# Patient Record
Sex: Female | Born: 1940
Health system: Southern US, Community
[De-identification: ages and names within clinical notes are randomized; demographics above are authoritative.]

## PROBLEM LIST (undated history)

## (undated) DIAGNOSIS — E039 Hypothyroidism, unspecified: Secondary | ICD-10-CM

## (undated) DIAGNOSIS — M1990 Unspecified osteoarthritis, unspecified site: Secondary | ICD-10-CM

## (undated) DIAGNOSIS — Z8711 Personal history of peptic ulcer disease: Secondary | ICD-10-CM

## (undated) DIAGNOSIS — I10 Essential (primary) hypertension: Secondary | ICD-10-CM

## (undated) DIAGNOSIS — E119 Type 2 diabetes mellitus without complications: Secondary | ICD-10-CM

## (undated) DIAGNOSIS — Z8719 Personal history of other diseases of the digestive system: Secondary | ICD-10-CM

## (undated) DIAGNOSIS — E785 Hyperlipidemia, unspecified: Secondary | ICD-10-CM

## (undated) DIAGNOSIS — C189 Malignant neoplasm of colon, unspecified: Secondary | ICD-10-CM

## (undated) DIAGNOSIS — H269 Unspecified cataract: Secondary | ICD-10-CM

## (undated) HISTORY — DX: Hyperlipidemia, unspecified: E78.5

## (undated) HISTORY — PX: REPAIR OF PERFORATED ULCER: SHX6065

## (undated) HISTORY — PX: COLONOSCOPY: SHX174

## (undated) HISTORY — PX: COLON SURGERY: SHX602

## (undated) HISTORY — DX: Type 2 diabetes mellitus without complications: E11.9

## (undated) HISTORY — PX: TOTAL THYROIDECTOMY: SHX2547

## (undated) HISTORY — DX: Unspecified cataract: H26.9

---

## 2000-07-03 ENCOUNTER — Encounter: Payer: Self-pay | Admitting: Family Medicine

## 2000-07-03 ENCOUNTER — Ambulatory Visit (HOSPITAL_COMMUNITY): Admission: RE | Admit: 2000-07-03 | Discharge: 2000-07-03 | Payer: Self-pay | Admitting: Family Medicine

## 2004-01-13 ENCOUNTER — Ambulatory Visit: Payer: Self-pay | Admitting: *Deleted

## 2004-04-16 ENCOUNTER — Ambulatory Visit: Payer: Self-pay | Admitting: Family Medicine

## 2004-04-25 ENCOUNTER — Ambulatory Visit: Payer: Self-pay | Admitting: Family Medicine

## 2004-04-26 ENCOUNTER — Ambulatory Visit (HOSPITAL_COMMUNITY): Admission: RE | Admit: 2004-04-26 | Discharge: 2004-04-26 | Payer: Self-pay | Admitting: Internal Medicine

## 2004-04-27 ENCOUNTER — Ambulatory Visit: Payer: Self-pay | Admitting: Internal Medicine

## 2004-04-29 HISTORY — PX: CRANIOTOMY FOR TUMOR: SUR345

## 2004-05-03 ENCOUNTER — Ambulatory Visit: Payer: Self-pay | Admitting: Family Medicine

## 2004-05-18 ENCOUNTER — Encounter (INDEPENDENT_AMBULATORY_CARE_PROVIDER_SITE_OTHER): Payer: Self-pay | Admitting: Specialist

## 2004-05-18 ENCOUNTER — Inpatient Hospital Stay (HOSPITAL_COMMUNITY): Admission: RE | Admit: 2004-05-18 | Discharge: 2004-05-23 | Payer: Self-pay | Admitting: Neurosurgery

## 2004-06-25 ENCOUNTER — Ambulatory Visit: Payer: Self-pay | Admitting: Family Medicine

## 2004-07-12 ENCOUNTER — Ambulatory Visit: Payer: Self-pay | Admitting: Internal Medicine

## 2004-07-27 ENCOUNTER — Ambulatory Visit (HOSPITAL_COMMUNITY): Admission: RE | Admit: 2004-07-27 | Discharge: 2004-07-27 | Payer: Self-pay | Admitting: Neurosurgery

## 2004-10-08 ENCOUNTER — Ambulatory Visit: Payer: Self-pay | Admitting: Family Medicine

## 2004-10-09 ENCOUNTER — Ambulatory Visit (HOSPITAL_COMMUNITY): Admission: RE | Admit: 2004-10-09 | Discharge: 2004-10-09 | Payer: Self-pay

## 2005-03-04 ENCOUNTER — Ambulatory Visit: Payer: Self-pay | Admitting: Family Medicine

## 2005-05-28 ENCOUNTER — Ambulatory Visit: Payer: Self-pay | Admitting: Family Medicine

## 2005-07-03 ENCOUNTER — Ambulatory Visit: Payer: Self-pay | Admitting: Family Medicine

## 2005-07-29 ENCOUNTER — Ambulatory Visit: Payer: Self-pay | Admitting: Family Medicine

## 2005-08-26 ENCOUNTER — Ambulatory Visit: Payer: Self-pay | Admitting: Internal Medicine

## 2005-10-22 ENCOUNTER — Ambulatory Visit: Payer: Self-pay | Admitting: Family Medicine

## 2005-10-24 ENCOUNTER — Ambulatory Visit (HOSPITAL_COMMUNITY): Admission: RE | Admit: 2005-10-24 | Discharge: 2005-10-24 | Payer: Self-pay | Admitting: Family Medicine

## 2005-11-05 ENCOUNTER — Ambulatory Visit: Payer: Self-pay | Admitting: Family Medicine

## 2005-11-06 ENCOUNTER — Ambulatory Visit: Payer: Self-pay | Admitting: Family Medicine

## 2005-11-21 ENCOUNTER — Ambulatory Visit: Payer: Self-pay | Admitting: Family Medicine

## 2005-11-27 ENCOUNTER — Encounter (INDEPENDENT_AMBULATORY_CARE_PROVIDER_SITE_OTHER): Payer: Self-pay | Admitting: Family Medicine

## 2005-11-27 LAB — CONVERTED CEMR LAB

## 2006-01-02 ENCOUNTER — Ambulatory Visit: Payer: Self-pay | Admitting: Family Medicine

## 2006-01-09 ENCOUNTER — Ambulatory Visit (HOSPITAL_COMMUNITY): Admission: RE | Admit: 2006-01-09 | Discharge: 2006-01-09 | Payer: Self-pay | Admitting: Internal Medicine

## 2006-03-24 ENCOUNTER — Ambulatory Visit: Payer: Self-pay | Admitting: Family Medicine

## 2006-06-26 ENCOUNTER — Ambulatory Visit: Payer: Self-pay | Admitting: Family Medicine

## 2006-09-04 ENCOUNTER — Ambulatory Visit: Payer: Self-pay | Admitting: Family Medicine

## 2006-10-06 ENCOUNTER — Ambulatory Visit: Payer: Self-pay | Admitting: Family Medicine

## 2006-10-06 DIAGNOSIS — D320 Benign neoplasm of cerebral meninges: Secondary | ICD-10-CM | POA: Insufficient documentation

## 2006-10-06 DIAGNOSIS — H401190 Primary open-angle glaucoma, unspecified eye, stage unspecified: Secondary | ICD-10-CM | POA: Insufficient documentation

## 2006-10-06 DIAGNOSIS — E039 Hypothyroidism, unspecified: Secondary | ICD-10-CM | POA: Insufficient documentation

## 2006-10-06 DIAGNOSIS — I10 Essential (primary) hypertension: Secondary | ICD-10-CM | POA: Insufficient documentation

## 2006-10-06 DIAGNOSIS — E119 Type 2 diabetes mellitus without complications: Secondary | ICD-10-CM

## 2006-10-06 DIAGNOSIS — Z9089 Acquired absence of other organs: Secondary | ICD-10-CM

## 2006-10-06 DIAGNOSIS — H4011X0 Primary open-angle glaucoma, stage unspecified: Secondary | ICD-10-CM

## 2006-10-06 DIAGNOSIS — M1710 Unilateral primary osteoarthritis, unspecified knee: Secondary | ICD-10-CM

## 2007-01-14 ENCOUNTER — Encounter (INDEPENDENT_AMBULATORY_CARE_PROVIDER_SITE_OTHER): Payer: Self-pay | Admitting: *Deleted

## 2007-03-09 ENCOUNTER — Ambulatory Visit: Payer: Self-pay | Admitting: Internal Medicine

## 2007-03-09 LAB — CONVERTED CEMR LAB
ALT: 15 units/L (ref 0–35)
BUN: 14 mg/dL (ref 6–23)
Basophils Absolute: 0 10*3/uL (ref 0.0–0.1)
CO2: 25 meq/L (ref 19–32)
Cholesterol: 165 mg/dL (ref 0–200)
Creatinine, Ser: 0.74 mg/dL (ref 0.40–1.20)
Eosinophils Relative: 4 % (ref 0–5)
Glucose, Bld: 92 mg/dL (ref 70–99)
HCT: 37.5 % (ref 36.0–46.0)
HDL: 41 mg/dL (ref >39)
Hemoglobin: 12 g/dL (ref 12.0–15.0)
Lymphocytes Relative: 48 % — ABNORMAL HIGH (ref 12–46)
Monocytes Absolute: 0.4 10*3/uL (ref 0.1–1.0)
Monocytes Relative: 10 % (ref 3–12)
RBC: 4.2 M/uL (ref 3.87–5.11)
RDW: 13.4 % (ref 11.5–15.5)
Total Bilirubin: 0.4 mg/dL (ref 0.3–1.2)
Total CHOL/HDL Ratio: 4
Triglycerides: 306 mg/dL — ABNORMAL HIGH (ref <150)
VLDL: 61 mg/dL — ABNORMAL HIGH (ref 0–40)

## 2007-08-04 ENCOUNTER — Ambulatory Visit: Payer: Self-pay | Admitting: Internal Medicine

## 2007-08-04 ENCOUNTER — Encounter (INDEPENDENT_AMBULATORY_CARE_PROVIDER_SITE_OTHER): Payer: Self-pay | Admitting: Family Medicine

## 2007-10-13 ENCOUNTER — Ambulatory Visit: Payer: Self-pay | Admitting: Family Medicine

## 2007-11-23 ENCOUNTER — Ambulatory Visit: Payer: Self-pay | Admitting: Family Medicine

## 2007-11-23 ENCOUNTER — Encounter (INDEPENDENT_AMBULATORY_CARE_PROVIDER_SITE_OTHER): Payer: Self-pay | Admitting: Family Medicine

## 2007-11-23 LAB — CONVERTED CEMR LAB
BUN: 16 mg/dL (ref 6–23)
CO2: 25 meq/L (ref 19–32)
Calcium: 9.1 mg/dL (ref 8.4–10.5)
Cholesterol: 154 mg/dL (ref 0–200)
Creatinine, Ser: 0.83 mg/dL (ref 0.40–1.20)
Glucose, Bld: 107 mg/dL — ABNORMAL HIGH (ref 70–99)

## 2007-11-30 ENCOUNTER — Ambulatory Visit (HOSPITAL_COMMUNITY): Admission: RE | Admit: 2007-11-30 | Discharge: 2007-11-30 | Payer: Self-pay | Admitting: Family Medicine

## 2008-02-10 ENCOUNTER — Ambulatory Visit: Payer: Self-pay | Admitting: Internal Medicine

## 2008-02-10 ENCOUNTER — Encounter: Payer: Self-pay | Admitting: Family Medicine

## 2008-02-10 LAB — CONVERTED CEMR LAB
BUN: 11 mg/dL (ref 6–23)
Chloride: 100 meq/L (ref 96–112)
Glucose, Bld: 98 mg/dL (ref 70–99)
Potassium: 3.6 meq/L (ref 3.5–5.3)
Sodium: 140 meq/L (ref 135–145)
TSH: 4.657 microintl units/mL — ABNORMAL HIGH (ref 0.350–4.50)

## 2008-03-07 ENCOUNTER — Ambulatory Visit: Payer: Self-pay | Admitting: Family Medicine

## 2008-04-19 ENCOUNTER — Ambulatory Visit: Payer: Self-pay | Admitting: Family Medicine

## 2008-04-19 LAB — CONVERTED CEMR LAB
AST: 15 units/L (ref 0–37)
Albumin: 4 g/dL (ref 3.5–5.2)
BUN: 19 mg/dL (ref 6–23)
Basophils Absolute: 0 10*3/uL (ref 0.0–0.1)
Calcium: 8.8 mg/dL (ref 8.4–10.5)
Chloride: 102 meq/L (ref 96–112)
Creatinine, Ser: 0.79 mg/dL (ref 0.40–1.20)
Eosinophils Relative: 3 % (ref 0–5)
Glucose, Bld: 188 mg/dL — ABNORMAL HIGH (ref 70–99)
HCT: 38.1 % (ref 36.0–46.0)
Hemoglobin: 11.8 g/dL — ABNORMAL LOW (ref 12.0–15.0)
Lymphocytes Relative: 28 % (ref 12–46)
MCHC: 31 g/dL (ref 30.0–36.0)
Monocytes Absolute: 0.4 10*3/uL (ref 0.1–1.0)
Monocytes Relative: 8 % (ref 3–12)
Neutro Abs: 2.8 10*3/uL (ref 1.7–7.7)
Potassium: 3.8 meq/L (ref 3.5–5.3)
RBC: 4.15 M/uL (ref 3.87–5.11)
RDW: 13.1 % (ref 11.5–15.5)
TSH: 5.506 microintl units/mL — ABNORMAL HIGH (ref 0.350–4.50)

## 2008-05-04 ENCOUNTER — Ambulatory Visit: Payer: Self-pay | Admitting: Internal Medicine

## 2008-06-13 ENCOUNTER — Encounter: Payer: Self-pay | Admitting: Family Medicine

## 2008-06-13 ENCOUNTER — Ambulatory Visit: Payer: Self-pay | Admitting: Internal Medicine

## 2008-06-13 LAB — CONVERTED CEMR LAB
TSH: 1.12 microintl units/mL (ref 0.350–4.50)
Vit D, 25-Hydroxy: 15 ng/mL — ABNORMAL LOW (ref 30–89)

## 2008-07-25 ENCOUNTER — Ambulatory Visit: Payer: Self-pay | Admitting: Internal Medicine

## 2008-08-22 ENCOUNTER — Ambulatory Visit: Payer: Self-pay | Admitting: Internal Medicine

## 2008-10-03 ENCOUNTER — Encounter: Payer: Self-pay | Admitting: Family Medicine

## 2008-10-03 ENCOUNTER — Ambulatory Visit: Payer: Self-pay | Admitting: Internal Medicine

## 2008-10-03 LAB — CONVERTED CEMR LAB: Microalb, Ur: 1.03 mg/dL (ref 0.00–1.89)

## 2008-12-21 ENCOUNTER — Ambulatory Visit (HOSPITAL_COMMUNITY): Admission: RE | Admit: 2008-12-21 | Discharge: 2008-12-21 | Payer: Self-pay | Admitting: Internal Medicine

## 2009-01-16 ENCOUNTER — Encounter: Payer: Self-pay | Admitting: Family Medicine

## 2009-01-16 ENCOUNTER — Ambulatory Visit: Payer: Self-pay | Admitting: Internal Medicine

## 2009-01-16 LAB — CONVERTED CEMR LAB
AST: 16 units/L (ref 0–37)
BUN: 14 mg/dL (ref 6–23)
Basophils Absolute: 0 10*3/uL (ref 0.0–0.1)
Basophils Relative: 0 % (ref 0–1)
Calcium: 9 mg/dL (ref 8.4–10.5)
Chloride: 103 meq/L (ref 96–112)
Creatinine, Ser: 0.76 mg/dL (ref 0.40–1.20)
Eosinophils Absolute: 0.1 10*3/uL (ref 0.0–0.7)
MCHC: 32.1 g/dL (ref 30.0–36.0)
MCV: 89.5 fL (ref 78.0–100.0)
Monocytes Relative: 13 % — ABNORMAL HIGH (ref 3–12)
Neutro Abs: 1.7 10*3/uL (ref 1.7–7.7)
Neutrophils Relative %: 41 % — ABNORMAL LOW (ref 43–77)
Platelets: 192 10*3/uL (ref 150–400)
RDW: 12.6 % (ref 11.5–15.5)
Sed Rate: 14 mm/hr (ref 0–22)
Total Bilirubin: 0.4 mg/dL (ref 0.3–1.2)

## 2009-01-23 ENCOUNTER — Ambulatory Visit: Payer: Self-pay | Admitting: Internal Medicine

## 2009-02-02 ENCOUNTER — Ambulatory Visit: Payer: Self-pay | Admitting: Family Medicine

## 2009-05-03 ENCOUNTER — Ambulatory Visit: Payer: Self-pay | Admitting: Internal Medicine

## 2009-07-20 ENCOUNTER — Ambulatory Visit: Payer: Self-pay | Admitting: Internal Medicine

## 2009-10-02 ENCOUNTER — Ambulatory Visit: Payer: Self-pay | Admitting: Internal Medicine

## 2009-10-02 LAB — CONVERTED CEMR LAB
ALT: 15 units/L (ref 0–35)
CO2: 25 meq/L (ref 19–32)
Calcium: 8.8 mg/dL (ref 8.4–10.5)
Chloride: 105 meq/L (ref 96–112)
Cholesterol: 153 mg/dL (ref 0–200)
Creatinine, Ser: 0.67 mg/dL (ref 0.40–1.20)
Glucose, Bld: 135 mg/dL — ABNORMAL HIGH (ref 70–99)
Total Protein: 8.2 g/dL (ref 6.0–8.3)
Triglycerides: 107 mg/dL (ref <150)

## 2009-10-20 ENCOUNTER — Ambulatory Visit: Payer: Self-pay | Admitting: Internal Medicine

## 2009-10-31 ENCOUNTER — Ambulatory Visit: Payer: Self-pay | Admitting: Internal Medicine

## 2009-12-25 ENCOUNTER — Ambulatory Visit (HOSPITAL_COMMUNITY): Admission: RE | Admit: 2009-12-25 | Discharge: 2009-12-25 | Payer: Self-pay | Admitting: Family Medicine

## 2010-09-14 NOTE — Op Note (Signed)
Heather [name]:  [id number]   MEDICAL RECORD NO.:  [id number]          PATIENT TYPE:  INP   LOCATION:  2899                         FACILITY:  [facility]   PHYSICIAN:  Danae Orleans. Venetia Maxon, M.D.  DATE OF BIRTH:  07-May-1940   DATE OF PROCEDURE:  05/18/2004  DATE OF DISCHARGE:                                 OPERATIVE REPORT   PREOPERATIVE DIAGNOSIS:  Left sphenoid wing meningioma.   POSTOPERATIVE DIAGNOSIS:  Left sphenoid wing meningioma.   PROCEDURE:  Left temporal craniotomy for meningioma with microdissection.   SURGEON:  Danae Orleans. Venetia Maxon, M.D.   ASSISTANT:  Hewitt Shorts, M.D.   ANESTHESIA:  General endotracheal anesthesia.   ESTIMATED BLOOD LOSS:  600 mL.   COMPLICATIONS:  None.   DISPOSITION:  To recovery.   INDICATIONS:  Heather Wilcox is a 71 year old woman with hypertension with  left facial droop and the beginning of ptosis, who had an MRI of her brain,  which demonstrated a large sphenoid wing meningioma on the left.  In  addition, she has high blood pressure and no other significant medical  problems.   It was elected to take Heather Wilcox to surgery for removal of the left  sphenoid wing meningioma.   PROCEDURE:  Heather Wilcox was brought to the operating room.  Following the  satisfactory and uncomplicated induction of general endotracheal anesthesia  and placement of intravenous lines and arterial line, the patient was placed  in three-pin head fixation with the left pterion uppermost and with a  blanket roll under her left shoulder.  Her left frontotemporal region was  then prepped and draped in the usual sterile fashion.  The area of planned  incision was infiltrated with 0.25% Marcaine and 0.5% lidocaine and  1:200,000 epinephrine.  Incision was made from just in front of the ear to  overlying the frontal scalp, carried through the temporalis fascia, and the  temporalis muscle was also incised and moved and dissected forward en  bloc  with the scalp flap.  Towel clips were placed to facilitate exposure.  Three  bur holes were placed, one over the temporal squama, one at the keyhole, and  one over the superior temporal line posteriorly, and a bone flap was turned.  The bone was quite vascular, and bleeding was controlled with bone wax.  A  very hypertrophied middle meningeal artery was cauterized with bipolar  electrocautery.  Subsequently, the temporal tip dura was exposed with  craniectomy to the floor of the middle fossa.  There were some pneumatized  air cells over the inferior edge of bone resection along the anterior aspect  near the middle fossa, and these were extensively waxed.  Using a high-speed  drill and a Architectural technologist, the hyperostotic sphenoid  wing was drilled down.  Bone wax was used to facilitate hemostasis.  Eventually the sphenoid wing was taken down satisfactorily extradurally, and  subsequently the dura was opened in a curvilinear fashion and reflected  forward.  The Budde halo was assembled and a temporal and frontal retractor  blade were used to facilitate exposure.  There was a filmy arachnoid and  multiple small vascular connections to the tumor.  These were taken down  with bipolar electrocautery and microscissors.  The tumor capsule was  identified.  It was quite soft, and multiple biopsies were taken.  Using  suction cautery technique, the tumor capsule was mobilized and the tumor was  debulked internally.  There was a middle cerebral artery, which was adherent  to the capsule and appeared to be __________, and this was very carefully  dissected free and preserved.  There were several branches directly into the  tumor, which were cauterized with bipolar electrocautery and taken.  The  tumor was then removed completely.  The dural attachment was then cauterized  with bipolar electrocautery.  The hemostasis was assured, and the cavity of  the tumor resection was lined  with Surgicel.  The dura was then closed with  4-0 Nurolon stitches.  Dural tack-up stitches were placed.  A piece of  Gelfoam was placed over the dura.  The bone flap was then repositioned with  Osteomed plates and screws.  The temporalis fascia was reapproximated with 2-  0 Vicryl sutures.  The galea was reapproximated with 2-0 Vicryl interrupted,  inverted sutures.  The skin edges were reapproximated with staples.  A  sterile occlusive dressing was placed along with head wrap.  The patient was  extubated in the operating room and taken to the recovery room in stable and  satisfactory condition, having tolerated her operation well.  Counts correct  at the end of the case.      JDS/MEDQ  D:  05/18/2004  T:  05/18/2004  Job:  [id number]

## 2010-09-14 NOTE — Procedures (Signed)
CONCLUSION:  Normal EEG in the waking and drowsy states without seizure  activity or focal abnormality during the course of today's recording; EKG  revealed a mild bradycardia in the low 60s. Clinical correlation is  recommended.       ZOX:WRUE  D:  10/09/2004 12:30:39  T:  10/09/2004 13:34:47  Job #:  [id number]   cc:   Tresa Endo L. Philipp Deputy, M.D.  [phone] [address] [place]  [place] [address]  Fax: [phone]

## 2010-09-14 NOTE — Discharge Summary (Signed)
[name], [name]:  [id number]   MEDICAL RECORD NO.:  [id number]          PATIENT TYPE:  INP   LOCATION:  3031                         FACILITY:  [facility]   PHYSICIAN:  Danae Orleans. Venetia Maxon, M.D.  DATE OF BIRTH:  [birth year]   DATE OF ADMISSION:  05/18/2004  DATE OF DISCHARGE:  05/23/2004                                 DISCHARGE SUMMARY   REASON FOR ADMISSION:  Benign cerebral neoplasm of the meninges  (meningioma).   ADDITIONAL DIAGNOSES:  1.  Tobacco use disorder.  2.  Hypertension.  3.  History of penicillin allergy.  4.  Facial weakness.  5.  Esophageal reflux.  6.  Ptosis of the eyelid.   FINAL DIAGNOSES:  1.  Benign cerebral neoplasm of the meninges (meningioma).  2.  Tobacco use disorder.  3.  Hypertension.  4.  History of penicillin allergy.  5.  Facial weakness.  6.  Esophageal reflux.  7.  Ptosis of the eyelid.   HISTORY OF PRESENT ILLNESS:  Heather Wilcox is a 70 year old woman who was  noted to have a left sphenoid wing meningioma.  She has a two-year history  of left-sided facial twitching and sagging.  She says that her eye jumps.  She had an MRI of her brain, which showed that she had a left sphenoid wing  meningioma measuring approximately 3 cm in diameter with a fair amount of  peritumoral edema.   ALLERGIES:  She has a history of PENICILLIN allergy.   MEDICATIONS:  She takes Prinizide, Surveyor, mining and metoprolol.  She takes  reserpine for elevated blood pressure.  She was started on a dexamethasone  taper when the tumor was identified and was also started on Dilantin for  prophylaxis against potential seizure.   HOSPITAL COURSE:  The patient was admitted to the hospital and taken to  surgery on a same day as procedure basis for craniotomy for left sphenoid  wing meningioma.  She tolerated the surgical procedure well, was gradually  mobilized and had intact vision.  No cranial nerve deficits postoperatively.  She was gradually mobilized  and was doing well.   DISPOSITION:  She was then discharged from the 3000 inpatient floor unit on  May 23, 2004.   DISCHARGE MEDICATIONS:  Dilantin, Percocet and Decadron taper.   FOLLOWUP:  Instructions to follow up with Dr. Venetia Maxon in the office in one  week for suture removal.      JDS/MEDQ  D:  07/19/2004  T:  07/19/2004  Job:  [id number]

## 2010-12-24 ENCOUNTER — Other Ambulatory Visit (HOSPITAL_COMMUNITY): Payer: Self-pay | Admitting: Family Medicine

## 2010-12-24 DIAGNOSIS — Z1231 Encounter for screening mammogram for malignant neoplasm of breast: Secondary | ICD-10-CM

## 2010-12-28 ENCOUNTER — Ambulatory Visit (HOSPITAL_COMMUNITY)
Admission: RE | Admit: 2010-12-28 | Discharge: 2010-12-28 | Disposition: A | Discharge status: Home / Self Care | Payer: PRIVATE HEALTH INSURANCE | Source: Ambulatory Visit | Attending: Family Medicine | Admitting: Family Medicine

## 2010-12-28 DIAGNOSIS — Z1231 Encounter for screening mammogram for malignant neoplasm of breast: Secondary | ICD-10-CM | POA: Insufficient documentation

## 2011-12-12 ENCOUNTER — Other Ambulatory Visit (HOSPITAL_COMMUNITY): Payer: Self-pay | Admitting: Family Medicine

## 2011-12-12 DIAGNOSIS — Z1231 Encounter for screening mammogram for malignant neoplasm of breast: Secondary | ICD-10-CM

## 2012-01-03 ENCOUNTER — Ambulatory Visit (HOSPITAL_COMMUNITY)
Admission: RE | Admit: 2012-01-03 | Discharge: 2012-01-03 | Disposition: A | Discharge status: Home / Self Care | Payer: PRIVATE HEALTH INSURANCE | Source: Ambulatory Visit | Attending: Family Medicine | Admitting: Family Medicine

## 2012-01-03 DIAGNOSIS — Z1231 Encounter for screening mammogram for malignant neoplasm of breast: Secondary | ICD-10-CM | POA: Insufficient documentation

## 2012-12-10 ENCOUNTER — Other Ambulatory Visit (HOSPITAL_COMMUNITY): Payer: Self-pay | Admitting: Internal Medicine

## 2012-12-10 DIAGNOSIS — Z1231 Encounter for screening mammogram for malignant neoplasm of breast: Secondary | ICD-10-CM

## 2013-01-04 ENCOUNTER — Ambulatory Visit (HOSPITAL_COMMUNITY)
Admission: RE | Admit: 2013-01-04 | Discharge: 2013-01-04 | Disposition: A | Discharge status: Home / Self Care | Payer: PRIVATE HEALTH INSURANCE | Source: Ambulatory Visit | Attending: Internal Medicine | Admitting: Internal Medicine

## 2013-01-04 ENCOUNTER — Other Ambulatory Visit (HOSPITAL_COMMUNITY): Payer: Self-pay | Admitting: Internal Medicine

## 2013-01-04 DIAGNOSIS — Z1231 Encounter for screening mammogram for malignant neoplasm of breast: Secondary | ICD-10-CM | POA: Insufficient documentation

## 2013-12-06 ENCOUNTER — Other Ambulatory Visit (HOSPITAL_COMMUNITY): Payer: Self-pay | Admitting: Internal Medicine

## 2013-12-06 DIAGNOSIS — Z1231 Encounter for screening mammogram for malignant neoplasm of breast: Secondary | ICD-10-CM

## 2014-01-05 ENCOUNTER — Ambulatory Visit (HOSPITAL_COMMUNITY)
Admission: RE | Admit: 2014-01-05 | Discharge: 2014-01-05 | Disposition: A | Discharge status: Home / Self Care | Payer: PRIVATE HEALTH INSURANCE | Source: Ambulatory Visit | Attending: Internal Medicine | Admitting: Internal Medicine

## 2014-01-05 DIAGNOSIS — Z1231 Encounter for screening mammogram for malignant neoplasm of breast: Secondary | ICD-10-CM | POA: Diagnosis present

## 2014-05-27 DIAGNOSIS — H35363 Drusen (degenerative) of macula, bilateral: Secondary | ICD-10-CM | POA: Diagnosis not present

## 2014-06-07 DIAGNOSIS — E7211 Homocystinuria: Secondary | ICD-10-CM | POA: Diagnosis not present

## 2014-06-07 DIAGNOSIS — I119 Hypertensive heart disease without heart failure: Secondary | ICD-10-CM | POA: Diagnosis not present

## 2014-06-07 DIAGNOSIS — I10 Essential (primary) hypertension: Secondary | ICD-10-CM | POA: Diagnosis not present

## 2014-06-07 DIAGNOSIS — E039 Hypothyroidism, unspecified: Secondary | ICD-10-CM | POA: Diagnosis not present

## 2014-06-07 DIAGNOSIS — R7309 Other abnormal glucose: Secondary | ICD-10-CM | POA: Diagnosis not present

## 2014-06-07 DIAGNOSIS — Z0000 Encounter for general adult medical examination without abnormal findings: Secondary | ICD-10-CM | POA: Diagnosis not present

## 2014-07-20 DIAGNOSIS — N760 Acute vaginitis: Secondary | ICD-10-CM | POA: Diagnosis not present

## 2014-07-20 DIAGNOSIS — I10 Essential (primary) hypertension: Secondary | ICD-10-CM | POA: Diagnosis not present

## 2014-07-20 DIAGNOSIS — Z124 Encounter for screening for malignant neoplasm of cervix: Secondary | ICD-10-CM | POA: Diagnosis not present

## 2014-07-20 DIAGNOSIS — R7309 Other abnormal glucose: Secondary | ICD-10-CM | POA: Diagnosis not present

## 2014-07-20 DIAGNOSIS — I119 Hypertensive heart disease without heart failure: Secondary | ICD-10-CM | POA: Diagnosis not present

## 2014-07-20 DIAGNOSIS — L0390 Cellulitis, unspecified: Secondary | ICD-10-CM | POA: Diagnosis not present

## 2014-10-27 DIAGNOSIS — E039 Hypothyroidism, unspecified: Secondary | ICD-10-CM | POA: Diagnosis not present

## 2014-10-27 DIAGNOSIS — I10 Essential (primary) hypertension: Secondary | ICD-10-CM | POA: Diagnosis not present

## 2014-10-27 DIAGNOSIS — E7211 Homocystinuria: Secondary | ICD-10-CM | POA: Diagnosis not present

## 2014-10-27 DIAGNOSIS — R7309 Other abnormal glucose: Secondary | ICD-10-CM | POA: Diagnosis not present

## 2014-10-27 DIAGNOSIS — I119 Hypertensive heart disease without heart failure: Secondary | ICD-10-CM | POA: Diagnosis not present

## 2014-12-20 ENCOUNTER — Other Ambulatory Visit (HOSPITAL_COMMUNITY): Payer: Self-pay | Admitting: Internal Medicine

## 2014-12-20 DIAGNOSIS — Z1231 Encounter for screening mammogram for malignant neoplasm of breast: Secondary | ICD-10-CM

## 2015-01-09 ENCOUNTER — Ambulatory Visit (HOSPITAL_COMMUNITY): Payer: Medicare Other

## 2015-01-16 ENCOUNTER — Ambulatory Visit (HOSPITAL_COMMUNITY)
Admission: RE | Admit: 2015-01-16 | Discharge: 2015-01-16 | Disposition: A | Discharge status: Home / Self Care | Payer: Medicaid Other | Source: Ambulatory Visit | Attending: Internal Medicine | Admitting: Internal Medicine

## 2015-01-16 DIAGNOSIS — Z1231 Encounter for screening mammogram for malignant neoplasm of breast: Secondary | ICD-10-CM | POA: Diagnosis not present

## 2015-01-18 DIAGNOSIS — E039 Hypothyroidism, unspecified: Secondary | ICD-10-CM | POA: Diagnosis not present

## 2015-01-18 DIAGNOSIS — I10 Essential (primary) hypertension: Secondary | ICD-10-CM | POA: Diagnosis not present

## 2015-01-18 DIAGNOSIS — I119 Hypertensive heart disease without heart failure: Secondary | ICD-10-CM | POA: Diagnosis not present

## 2015-01-18 DIAGNOSIS — R7309 Other abnormal glucose: Secondary | ICD-10-CM | POA: Diagnosis not present

## 2015-01-18 DIAGNOSIS — E7211 Homocystinuria: Secondary | ICD-10-CM | POA: Diagnosis not present

## 2015-01-18 DIAGNOSIS — Z720 Tobacco use: Secondary | ICD-10-CM | POA: Diagnosis not present

## 2015-01-18 DIAGNOSIS — Z7689 Persons encountering health services in other specified circumstances: Secondary | ICD-10-CM | POA: Diagnosis not present

## 2015-04-19 DIAGNOSIS — H1089 Other conjunctivitis: Secondary | ICD-10-CM | POA: Diagnosis not present

## 2015-04-19 DIAGNOSIS — E7211 Homocystinuria: Secondary | ICD-10-CM | POA: Diagnosis not present

## 2015-04-19 DIAGNOSIS — J309 Allergic rhinitis, unspecified: Secondary | ICD-10-CM | POA: Diagnosis not present

## 2015-04-19 DIAGNOSIS — I10 Essential (primary) hypertension: Secondary | ICD-10-CM | POA: Diagnosis not present

## 2015-04-19 DIAGNOSIS — E119 Type 2 diabetes mellitus without complications: Secondary | ICD-10-CM | POA: Diagnosis not present

## 2015-04-19 DIAGNOSIS — E559 Vitamin D deficiency, unspecified: Secondary | ICD-10-CM | POA: Diagnosis not present

## 2015-05-10 DIAGNOSIS — Z1389 Encounter for screening for other disorder: Secondary | ICD-10-CM | POA: Diagnosis not present

## 2015-05-10 DIAGNOSIS — E7211 Homocystinuria: Secondary | ICD-10-CM | POA: Diagnosis not present

## 2015-05-10 DIAGNOSIS — E119 Type 2 diabetes mellitus without complications: Secondary | ICD-10-CM | POA: Diagnosis not present

## 2015-05-10 DIAGNOSIS — Z0000 Encounter for general adult medical examination without abnormal findings: Secondary | ICD-10-CM | POA: Diagnosis not present

## 2015-05-10 DIAGNOSIS — Z131 Encounter for screening for diabetes mellitus: Secondary | ICD-10-CM | POA: Diagnosis not present

## 2015-05-10 DIAGNOSIS — E559 Vitamin D deficiency, unspecified: Secondary | ICD-10-CM | POA: Diagnosis not present

## 2015-05-10 DIAGNOSIS — Z01118 Encounter for examination of ears and hearing with other abnormal findings: Secondary | ICD-10-CM | POA: Diagnosis not present

## 2015-05-26 DIAGNOSIS — H35363 Drusen (degenerative) of macula, bilateral: Secondary | ICD-10-CM | POA: Diagnosis not present

## 2015-06-23 DIAGNOSIS — I10 Essential (primary) hypertension: Secondary | ICD-10-CM | POA: Diagnosis not present

## 2015-06-23 DIAGNOSIS — E7211 Homocystinuria: Secondary | ICD-10-CM | POA: Diagnosis not present

## 2015-07-27 DIAGNOSIS — I10 Essential (primary) hypertension: Secondary | ICD-10-CM | POA: Diagnosis not present

## 2015-07-27 DIAGNOSIS — E7211 Homocystinuria: Secondary | ICD-10-CM | POA: Diagnosis not present

## 2015-08-03 DIAGNOSIS — I10 Essential (primary) hypertension: Secondary | ICD-10-CM | POA: Diagnosis not present

## 2015-08-03 DIAGNOSIS — J309 Allergic rhinitis, unspecified: Secondary | ICD-10-CM | POA: Diagnosis not present

## 2015-08-03 DIAGNOSIS — E7211 Homocystinuria: Secondary | ICD-10-CM | POA: Diagnosis not present

## 2015-08-03 DIAGNOSIS — L729 Follicular cyst of the skin and subcutaneous tissue, unspecified: Secondary | ICD-10-CM | POA: Diagnosis not present

## 2015-08-03 DIAGNOSIS — E119 Type 2 diabetes mellitus without complications: Secondary | ICD-10-CM | POA: Diagnosis not present

## 2015-08-10 DIAGNOSIS — I10 Essential (primary) hypertension: Secondary | ICD-10-CM | POA: Diagnosis not present

## 2015-08-10 DIAGNOSIS — E7211 Homocystinuria: Secondary | ICD-10-CM | POA: Diagnosis not present

## 2015-08-31 DIAGNOSIS — I10 Essential (primary) hypertension: Secondary | ICD-10-CM | POA: Diagnosis not present

## 2015-08-31 DIAGNOSIS — F439 Reaction to severe stress, unspecified: Secondary | ICD-10-CM | POA: Diagnosis not present

## 2015-08-31 DIAGNOSIS — E119 Type 2 diabetes mellitus without complications: Secondary | ICD-10-CM | POA: Diagnosis not present

## 2015-08-31 DIAGNOSIS — L089 Local infection of the skin and subcutaneous tissue, unspecified: Secondary | ICD-10-CM | POA: Diagnosis not present

## 2015-08-31 DIAGNOSIS — E7211 Homocystinuria: Secondary | ICD-10-CM | POA: Diagnosis not present

## 2015-09-07 DIAGNOSIS — E7211 Homocystinuria: Secondary | ICD-10-CM | POA: Diagnosis not present

## 2015-09-07 DIAGNOSIS — I10 Essential (primary) hypertension: Secondary | ICD-10-CM | POA: Diagnosis not present

## 2015-10-03 DIAGNOSIS — E7211 Homocystinuria: Secondary | ICD-10-CM | POA: Diagnosis not present

## 2015-10-03 DIAGNOSIS — I10 Essential (primary) hypertension: Secondary | ICD-10-CM | POA: Diagnosis not present

## 2015-11-06 DIAGNOSIS — E7211 Homocystinuria: Secondary | ICD-10-CM | POA: Diagnosis not present

## 2015-11-06 DIAGNOSIS — I10 Essential (primary) hypertension: Secondary | ICD-10-CM | POA: Diagnosis not present

## 2015-12-06 DIAGNOSIS — I10 Essential (primary) hypertension: Secondary | ICD-10-CM | POA: Diagnosis not present

## 2015-12-06 DIAGNOSIS — E7211 Homocystinuria: Secondary | ICD-10-CM | POA: Diagnosis not present

## 2015-12-08 DIAGNOSIS — G4709 Other insomnia: Secondary | ICD-10-CM | POA: Diagnosis not present

## 2015-12-08 DIAGNOSIS — M1712 Unilateral primary osteoarthritis, left knee: Secondary | ICD-10-CM | POA: Diagnosis not present

## 2015-12-08 DIAGNOSIS — I10 Essential (primary) hypertension: Secondary | ICD-10-CM | POA: Diagnosis not present

## 2015-12-08 DIAGNOSIS — E039 Hypothyroidism, unspecified: Secondary | ICD-10-CM | POA: Diagnosis not present

## 2015-12-08 DIAGNOSIS — Z0000 Encounter for general adult medical examination without abnormal findings: Secondary | ICD-10-CM | POA: Diagnosis not present

## 2015-12-18 DIAGNOSIS — I10 Essential (primary) hypertension: Secondary | ICD-10-CM | POA: Diagnosis not present

## 2015-12-18 DIAGNOSIS — M1712 Unilateral primary osteoarthritis, left knee: Secondary | ICD-10-CM | POA: Diagnosis not present

## 2015-12-18 DIAGNOSIS — G4709 Other insomnia: Secondary | ICD-10-CM | POA: Diagnosis not present

## 2015-12-18 DIAGNOSIS — E119 Type 2 diabetes mellitus without complications: Secondary | ICD-10-CM | POA: Diagnosis not present

## 2015-12-18 DIAGNOSIS — E039 Hypothyroidism, unspecified: Secondary | ICD-10-CM | POA: Diagnosis not present

## 2015-12-26 ENCOUNTER — Other Ambulatory Visit: Payer: Self-pay | Admitting: Internal Medicine

## 2015-12-26 DIAGNOSIS — Z1231 Encounter for screening mammogram for malignant neoplasm of breast: Secondary | ICD-10-CM

## 2016-01-09 DIAGNOSIS — E119 Type 2 diabetes mellitus without complications: Secondary | ICD-10-CM | POA: Diagnosis not present

## 2016-01-09 DIAGNOSIS — E039 Hypothyroidism, unspecified: Secondary | ICD-10-CM | POA: Diagnosis not present

## 2016-01-09 DIAGNOSIS — G4709 Other insomnia: Secondary | ICD-10-CM | POA: Diagnosis not present

## 2016-01-09 DIAGNOSIS — M1712 Unilateral primary osteoarthritis, left knee: Secondary | ICD-10-CM | POA: Diagnosis not present

## 2016-01-09 DIAGNOSIS — E876 Hypokalemia: Secondary | ICD-10-CM | POA: Diagnosis not present

## 2016-01-09 DIAGNOSIS — I10 Essential (primary) hypertension: Secondary | ICD-10-CM | POA: Diagnosis not present

## 2016-01-19 ENCOUNTER — Ambulatory Visit: Payer: Medicare Other

## 2016-01-26 DIAGNOSIS — E7211 Homocystinuria: Secondary | ICD-10-CM | POA: Diagnosis not present

## 2016-01-26 DIAGNOSIS — I10 Essential (primary) hypertension: Secondary | ICD-10-CM | POA: Diagnosis not present

## 2016-01-29 DIAGNOSIS — I10 Essential (primary) hypertension: Secondary | ICD-10-CM | POA: Diagnosis not present

## 2016-01-29 DIAGNOSIS — Q829 Congenital malformation of skin, unspecified: Secondary | ICD-10-CM | POA: Diagnosis not present

## 2016-01-29 DIAGNOSIS — G4709 Other insomnia: Secondary | ICD-10-CM | POA: Diagnosis not present

## 2016-01-29 DIAGNOSIS — E876 Hypokalemia: Secondary | ICD-10-CM | POA: Diagnosis not present

## 2016-01-29 DIAGNOSIS — E039 Hypothyroidism, unspecified: Secondary | ICD-10-CM | POA: Diagnosis not present

## 2016-01-29 DIAGNOSIS — E119 Type 2 diabetes mellitus without complications: Secondary | ICD-10-CM | POA: Diagnosis not present

## 2016-01-30 ENCOUNTER — Ambulatory Visit
Admission: RE | Admit: 2016-01-30 | Discharge: 2016-01-30 | Disposition: A | Discharge status: Home / Self Care | Payer: Medicare Other | Source: Ambulatory Visit | Attending: Internal Medicine | Admitting: Internal Medicine

## 2016-01-30 DIAGNOSIS — Z1231 Encounter for screening mammogram for malignant neoplasm of breast: Secondary | ICD-10-CM | POA: Diagnosis not present

## 2016-02-14 DIAGNOSIS — E876 Hypokalemia: Secondary | ICD-10-CM | POA: Diagnosis not present

## 2016-02-14 DIAGNOSIS — E119 Type 2 diabetes mellitus without complications: Secondary | ICD-10-CM | POA: Diagnosis not present

## 2016-02-14 DIAGNOSIS — G4709 Other insomnia: Secondary | ICD-10-CM | POA: Diagnosis not present

## 2016-02-14 DIAGNOSIS — E039 Hypothyroidism, unspecified: Secondary | ICD-10-CM | POA: Diagnosis not present

## 2016-02-14 DIAGNOSIS — I10 Essential (primary) hypertension: Secondary | ICD-10-CM | POA: Diagnosis not present

## 2016-02-14 DIAGNOSIS — Q829 Congenital malformation of skin, unspecified: Secondary | ICD-10-CM | POA: Diagnosis not present

## 2016-02-23 DIAGNOSIS — I10 Essential (primary) hypertension: Secondary | ICD-10-CM | POA: Diagnosis not present

## 2016-02-23 DIAGNOSIS — E7211 Homocystinuria: Secondary | ICD-10-CM | POA: Diagnosis not present

## 2016-03-12 DIAGNOSIS — K625 Hemorrhage of anus and rectum: Secondary | ICD-10-CM | POA: Diagnosis not present

## 2016-03-22 DIAGNOSIS — E7211 Homocystinuria: Secondary | ICD-10-CM | POA: Diagnosis not present

## 2016-03-22 DIAGNOSIS — I10 Essential (primary) hypertension: Secondary | ICD-10-CM | POA: Diagnosis not present

## 2016-04-25 DIAGNOSIS — E7211 Homocystinuria: Secondary | ICD-10-CM | POA: Diagnosis not present

## 2016-04-25 DIAGNOSIS — I10 Essential (primary) hypertension: Secondary | ICD-10-CM | POA: Diagnosis not present

## 2016-05-10 DIAGNOSIS — Z0000 Encounter for general adult medical examination without abnormal findings: Secondary | ICD-10-CM | POA: Diagnosis not present

## 2016-05-10 DIAGNOSIS — E119 Type 2 diabetes mellitus without complications: Secondary | ICD-10-CM | POA: Diagnosis not present

## 2016-05-10 DIAGNOSIS — I10 Essential (primary) hypertension: Secondary | ICD-10-CM | POA: Diagnosis not present

## 2016-05-10 DIAGNOSIS — M1712 Unilateral primary osteoarthritis, left knee: Secondary | ICD-10-CM | POA: Diagnosis not present

## 2016-05-10 DIAGNOSIS — G4709 Other insomnia: Secondary | ICD-10-CM | POA: Diagnosis not present

## 2016-05-10 DIAGNOSIS — E039 Hypothyroidism, unspecified: Secondary | ICD-10-CM | POA: Diagnosis not present

## 2016-05-30 ENCOUNTER — Inpatient Hospital Stay (HOSPITAL_COMMUNITY)
Admission: EM | Admit: 2016-05-30 | Discharge: 2016-06-02 | DRG: 194 | Disposition: A | Discharge status: Home Health | Payer: Medicare Other | Attending: Internal Medicine | Admitting: Internal Medicine

## 2016-05-30 ENCOUNTER — Encounter (HOSPITAL_COMMUNITY): Payer: Self-pay | Admitting: Family Medicine

## 2016-05-30 ENCOUNTER — Emergency Department (HOSPITAL_COMMUNITY): Payer: Medicare Other

## 2016-05-30 DIAGNOSIS — D72819 Decreased white blood cell count, unspecified: Secondary | ICD-10-CM | POA: Diagnosis present

## 2016-05-30 DIAGNOSIS — I160 Hypertensive urgency: Secondary | ICD-10-CM | POA: Diagnosis present

## 2016-05-30 DIAGNOSIS — J101 Influenza due to other identified influenza virus with other respiratory manifestations: Secondary | ICD-10-CM | POA: Diagnosis present

## 2016-05-30 DIAGNOSIS — F17210 Nicotine dependence, cigarettes, uncomplicated: Secondary | ICD-10-CM | POA: Diagnosis present

## 2016-05-30 DIAGNOSIS — R7401 Elevation of levels of liver transaminase levels: Secondary | ICD-10-CM | POA: Diagnosis present

## 2016-05-30 DIAGNOSIS — Z8711 Personal history of peptic ulcer disease: Secondary | ICD-10-CM | POA: Diagnosis not present

## 2016-05-30 DIAGNOSIS — I6789 Other cerebrovascular disease: Secondary | ICD-10-CM | POA: Diagnosis not present

## 2016-05-30 DIAGNOSIS — R4781 Slurred speech: Secondary | ICD-10-CM | POA: Diagnosis not present

## 2016-05-30 DIAGNOSIS — E872 Acidosis: Secondary | ICD-10-CM | POA: Diagnosis not present

## 2016-05-30 DIAGNOSIS — R197 Diarrhea, unspecified: Secondary | ICD-10-CM | POA: Diagnosis present

## 2016-05-30 DIAGNOSIS — E039 Hypothyroidism, unspecified: Secondary | ICD-10-CM | POA: Diagnosis not present

## 2016-05-30 DIAGNOSIS — D696 Thrombocytopenia, unspecified: Secondary | ICD-10-CM | POA: Diagnosis present

## 2016-05-30 DIAGNOSIS — E876 Hypokalemia: Secondary | ICD-10-CM | POA: Diagnosis not present

## 2016-05-30 DIAGNOSIS — Z79899 Other long term (current) drug therapy: Secondary | ICD-10-CM | POA: Diagnosis not present

## 2016-05-30 DIAGNOSIS — R0902 Hypoxemia: Secondary | ICD-10-CM | POA: Diagnosis not present

## 2016-05-30 DIAGNOSIS — R531 Weakness: Secondary | ICD-10-CM | POA: Diagnosis not present

## 2016-05-30 DIAGNOSIS — Z86011 Personal history of benign neoplasm of the brain: Secondary | ICD-10-CM | POA: Diagnosis not present

## 2016-05-30 DIAGNOSIS — R740 Nonspecific elevation of levels of transaminase and lactic acid dehydrogenase [LDH]: Secondary | ICD-10-CM | POA: Diagnosis not present

## 2016-05-30 DIAGNOSIS — I10 Essential (primary) hypertension: Secondary | ICD-10-CM | POA: Diagnosis present

## 2016-05-30 DIAGNOSIS — J111 Influenza due to unidentified influenza virus with other respiratory manifestations: Secondary | ICD-10-CM | POA: Diagnosis present

## 2016-05-30 DIAGNOSIS — D72810 Lymphocytopenia: Secondary | ICD-10-CM | POA: Diagnosis not present

## 2016-05-30 DIAGNOSIS — E860 Dehydration: Secondary | ICD-10-CM | POA: Diagnosis not present

## 2016-05-30 DIAGNOSIS — R69 Illness, unspecified: Secondary | ICD-10-CM

## 2016-05-30 DIAGNOSIS — R05 Cough: Secondary | ICD-10-CM | POA: Diagnosis not present

## 2016-05-30 HISTORY — DX: Hypothyroidism, unspecified: E03.9

## 2016-05-30 HISTORY — DX: Personal history of peptic ulcer disease: Z87.11

## 2016-05-30 HISTORY — DX: Personal history of other diseases of the digestive system: Z87.19

## 2016-05-30 HISTORY — DX: Essential (primary) hypertension: I10

## 2016-05-30 LAB — COMPREHENSIVE METABOLIC PANEL
ALBUMIN: 3.8 g/dL (ref 3.5–5.0)
ALT: 52 U/L (ref 14–54)
ANION GAP: 11 (ref 5–15)
AST: 127 U/L — ABNORMAL HIGH (ref 15–41)
Alkaline Phosphatase: 48 U/L (ref 38–126)
BUN: 20 mg/dL (ref 6–20)
CO2: 26 mmol/L (ref 22–32)
Calcium: 9 mg/dL (ref 8.9–10.3)
Chloride: 102 mmol/L (ref 101–111)
Creatinine, Ser: 1.02 mg/dL — ABNORMAL HIGH (ref 0.44–1.00)
GFR calc Af Amer: >60 mL/min (ref >60)
GFR calc non Af Amer: 53 mL/min — ABNORMAL LOW (ref >60)
GLUCOSE: 130 mg/dL — AB (ref 65–99)
POTASSIUM: 2.6 mmol/L — AB (ref 3.5–5.1)
SODIUM: 139 mmol/L (ref 135–145)
Total Bilirubin: 0.9 mg/dL (ref 0.3–1.2)
Total Protein: 8.6 g/dL — ABNORMAL HIGH (ref 6.5–8.1)

## 2016-05-30 LAB — CBC WITH DIFFERENTIAL/PLATELET
BASOS ABS: 0 10*3/uL (ref 0.0–0.1)
Basophils Relative: 0 %
Eosinophils Absolute: 0 10*3/uL (ref 0.0–0.7)
Eosinophils Relative: 0 %
HCT: 42 % (ref 36.0–46.0)
Hemoglobin: 13.7 g/dL (ref 12.0–15.0)
LYMPHS PCT: 9 %
Lymphs Abs: 0.3 10*3/uL — ABNORMAL LOW (ref 0.7–4.0)
MCH: 30.2 pg (ref 26.0–34.0)
MCHC: 32.6 g/dL (ref 30.0–36.0)
MCV: 92.5 fL (ref 78.0–100.0)
MONO ABS: 0.1 10*3/uL (ref 0.1–1.0)
MONOS PCT: 2 %
NEUTROS ABS: 3.1 10*3/uL (ref 1.7–7.7)
NEUTROS PCT: 89 %
Platelets: 105 10*3/uL — ABNORMAL LOW (ref 150–400)
RBC: 4.54 MIL/uL (ref 3.87–5.11)
RDW: 12.6 % (ref 11.5–15.5)
WBC: 3.5 10*3/uL — ABNORMAL LOW (ref 4.0–10.5)

## 2016-05-30 LAB — INFLUENZA PANEL BY PCR (TYPE A & B)
INFLBPCR: NEGATIVE
Influenza A By PCR: POSITIVE — AB

## 2016-05-30 LAB — ETHANOL: Alcohol, Ethyl (B): <5 mg/dL (ref <5)

## 2016-05-30 LAB — MAGNESIUM: Magnesium: 2.1 mg/dL (ref 1.7–2.4)

## 2016-05-30 LAB — I-STAT CG4 LACTIC ACID, ED
LACTIC ACID, VENOUS: 1.71 mmol/L (ref 0.5–1.9)
LACTIC ACID, VENOUS: 1.95 mmol/L — AB (ref 0.5–1.9)

## 2016-05-30 LAB — APTT: aPTT: 32 seconds (ref 24–36)

## 2016-05-30 LAB — PROTIME-INR
INR: 1.14
Prothrombin Time: 14.6 seconds (ref 11.4–15.2)

## 2016-05-30 LAB — LACTIC ACID, PLASMA: Lactic Acid, Venous: 2.3 mmol/L (ref 0.5–1.9)

## 2016-05-30 LAB — PROCALCITONIN: Procalcitonin: 0.15 ng/mL

## 2016-05-30 MED ORDER — THIAMINE HCL 100 MG/ML IJ SOLN
100.0000 mg | Freq: Every day | INTRAMUSCULAR | Status: DC
  Filled 2016-05-30: qty 2

## 2016-05-30 MED ORDER — GUAIFENESIN 100 MG/5ML PO SYRP
200.0000 mg | ORAL_SOLUTION | Freq: Three times a day (TID) | ORAL | Status: DC | PRN
  Filled 2016-05-30: qty 10

## 2016-05-30 MED ORDER — ACETAMINOPHEN 325 MG PO TABS
650.0000 mg | ORAL_TABLET | Freq: Once | ORAL | Status: AC
  Administered 2016-05-30: 650 mg via ORAL
  Filled 2016-05-30: qty 2

## 2016-05-30 MED ORDER — LORAZEPAM 2 MG/ML IJ SOLN: 1.0000 mg | Freq: Four times a day (QID) | INTRAMUSCULAR | Status: DC | PRN

## 2016-05-30 MED ORDER — BISACODYL 5 MG PO TBEC: 5.0000 mg | DELAYED_RELEASE_TABLET | Freq: Every day | ORAL | Status: DC | PRN

## 2016-05-30 MED ORDER — ONDANSETRON HCL 4 MG/2ML IJ SOLN: 4.0000 mg | Freq: Four times a day (QID) | INTRAMUSCULAR | Status: DC | PRN

## 2016-05-30 MED ORDER — LACTATED RINGERS IV SOLN
INTRAVENOUS | Status: DC
Start: 2016-05-30 — End: 2016-05-30
  Administered 2016-05-30 (×2): via INTRAVENOUS

## 2016-05-30 MED ORDER — POLYETHYLENE GLYCOL 3350 17 G PO PACK: 17.0000 g | PACK | Freq: Every day | ORAL | Status: DC | PRN

## 2016-05-30 MED ORDER — ENOXAPARIN SODIUM 40 MG/0.4ML ~~LOC~~ SOLN
40.0000 mg | SUBCUTANEOUS | Status: DC
  Administered 2016-05-30 – 2016-06-02 (×3): 40 mg via SUBCUTANEOUS
  Filled 2016-05-30 (×4): qty 0.4

## 2016-05-30 MED ORDER — SODIUM CHLORIDE 0.9 % IV BOLUS (SEPSIS)
500.0000 mL | Freq: Once | INTRAVENOUS | Status: AC
  Administered 2016-05-30: 500 mL via INTRAVENOUS

## 2016-05-30 MED ORDER — HYDROCODONE-ACETAMINOPHEN 5-325 MG PO TABS
1.0000 | ORAL_TABLET | ORAL | Status: DC | PRN
Start: 2016-05-30 — End: 2016-06-02
  Administered 2016-06-02: 2 via ORAL
  Filled 2016-05-30: qty 2

## 2016-05-30 MED ORDER — ACETAMINOPHEN 650 MG RE SUPP: 650.0000 mg | Freq: Four times a day (QID) | RECTAL | Status: DC | PRN

## 2016-05-30 MED ORDER — ONDANSETRON HCL 4 MG PO TABS: 4.0000 mg | ORAL_TABLET | Freq: Four times a day (QID) | ORAL | Status: DC | PRN

## 2016-05-30 MED ORDER — POTASSIUM CHLORIDE CRYS ER 20 MEQ PO TBCR
40.0000 meq | EXTENDED_RELEASE_TABLET | Freq: Once | ORAL | Status: AC
  Administered 2016-05-30: 40 meq via ORAL
  Filled 2016-05-30: qty 2

## 2016-05-30 MED ORDER — POTASSIUM CHLORIDE IN NACL 20-0.9 MEQ/L-% IV SOLN
INTRAVENOUS | Status: AC
  Administered 2016-05-30: via INTRAVENOUS
  Filled 2016-05-30: qty 1000

## 2016-05-30 MED ORDER — ADULT MULTIVITAMIN W/MINERALS CH
1.0000 | ORAL_TABLET | Freq: Every day | ORAL | Status: DC
  Administered 2016-05-31 – 2016-06-02 (×4): 1 via ORAL
  Filled 2016-05-30 (×3): qty 1

## 2016-05-30 MED ORDER — VITAMIN B-1 100 MG PO TABS
100.0000 mg | ORAL_TABLET | Freq: Every day | ORAL | Status: DC
  Administered 2016-05-30 – 2016-06-02 (×4): 100 mg via ORAL
  Filled 2016-05-30 (×4): qty 1

## 2016-05-30 MED ORDER — LORAZEPAM 1 MG PO TABS: 1.0000 mg | ORAL_TABLET | Freq: Four times a day (QID) | ORAL | Status: DC | PRN

## 2016-05-30 MED ORDER — METOPROLOL SUCCINATE ER 100 MG PO TB24
100.0000 mg | ORAL_TABLET | Freq: Two times a day (BID) | ORAL | Status: DC
  Administered 2016-05-31 – 2016-06-02 (×5): 100 mg via ORAL
  Filled 2016-05-30 (×5): qty 1

## 2016-05-30 MED ORDER — ENSURE ENLIVE PO LIQD
237.0000 mL | Freq: Two times a day (BID) | ORAL | Status: DC
  Administered 2016-05-31 – 2016-06-02 (×5): 237 mL via ORAL

## 2016-05-30 MED ORDER — TEMAZEPAM 15 MG PO CAPS
15.0000 mg | ORAL_CAPSULE | Freq: Every day | ORAL | Status: DC
  Administered 2016-05-30 – 2016-06-02 (×3): 15 mg via ORAL
  Filled 2016-05-30 (×3): qty 1

## 2016-05-30 MED ORDER — FOLIC ACID 0.5 MG HALF TAB
500.0000 ug | ORAL_TABLET | Freq: Every day | ORAL | Status: DC
  Filled 2016-05-30: qty 1

## 2016-05-30 MED ORDER — LEVOTHYROXINE SODIUM 75 MCG PO TABS
75.0000 ug | ORAL_TABLET | Freq: Every day | ORAL | Status: DC
Start: 2016-05-31 — End: 2016-06-02
  Administered 2016-05-31 – 2016-06-02 (×3): 75 ug via ORAL
  Filled 2016-05-30 (×3): qty 1

## 2016-05-30 MED ORDER — ACETAMINOPHEN 325 MG PO TABS
650.0000 mg | ORAL_TABLET | Freq: Four times a day (QID) | ORAL | Status: DC | PRN
  Administered 2016-05-31: 650 mg via ORAL
  Filled 2016-05-30: qty 2

## 2016-05-30 MED ORDER — OSELTAMIVIR PHOSPHATE 30 MG PO CAPS
30.0000 mg | ORAL_CAPSULE | Freq: Two times a day (BID) | ORAL | Status: DC
  Administered 2016-05-31: 30 mg via ORAL
  Filled 2016-05-30: qty 1

## 2016-05-30 MED ORDER — CLONIDINE HCL 0.1 MG PO TABS
0.1000 mg | ORAL_TABLET | Freq: Every day | ORAL | Status: DC
  Administered 2016-05-30 – 2016-06-02 (×3): 0.1 mg via ORAL
  Filled 2016-05-30 (×3): qty 1

## 2016-05-30 MED ORDER — OSELTAMIVIR PHOSPHATE 75 MG PO CAPS
75.0000 mg | ORAL_CAPSULE | Freq: Once | ORAL | Status: AC
  Administered 2016-05-30: 75 mg via ORAL
  Filled 2016-05-30: qty 1

## 2016-05-30 MED ORDER — LEVOFLOXACIN IN D5W 500 MG/100ML IV SOLN
500.0000 mg | Freq: Every day | INTRAVENOUS | Status: DC
  Administered 2016-05-31 – 2016-06-01 (×2): 500 mg via INTRAVENOUS
  Filled 2016-05-30 (×2): qty 100

## 2016-05-30 NOTE — ED Notes (Signed)
Notified lab to send PCR flu swab.

## 2016-05-30 NOTE — ED Triage Notes (Signed)
Pt here from home with c/o stroke like symptoms , left side facial drop that started 2 days ago

## 2016-05-30 NOTE — ED Notes (Signed)
Please call daughter, Anderson Malta, with any updates on the pt. [phone]

## 2016-05-30 NOTE — ED Notes (Signed)
Attempted report x1. 

## 2016-05-30 NOTE — H&P (Signed)
History and Physical    Heather Wilcox [id number] DOB: 1941-01-15 DOA: 05/30/2016  PCP: Benito Mccreedy, MD   Patient coming from: Home  Chief Complaint: Gen weakness, malaise, cough, rigors, nausea, diarrhea  HPI: Heather Wilcox is a 76 y.o. female with medical history significant for hypertension, hypothyroidism, and meningioma status post remote resection who presents to the emergency department with 2 days of generalized weakness, nausea, diarrhea, and chills. Patient had reportedly been in her usual state of health until approximately 2 days ago when she developed a general weakness and general sense of malaise. She became nauseous with this and has had several episodes of nonbloody diarrhea. She denies abdominal pain. Patient also denies fevers, but notes chills with shakes. There has been a mild cough, but she denies rhinorrhea or sore throat. She endorses exertional shortness of breath. She denies chest pain or palpitations and denies headache, change in vision or hearing, or focal numbness or weakness. She endorses some chronic motor deficits following her right temporal craniotomy in 2012. Per report, the patient's family was concerned that her shaking was secondary to alcohol withdrawal as they report her to be a daily drinker.  ED Course: Upon arrival to the ED, patient is found to be febrile to 39.3 C, requiring 2 L/m of supplemental oxygen to maintain saturations in the 90s, and hypertensive to 170/100. EKG features a sinus rhythm with LVH by voltage criteria and secondary repolarization abnormality. Chest x-ray is notable for low-volume lungs and otherwise negative for acute process. Noncontrast head CT is negative for acute intracranial abnormality. Chemistry panels notable for a potassium of 2.6 and AST of 127 with normal ALT and bilirubin. CBC features a leukopenia with WBC 3500 and a thrombocytopenia with platelets 105,000. Lactic acid is 1.71, increasing to 1.95 on repeat.  Ethanol level is undetectable. Blood and urine cultures were obtained, patient was treated with acetaminophen and empiric Tamiflu, and she was given 40 mEq oral potassium. She has remained hemodynamically stable and not in any acute respiratory distress, and will be observed on the telemetry unit for ongoing evaluation and management of influenza-like illness with hypokalemia, leukopenia, and thrombocytopenia.  Review of Systems:  All other systems reviewed and apart from HPI, are negative.  Past Medical History:  Diagnosis Date  . History of stomach ulcers   . Hypertension   . Hypothyroidism     Past Surgical History:  Procedure Laterality Date  . CRANIOTOMY FOR TUMOR Left 04/2004    temporal craniotomy for meningioma with microdissection/notes 09/11/2010  . REPAIR OF PERFORATED ULCER     "bleeding stomach ulcer"  . TOTAL THYROIDECTOMY       reports that she has been smoking Cigarettes.  She has a 5.00 pack-year smoking history. She has never used smokeless tobacco. She reports that she does not drink alcohol or use drugs.  No Known Allergies  Family History  Problem Relation Age of Onset  . Family history unknown: Yes     Prior to Admission medications   Medication Sig Start Date End Date Taking? Authorizing Provider  cloNIDine (CATAPRES) 0.1 MG tablet Take 0.1 mg by mouth at bedtime.   Yes Historical Provider, MD  diphenhydramine-acetaminophen (TYLENOL PM) 25-500 MG TABS tablet Take 1 tablet by mouth daily as needed (for pain).   Yes Historical Provider, MD  folic acid (FOLVITE) A999333 MCG tablet Take 400 mcg by mouth daily.   Yes Historical Provider, MD  guaifenesin (ROBITUSSIN) 100 MG/5ML syrup Take 200 mg by mouth 3 (three)  times daily as needed for cough.   Yes Historical Provider, MD  levothyroxine (SYNTHROID, LEVOTHROID) 75 MCG tablet Take 75 mcg by mouth daily before breakfast.   Yes Historical Provider, MD  lisinopril-hydrochlorothiazide (PRINZIDE,ZESTORETIC) 20-25 MG  tablet Take 2 tablets by mouth daily.   Yes Historical Provider, MD  metoprolol succinate (TOPROL-XL) 100 MG 24 hr tablet Take 100 mg by mouth 2 (two) times daily. Take with or immediately following a meal.   Yes Historical Provider, MD  temazepam (RESTORIL) 15 MG capsule Take 15 mg by mouth at bedtime.   Yes Historical Provider, MD    Physical Exam: Vitals:   05/30/16 1930 05/30/16 1941 05/30/16 1945 05/30/16 2027  BP: 133/68   (!) 167/84  Pulse:   82 83  Resp:    18  Temp:  99.2 F (37.3 C)  98.5 F (36.9 C)  TempSrc:    Oral  SpO2:   92%       Constitutional: No acute distress, appears uncomfortable, rigors Eyes: PERTLA, lids and conjunctivae normal ENMT: Mucous membranes are moist. Posterior pharynx clear of any exudate or lesions.   Neck: normal, supple, no masses, no thyromegaly Respiratory: Crackles at bilateral mid-lung zones. Normal respiratory effort. No accessory muscle use.  Cardiovascular: S1 & S2 heard, regular rate and rhythm. No extremity edema. No significant JVD. Abdomen: No distension, no tenderness, no masses palpated. Bowel sounds normal.  Musculoskeletal: no clubbing / cyanosis. No joint deformity upper and lower extremities. Normal muscle tone.  Skin: no significant rashes, lesions, ulcers. Warm, dry, well-perfused. Neurologic: No gross facial asymmetry. Moving all extremities. Grip strength 4-5/5 bilaterally, plantar- and dorsiflexion 5/5 bilaterally.  Psychiatric: Alert and oriented x 3. Normal mood and affect.     Labs on Admission: I have personally reviewed following labs and imaging studies  CBC:  Recent Labs Lab 05/30/16 1526  WBC 3.5*  NEUTROABS 3.1  HGB 13.7  HCT 42.0  MCV 92.5  PLT 123456*   Basic Metabolic Panel:  Recent Labs Lab 05/30/16 1526 05/30/16 1810  NA 139  --   K 2.6*  --   CL 102  --   CO2 26  --   GLUCOSE 130*  --   BUN 20  --   CREATININE 1.02*  --   CALCIUM 9.0  --   MG  --  2.1   GFR: CrCl cannot be  calculated (Unknown ideal weight.). Liver Function Tests:  Recent Labs Lab 05/30/16 1526  AST 127*  ALT 52  ALKPHOS 48  BILITOT 0.9  PROT 8.6*  ALBUMIN 3.8   No results for input(s): LIPASE, AMYLASE in the last 168 hours. No results for input(s): AMMONIA in the last 168 hours. Coagulation Profile:  Recent Labs Lab 05/30/16 2148  INR 1.14   Cardiac Enzymes: No results for input(s): CKTOTAL, CKMB, CKMBINDEX, TROPONINI in the last 168 hours. BNP (last 3 results) No results for input(s): PROBNP in the last 8760 hours. HbA1C: No results for input(s): HGBA1C in the last 72 hours. CBG: No results for input(s): GLUCAP in the last 168 hours. Lipid Profile: No results for input(s): CHOL, HDL, LDLCALC, TRIG, CHOLHDL, LDLDIRECT in the last 72 hours. Thyroid Function Tests: No results for input(s): TSH, T4TOTAL, FREET4, T3FREE, THYROIDAB in the last 72 hours. Anemia Panel: No results for input(s): VITAMINB12, FOLATE, FERRITIN, TIBC, IRON, RETICCTPCT in the last 72 hours. Urine analysis: No results found for: COLORURINE, APPEARANCEUR, LABSPEC, [place], GLUCOSEU, HGBUR, BILIRUBINUR, KETONESUR, PROTEINUR, UROBILINOGEN, NITRITE, LEUKOCYTESUR Sepsis Labs: @  LABRCNTIP(procalcitonin:4,lacticidven:4) )No results found for this or any previous visit (from the past 240 hour(s)).   Radiological Exams on Admission: Ct Head Wo Contrast  Result Date: 05/30/2016 CLINICAL DATA:  Acute onset of generalized weakness and left facial twitching. Initial encounter. EXAM: CT HEAD WITHOUT CONTRAST TECHNIQUE: Contiguous axial images were obtained from the base of the skull through the vertex without intravenous contrast. COMPARISON:  MRI of the brain performed 01/09/2006 FINDINGS: Brain: No evidence of acute infarction, hemorrhage, hydrocephalus, extra-axial collection or mass lesion/mass effect. Prominence of the sulci suggests mild cortical volume loss. Chronic encephalomalacia is noted at the left temporal  lobe, with overlying postoperative change. Chronic ischemic change is noted at the external capsule bilaterally. The brainstem and fourth ventricle are within normal limits. No mass effect or midline shift is seen. Vascular: No hyperdense vessel or unexpected calcification. Skull: There is no evidence of fracture; visualized osseous structures are unremarkable in appearance. The patient is status post left frontoparietal craniotomy. Sinuses/Orbits: The orbits are within normal limits. The paranasal sinuses and mastoid air cells are well-aerated. Other: No significant soft tissue abnormalities are seen. IMPRESSION: 1. No acute intracranial pathology seen on CT. 2. Mild cortical volume loss and chronic ischemic change at the external capsule bilaterally. 3. Chronic encephalomalacia at the left temporal lobe, with overlying postoperative change. Electronically Signed   By: Garald Balding M.D.   On: 05/30/2016 18:11   Dg Chest Port 1 View  Result Date: 05/30/2016 CLINICAL DATA:  76 year old female with fever weakness and cough for 3 days. Initial encounter. EXAM: PORTABLE CHEST 1 VIEW COMPARISON:  Chest radiographs 10/24/2005. FINDINGS: Portable AP semi upright view at 1552 hours. Lower lung volumes. Stable cardiomegaly and mediastinal contours. Calcified aortic atherosclerosis. Visualized tracheal air column is within normal limits. No pneumothorax, pleural effusion or consolidation. Pulmonary vascularity and/or crowding of lung markings are increased but without acute edema or convincing confluent opacity. Chronic epigastric surgical clips. IMPRESSION: Low lung volumes, otherwise no acute cardiopulmonary abnormality. Electronically Signed   By: Genevie Ann M.D.   On: 05/30/2016 16:13    EKG: Independently reviewed. Sinus rhythm, LVH with repolarization abnormality  Assessment/Plan  1. Sepsis with influenza A  - Presents with fevers, leukopenia, new O2 requirement - BP has been stable  - Blood cultures  incubating, UA and culture pending  - CXR without obvious infiltrate; influenza A positive  - She will be treated with Tamiflu; given sepsis features, hypoxia, and elevated pro-calcitonin, will add Levaquin for now  - Follow-up UA, follow cultures, tailor treatment as indicated   2. Hypokalemia  - Serum potassium 2.6 on admission with magnesium 2.1  - Likely secondary to diarrhea  - She was given 40 mEq oral potassium in ED and KCl added to her IVF  - Monitor on telemetry and repeat chem panel in am   3. Leukopenia, thrombocytopenia  - WBC 3,000 and platelets 105,000 on admission  - No recent CBC available for comparison; had been normal remotely  - Possibly secondary to acute infection  - Check HIV, viral hepatitis panel, B12, folate, iron studies, TSH    4. Elevated AST  - AST 127 on admission with normal ALT and normal bilirubin - There is no RUQ tenderness  - Possibly secondary to alcohol abuse as reported by her family  - Viral hepatitis panel pending    5. Hypertension  - BP elevated on admission  - Continue clonidine and Toprol  - Hold HCTZ given the electrolyte disturbances and dehydration  -  Hold lisinopril for now given acute illness, possible sepsis     DVT prophylaxis: SCD's Code Status: Full  Family Communication: Discussed with patient Disposition Plan: Observe on telemetry Consults called: None Admission status: Observation    Vianne Bulls, MD [facility] Pager [phone]  If 7PM-7AM, please contact night-coverage [email or web address] Password [email or web address]  05/30/2016, 11:42 PM

## 2016-05-30 NOTE — ED Provider Notes (Signed)
[facility] DEPT Provider Note   CSN: [id number] Arrival date & time: 05/30/16  1446     History   Chief Complaint Chief Complaint  Patient presents with  . Weakness    HPI Heather Wilcox is a 76 y.o. female.  The history is provided by the patient and a relative. No language interpreter was used.  Weakness     Heather Wilcox is a 76 y.o. female who presents to the Emergency Department complaining of weakness.  She presented via EMS for weakness. She reports feeling poorly since yesterday with nausea, diarrhea and shaking. Her shaking worsened significantly today and her daughter called 911. She denies any chest pain, headache, abdominal pain, cough, shortness of breath. No known sick contacts.  Family report that she has been weaker lately and she has a chronic tick to the left side of her face but is worse over the last day.  Past Medical History:  Diagnosis Date  . History of stomach ulcers   . Hypertension   . Hypothyroidism     Patient Active Problem List   Diagnosis Date Noted  . Influenza A 05/30/2016  . Hypokalemia 05/30/2016  . Leukopenia 05/30/2016  . Thrombocytopenia ([place]) 05/30/2016  . Hypertensive urgency 05/30/2016  . Transaminasemia 05/30/2016  . MENINGIOMA 10/06/2006  . Hypothyroidism 10/06/2006  . DM 10/06/2006  . GLAUCOMA, PRIMARY OPEN-ANGLE 10/06/2006  . Hypertension 10/06/2006  . DEGENERATIVE JOINT DISEASE, LEFT KNEE 10/06/2006  . THYROIDECTOMY, HX OF 10/06/2006    Past Surgical History:  Procedure Laterality Date  . CRANIOTOMY FOR TUMOR Left 04/2004    temporal craniotomy for meningioma with microdissection/notes 09/11/2010  . REPAIR OF PERFORATED ULCER     "bleeding stomach ulcer"  . TOTAL THYROIDECTOMY      OB History    No data available       Home Medications    Prior to Admission medications   Medication Sig Start Date End Date Taking? Authorizing Provider  cloNIDine (CATAPRES) 0.1 MG tablet Take 0.1 mg by mouth at  bedtime.   Yes Historical Provider, MD  diphenhydramine-acetaminophen (TYLENOL PM) 25-500 MG TABS tablet Take 1 tablet by mouth daily as needed (for pain).   Yes Historical Provider, MD  folic acid (FOLVITE) [id number] MCG tablet Take 400 mcg by mouth daily.   Yes Historical Provider, MD  guaifenesin (ROBITUSSIN) 100 MG/5ML syrup Take 200 mg by mouth 3 (three) times daily as needed for cough.   Yes Historical Provider, MD  levothyroxine (SYNTHROID, LEVOTHROID) 75 MCG tablet Take 75 mcg by mouth daily before breakfast.   Yes Historical Provider, MD  lisinopril-hydrochlorothiazide (PRINZIDE,ZESTORETIC) 20-25 MG tablet Take 2 tablets by mouth daily.   Yes Historical Provider, MD  metoprolol succinate (TOPROL-XL) 100 MG 24 hr tablet Take 100 mg by mouth 2 (two) times daily. Take with or immediately following a meal.   Yes Historical Provider, MD  temazepam (RESTORIL) 15 MG capsule Take 15 mg by mouth at bedtime.   Yes Historical Provider, MD    Family History Family History  Problem Relation Age of Onset  . Family history unknown: Yes    Social History Social History  Substance Use Topics  . Smoking status: Current Some Day Smoker    Packs/day: 0.10    Years: 50.00    Types: Cigarettes  . Smokeless tobacco: Never Used  . Alcohol use No     Allergies   Patient has no known allergies.   Review of Systems Review of Systems  Neurological:  Positive for weakness.  All other systems reviewed and are negative.    Physical Exam Updated Vital Signs BP (!) 167/84 (BP Location: Right Arm)   Pulse 83   Temp 98.5 F (36.9 C) (Oral)   Resp 18   SpO2 92%   Physical Exam  Constitutional: She is oriented to person, place, and time. She appears well-developed and well-nourished.  HENT:  Head: Normocephalic and atraumatic.  Cardiovascular: Regular rhythm.   No murmur heard. Tachycardic  Pulmonary/Chest: Effort normal. No respiratory distress.  Tachypnea with rales bilaterally, left greater  than right  Abdominal: Soft. There is no tenderness. There is no rebound and no guarding.  Musculoskeletal: She exhibits no edema or tenderness.  Neurological: She is alert and oriented to person, place, and time.  Generalized weakness, requires assistance when going to sitting position.  Skin: Skin is warm and dry.  Psychiatric: She has a normal mood and affect. Her behavior is normal.  Nursing note and vitals reviewed.    ED Treatments / Results  Labs (all labs ordered are listed, but only abnormal results are displayed) Labs Reviewed  COMPREHENSIVE METABOLIC PANEL - Abnormal; Notable for the following:       Result Value   Potassium 2.6 (*)    Glucose, Bld 130 (*)    Creatinine, Ser 1.02 (*)    Total Protein 8.6 (*)    AST 127 (*)    GFR calc non Af Amer 53 (*)    All other components within normal limits  CBC WITH DIFFERENTIAL/PLATELET - Abnormal; Notable for the following:    WBC 3.5 (*)    Platelets 105 (*)    Lymphs Abs 0.3 (*)    All other components within normal limits  INFLUENZA PANEL BY PCR (TYPE A & B) - Abnormal; Notable for the following:    Influenza A By PCR POSITIVE (*)    All other components within normal limits  LACTIC ACID, PLASMA - Abnormal; Notable for the following:    Lactic Acid, Venous 2.3 (*)    All other components within normal limits  VITAMIN B12 - Abnormal; Notable for the following:    Vitamin B-12 935 (*)    All other components within normal limits  FERRITIN - Abnormal; Notable for the following:    Ferritin 5,120 (*)    All other components within normal limits  I-STAT CG4 LACTIC ACID, ED - Abnormal; Notable for the following:    Lactic Acid, Venous 1.95 (*)    All other components within normal limits  CULTURE, BLOOD (ROUTINE X 2)  CULTURE, BLOOD (ROUTINE X 2)  URINE CULTURE  MAGNESIUM  ETHANOL  PROCALCITONIN  PROTIME-INR  APTT  IRON AND TIBC  URINALYSIS, ROUTINE W REFLEX MICROSCOPIC  LACTIC ACID, PLASMA  COMPREHENSIVE  METABOLIC PANEL  FOLATE  URINALYSIS, ROUTINE W REFLEX MICROSCOPIC  HIV ANTIBODY (ROUTINE TESTING)  HEPATITIS PANEL, ACUTE  I-STAT CG4 LACTIC ACID, ED    EKG  EKG Interpretation  Date/Time:  Thursday May 30 2016 15:15:22 EST Ventricular Rate:  95 PR Interval:    QRS Duration: 76 QT Interval:  306 QTC Calculation: 385 R Axis:   -9 Text Interpretation:  Sinus rhythm Probable left atrial enlargement LVH with secondary repolarization abnormality Confirmed by Hazle Coca [phone]) on 05/30/2016 3:20:39 PM       Radiology Ct Head Wo Contrast  Result Date: 05/30/2016 CLINICAL DATA:  Acute onset of generalized weakness and left facial twitching. Initial encounter. EXAM: CT HEAD WITHOUT CONTRAST TECHNIQUE:  Contiguous axial images were obtained from the base of the skull through the vertex without intravenous contrast. COMPARISON:  MRI of the brain performed 01/09/2006 FINDINGS: Brain: No evidence of acute infarction, hemorrhage, hydrocephalus, extra-axial collection or mass lesion/mass effect. Prominence of the sulci suggests mild cortical volume loss. Chronic encephalomalacia is noted at the left temporal lobe, with overlying postoperative change. Chronic ischemic change is noted at the external capsule bilaterally. The brainstem and fourth ventricle are within normal limits. No mass effect or midline shift is seen. Vascular: No hyperdense vessel or unexpected calcification. Skull: There is no evidence of fracture; visualized osseous structures are unremarkable in appearance. The patient is status post left frontoparietal craniotomy. Sinuses/Orbits: The orbits are within normal limits. The paranasal sinuses and mastoid air cells are well-aerated. Other: No significant soft tissue abnormalities are seen. IMPRESSION: 1. No acute intracranial pathology seen on CT. 2. Mild cortical volume loss and chronic ischemic change at the external capsule bilaterally. 3. Chronic encephalomalacia at the left temporal  lobe, with overlying postoperative change. Electronically Signed   By: Garald Balding M.D.   On: 05/30/2016 18:11   Dg Chest Port 1 View  Result Date: 05/30/2016 CLINICAL DATA:  76 year old female with fever weakness and cough for 3 days. Initial encounter. EXAM: PORTABLE CHEST 1 VIEW COMPARISON:  Chest radiographs 10/24/2005. FINDINGS: Portable AP semi upright view at 1552 hours. Lower lung volumes. Stable cardiomegaly and mediastinal contours. Calcified aortic atherosclerosis. Visualized tracheal air column is within normal limits. No pneumothorax, pleural effusion or consolidation. Pulmonary vascularity and/or crowding of lung markings are increased but without acute edema or convincing confluent opacity. Chronic epigastric surgical clips. IMPRESSION: Low lung volumes, otherwise no acute cardiopulmonary abnormality. Electronically Signed   By: Genevie Ann M.D.   On: 05/30/2016 16:13    Procedures Procedures (including critical care time)  Medications Ordered in ED Medications  cloNIDine (CATAPRES) tablet 0.1 mg (0.1 mg Oral Given [id number])  folic acid (FOLVITE) tablet 0.5 mg (not administered)  guaifenesin (ROBITUSSIN) 100 MG/5ML syrup 200 mg (not administered)  levothyroxine (SYNTHROID, LEVOTHROID) tablet 75 mcg (not administered)  metoprolol succinate (TOPROL-XL) 24 hr tablet 100 mg (not administered)  temazepam (RESTORIL) capsule 15 mg (15 mg Oral Given 05/30/16 2343)  enoxaparin (LOVENOX) injection 40 mg (40 mg Subcutaneous Given 05/30/16 2359)  0.9 % NaCl with KCl 20 mEq/ L  infusion ( Intravenous New Bag/Given 05/30/16 2342)  acetaminophen (TYLENOL) tablet 650 mg (not administered)    Or  acetaminophen (TYLENOL) suppository 650 mg (not administered)  HYDROcodone-acetaminophen (NORCO/VICODIN) 5-325 MG per tablet 1-2 tablet (not administered)  polyethylene glycol (MIRALAX / GLYCOLAX) packet 17 g (not administered)  bisacodyl (DULCOLAX) EC tablet 5 mg (not administered)  ondansetron (ZOFRAN)  tablet 4 mg (not administered)    Or  ondansetron (ZOFRAN) injection 4 mg (not administered)  LORazepam (ATIVAN) tablet 1 mg (not administered)    Or  LORazepam (ATIVAN) injection 1 mg (not administered)  thiamine (VITAMIN B-1) tablet 100 mg (100 mg Oral Given 05/30/16 2343)    Or  thiamine (B-1) injection 100 mg ( Intravenous See Alternative 05/30/16 2343)  multivitamin with minerals tablet 1 tablet (1 tablet Oral Given 05/31/16 0000)  feeding supplement (ENSURE ENLIVE) (ENSURE ENLIVE) liquid 237 mL (not administered)  oseltamivir (TAMIFLU) capsule 75 mg (not administered)  levofloxacin (LEVAQUIN) IVPB 500 mg (not administered)  acetaminophen (TYLENOL) tablet 650 mg (650 mg Oral Given 05/30/16 1534)  potassium chloride SA (K-DUR,KLOR-CON) CR tablet 40 mEq (40 mEq Oral Given  05/30/16 1647)  oseltamivir (TAMIFLU) capsule 75 mg (75 mg Oral Given 05/30/16 1647)  sodium chloride 0.9 % bolus 500 mL (500 mLs Intravenous Given 05/30/16 2359)     Initial Impression / Assessment and Plan / ED Course  I have reviewed the triage vital signs and the nursing notes.  Pertinent labs & imaging results that were available during my care of the patient were reviewed by me and considered in my medical decision making (see chart for details).     Patient here for evaluation of weakness. She is febrile, tachypneic on ED arrival. No evidence of acute pneumonia or serious bacterial infection at this time. Concern for influenza given her symptoms and she was treated empirically with Tamiflu. She is hypokalemic, this will be replaced. She does have a new oxygen requirement. Plan to admit to the hospitalist service for further treatment of her influenza. Patient and family updated of studies and plans for admission and they are in agreement with plan.  Final Clinical Impressions(s) / ED Diagnoses   Final diagnoses:  Influenza  Hypokalemia    New Prescriptions Current Discharge Medication List       Quintella Reichert,  MD 05/31/16 [phone]

## 2016-05-30 NOTE — ED Notes (Signed)
Attempted report x 2 

## 2016-05-31 DIAGNOSIS — J101 Influenza due to other identified influenza virus with other respiratory manifestations: Principal | ICD-10-CM

## 2016-05-31 DIAGNOSIS — E876 Hypokalemia: Secondary | ICD-10-CM

## 2016-05-31 DIAGNOSIS — I160 Hypertensive urgency: Secondary | ICD-10-CM

## 2016-05-31 DIAGNOSIS — E039 Hypothyroidism, unspecified: Secondary | ICD-10-CM

## 2016-05-31 DIAGNOSIS — J111 Influenza due to unidentified influenza virus with other respiratory manifestations: Secondary | ICD-10-CM | POA: Diagnosis present

## 2016-05-31 DIAGNOSIS — D696 Thrombocytopenia, unspecified: Secondary | ICD-10-CM

## 2016-05-31 DIAGNOSIS — R740 Nonspecific elevation of levels of transaminase and lactic acid dehydrogenase [LDH]: Secondary | ICD-10-CM

## 2016-05-31 DIAGNOSIS — R69 Illness, unspecified: Secondary | ICD-10-CM

## 2016-05-31 DIAGNOSIS — D72810 Lymphocytopenia: Secondary | ICD-10-CM

## 2016-05-31 DIAGNOSIS — I10 Essential (primary) hypertension: Secondary | ICD-10-CM

## 2016-05-31 LAB — LACTIC ACID, PLASMA: LACTIC ACID, VENOUS: 2.5 mmol/L — AB (ref 0.5–1.9)

## 2016-05-31 LAB — IRON AND TIBC
IRON: 63 ug/dL (ref 28–170)
Saturation Ratios: 19 % (ref 10.4–31.8)
TIBC: 325 ug/dL (ref 250–450)
UIBC: 262 ug/dL

## 2016-05-31 LAB — COMPREHENSIVE METABOLIC PANEL
ALBUMIN: 3 g/dL — AB (ref 3.5–5.0)
ALT: 57 U/L — ABNORMAL HIGH (ref 14–54)
ANION GAP: 11 (ref 5–15)
AST: 143 U/L — ABNORMAL HIGH (ref 15–41)
Alkaline Phosphatase: 40 U/L (ref 38–126)
BUN: 15 mg/dL (ref 6–20)
CO2: 23 mmol/L (ref 22–32)
Calcium: 8.2 mg/dL — ABNORMAL LOW (ref 8.9–10.3)
Chloride: 105 mmol/L (ref 101–111)
Creatinine, Ser: 0.74 mg/dL (ref 0.44–1.00)
GFR calc non Af Amer: >60 mL/min (ref >60)
GLUCOSE: 98 mg/dL (ref 65–99)
POTASSIUM: 3.1 mmol/L — AB (ref 3.5–5.1)
SODIUM: 139 mmol/L (ref 135–145)
TOTAL PROTEIN: 7 g/dL (ref 6.5–8.1)
Total Bilirubin: 0.7 mg/dL (ref 0.3–1.2)

## 2016-05-31 LAB — HIV ANTIBODY (ROUTINE TESTING W REFLEX): HIV Screen 4th Generation wRfx: NONREACTIVE

## 2016-05-31 LAB — VITAMIN B12: Vitamin B-12: 935 pg/mL — ABNORMAL HIGH (ref 180–914)

## 2016-05-31 LAB — GLUCOSE, CAPILLARY: GLUCOSE-CAPILLARY: 100 mg/dL — AB (ref 65–99)

## 2016-05-31 LAB — FERRITIN: FERRITIN: 5120 ng/mL — AB (ref 11–307)

## 2016-05-31 LAB — MAGNESIUM: MAGNESIUM: 1.8 mg/dL (ref 1.7–2.4)

## 2016-05-31 LAB — FOLATE: FOLATE: 45.9 ng/mL (ref >5.9)

## 2016-05-31 MED ORDER — FOLIC ACID 1 MG PO TABS
1.0000 mg | ORAL_TABLET | Freq: Every day | ORAL | Status: DC
  Administered 2016-05-31 – 2016-06-02 (×3): 1 mg via ORAL
  Filled 2016-05-31 (×3): qty 1

## 2016-05-31 MED ORDER — OSELTAMIVIR PHOSPHATE 6 MG/ML PO SUSR
45.0000 mg | ORAL | Status: AC
  Administered 2016-05-31: 45 mg via ORAL
  Filled 2016-05-31: qty 7.5

## 2016-05-31 MED ORDER — OSELTAMIVIR PHOSPHATE 75 MG PO CAPS
75.0000 mg | ORAL_CAPSULE | Freq: Two times a day (BID) | ORAL | Status: DC
  Administered 2016-06-01 – 2016-06-02 (×4): 75 mg via ORAL
  Filled 2016-05-31 (×4): qty 1

## 2016-05-31 MED ORDER — SODIUM CHLORIDE 0.9 % IV BOLUS (SEPSIS)
500.0000 mL | Freq: Once | INTRAVENOUS | Status: AC
  Administered 2016-05-31: 500 mL via INTRAVENOUS

## 2016-05-31 MED ORDER — POTASSIUM CHLORIDE CRYS ER 20 MEQ PO TBCR
40.0000 meq | EXTENDED_RELEASE_TABLET | Freq: Once | ORAL | Status: AC
  Administered 2016-05-31: 40 meq via ORAL
  Filled 2016-05-31: qty 2

## 2016-05-31 NOTE — Progress Notes (Signed)
Initial Nutrition Assessment  INTERVENTION:  Provide Ensure Enlive po BID until appetite improves, each supplement provides 350 kcal and 20 grams of protein   NUTRITION DIAGNOSIS:   Inadequate oral intake related to poor appetite, acute illness as evidenced by per patient/family report.   GOAL:   Patient will meet greater than or equal to 90% of their needs   MONITOR:   PO intake, Supplement acceptance, Skin, Weight trends, I & O's, Labs  REASON FOR ASSESSMENT:   Malnutrition Screening Tool    ASSESSMENT:   76 year old female with hypertension, hypothyroidism presented with generalized weakness, nausea, diarrhea, chills for the last 2 days. She denied any fevers but noted chills with shakes, mild cough. In the ED she had a fever of 102.7  Pt states that he hasn't eaten anything since Sunday and has lost from 192 lbs to 181 lbs in the past week. She reports having a poor appetite and just not eating. For the past few days she has had nausea. She states that she did not eat anything for breakfast or lunch today. She is interested in trying to drink nutritional supplements until her appetite improves.  There is no weight history on file to assess weight loss, but 11 lb wt loss in less than one week is unlikely. Pt does however have some mild muscle wasting or clavicles and calves.   Labs: low potassium, low calcium Diet Order:  Diet regular Room service appropriate? Yes; Fluid consistency: Thin  Skin:  Reviewed, no issues  Last BM:  2/2  Height:   Ht Readings from Last 1 Encounters:  05/31/16 5\' 7"  (1.702 m)    Weight:   Wt Readings from Last 1 Encounters:  05/31/16 181 lb 3.5 oz (82.2 kg)    Ideal Body Weight:  61.36 kg  BMI:  Body mass index is 28.38 kg/m.  Estimated Nutritional Needs:   Kcal:  1700-1900  Protein:  90-100 grams  Fluid:  1.9-2.1 L/day  EDUCATION NEEDS:   No education needs identified at this time  Camas, CSP,  LDN Inpatient Clinical Dietitian Pager: [phone] After Hours Pager: [phone]

## 2016-05-31 NOTE — Care Management Note (Signed)
Case Management Note  Patient Details  Name: [name] MRN: [id number] Date of Birth: Jun 07, 1940  Subjective/Objective:  Sepsis, Infleunza A                  Action/Plan: Discharge Planning:    NCM spoke to pt and dtr, Anderson Malta at bedside. Pt states she lives at home alone. States she was independent at home prior to hospital. Will continue to follow for dc needs.   PCP Benito Mccreedy MD  Expected Discharge Date:  06/01/2016            Expected Discharge Plan:  Home/Self Care  In-House Referral:  NA  Discharge planning Services  CM Consult  Post Acute Care Choice:  NA Choice offered to:  NA  DME Arranged:  N/A DME Agency:  NA  HH Arranged:  NA HH Agency:  NA  Status of Service:  Completed, signed off  If discussed at [place] of Stay Meetings, dates discussed:    Additional Comments:  Erenest Rasher, RN 05/31/2016, 11:50 AM

## 2016-05-31 NOTE — Progress Notes (Signed)
[facility]                                                                              Patient Demographics  Heather Wilcox, is a 76 y.o. female, DOB - [birth year], [id number]  Admit date - 05/30/2016   Admitting Physician Vianne Bulls, MD  Outpatient Primary MD for the patient is OSEI-BONSU,GEORGE, MD  Outpatient specialists:   LOS - 0  days    Chief Complaint  Patient presents with  . Weakness       Brief summary   Patient is a 76 year old female with hypertension, hypothyroidism presented with generalized weakness, nausea, diarrhea, chills for the last 2 days. She denied any fevers but noted chills with shakes, mild cough. In the ED she had a fever of 102.7, heart rate of 100, blood pressure 171/100, hypoxia with O2 sats of 87% on room air. Potassium was low at 2.6, patient was admitted for further workup.   Assessment & Plan    Principal Problem:  Sepsis with influenza A  - Presents with fevers, leukopenia, Tachycardia, hypoxia with new O2 requirement, lactic acidosis - Influenza panel was positive for influenza A. - Patient was started on Tamiflu, follow blood cultures. - Continue IV fluid hydration   Hypokalemia  - Serum potassium 2.6 on admission with magnesium 2.1  - Likely secondary to diarrhea  - Potassium improving, still low, replaced   Leukopenia, thrombocytopenia  - WBC 3,000 and platelets 105,000 on admission, possibly due to alcohol - Follow blood cultures   Elevated AST  - AST 127 on admission with normal ALT and normal bilirubin - There is no RUQ tenderness  - Possibly secondary to alcohol abuse as reported by her family  - Viral hepatitis panel pending    Hypertension  - BP elevated  - Continue clonidine and Toprol  - Hold HCTZ given the electrolyte disturbances and dehydration  - Hold lisinopril for now given acute illness, possible sepsis     Code Status full code  DVT Prophylaxis:  SCD's Family  Communication: Discussed in detail with the patient, all imaging results, lab results explained to the patient    Disposition Plan: Possible DC in a.m.  Time Spent in minutes  25 minutes  Procedures:    Consultants:     Antimicrobials:   Tamiflu  Levaquin   Medications  Scheduled Meds: . cloNIDine  0.1 mg Oral QHS  . enoxaparin (LOVENOX) injection  40 mg Subcutaneous Q24H  . feeding supplement (ENSURE ENLIVE)  237 mL Oral BID BM  . folic acid  1 mg Oral Daily  . levofloxacin (LEVAQUIN) IV  500 mg Intravenous QHS  . levothyroxine  75 mcg Oral QAC breakfast  . metoprolol succinate  100 mg Oral BID PC  . multivitamin with minerals  1 tablet Oral Daily  . oseltamivir  75 mg Oral BID  . temazepam  15 mg Oral QHS  . thiamine  100 mg Oral Daily   Or  . thiamine  100 mg Intravenous Daily   Continuous Infusions: PRN Meds:.acetaminophen **OR** acetaminophen, bisacodyl, guaifenesin, HYDROcodone-acetaminophen, LORazepam **OR** LORazepam, ondansetron **OR** ondansetron (ZOFRAN) IV, polyethylene glycol  Antibiotics   Anti-infectives    Start     Dose/Rate Route Frequency Ordered Stop   05/31/16 2200  oseltamivir (TAMIFLU) capsule 75 mg     75 mg Oral 2 times daily 05/31/16 0950 06/04/16 2159   05/31/16 1030  oseltamivir (TAMIFLU) 6 MG/ML suspension 45 mg     45 mg Oral NOW 05/31/16 0950 05/31/16 1128   05/30/16 2359  levofloxacin (LEVAQUIN) IVPB 500 mg     500 mg 100 mL/hr over 60 Minutes Intravenous Daily at bedtime 05/30/16 2338     05/30/16 2200  oseltamivir (TAMIFLU) capsule 30 mg  Status:  Discontinued    Comments:  Tamiflu 30 mg BID for CrCl < 60 mL/min   30 mg Oral 2 times daily 05/30/16 2336 05/31/16 0950   05/30/16 1645  oseltamivir (TAMIFLU) capsule 75 mg     75 mg Oral  Once 05/30/16 1637 05/30/16 1647        Subjective:   Fantasy Compitello was seen and examined today. Feeling better today, still spiked a fever overnight 101.2. Patient denies dizziness,  chest pain, shortness of breath, abdominal pain, N/V/D/C, new weakness, numbess, tingling.    Objective:   Vitals:   05/30/16 2027 05/31/16 0058 05/31/16 0242 05/31/16 0351  BP: (!) 167/84  (!) 176/89   Pulse: 83  79   Resp: 18  18   Temp: 98.5 F (36.9 C)  (!) 101.2 F (38.4 C) 99.8 F (37.7 C)  TempSrc: Oral  Oral Oral  SpO2:      Weight:  82.2 kg (181 lb 3.5 oz)    Height:  5\' 7"  (1.702 m)      Intake/Output Summary (Last 24 hours) at 05/31/16 1225 Last data filed at 05/31/16 1100  Gross per 24 hour  Intake              100 ml  Output                0 ml  Net              100 ml     Wt Readings from Last 3 Encounters:  05/31/16 82.2 kg (181 lb 3.5 oz)     Exam  General: Alert and oriented x 3, NAD, Still somewhat weak  HEENT:  PERRLA, EOMI, Anicteric Sclera, mucous membranes moist.   Neck: Supple, no JVD, no masses  Cardiovascular: S1 S2 auscultated, no rubs, murmurs or gallops. Regular rate and rhythm.  Respiratory: Clear to auscultation bilaterally, no wheezing, rales or rhonchi  Gastrointestinal: Soft, nontender, nondistended, + bowel sounds  Ext: no cyanosis clubbing or edema  Neuro: AAOx3, Cr N's II- XII. Strength 5/5 upper and lower extremities bilaterally  Skin: No rashes  Psych: Normal affect and demeanor, alert and oriented x3    Data Reviewed:  I have personally reviewed following labs and imaging studies  Micro Results No results found for this or any previous visit (from the past 240 hour(s)).  Radiology Reports Ct Head Wo Contrast  Result Date: 05/30/2016 CLINICAL DATA:  Acute onset of generalized weakness and left facial twitching. Initial encounter. EXAM: CT HEAD WITHOUT CONTRAST TECHNIQUE: Contiguous axial images were obtained from the base of the skull through the vertex without intravenous contrast. COMPARISON:  MRI of the brain performed 01/09/2006 FINDINGS: Brain: No evidence of acute infarction, hemorrhage, hydrocephalus,  extra-axial collection or mass lesion/mass effect. Prominence of the sulci suggests mild cortical volume loss. Chronic encephalomalacia is noted at the left temporal  lobe, with overlying postoperative change. Chronic ischemic change is noted at the external capsule bilaterally. The brainstem and fourth ventricle are within normal limits. No mass effect or midline shift is seen. Vascular: No hyperdense vessel or unexpected calcification. Skull: There is no evidence of fracture; visualized osseous structures are unremarkable in appearance. The patient is status post left frontoparietal craniotomy. Sinuses/Orbits: The orbits are within normal limits. The paranasal sinuses and mastoid air cells are well-aerated. Other: No significant soft tissue abnormalities are seen. IMPRESSION: 1. No acute intracranial pathology seen on CT. 2. Mild cortical volume loss and chronic ischemic change at the external capsule bilaterally. 3. Chronic encephalomalacia at the left temporal lobe, with overlying postoperative change. Electronically Signed   By: Garald Balding M.D.   On: 05/30/2016 18:11   Dg Chest Port 1 View  Result Date: 05/30/2016 CLINICAL DATA:  76 year old female with fever weakness and cough for 3 days. Initial encounter. EXAM: PORTABLE CHEST 1 VIEW COMPARISON:  Chest radiographs 10/24/2005. FINDINGS: Portable AP semi upright view at 1552 hours. Lower lung volumes. Stable cardiomegaly and mediastinal contours. Calcified aortic atherosclerosis. Visualized tracheal air column is within normal limits. No pneumothorax, pleural effusion or consolidation. Pulmonary vascularity and/or crowding of lung markings are increased but without acute edema or convincing confluent opacity. Chronic epigastric surgical clips. IMPRESSION: Low lung volumes, otherwise no acute cardiopulmonary abnormality. Electronically Signed   By: Genevie Ann M.D.   On: 05/30/2016 16:13    Lab Data:  CBC:  Recent Labs Lab 05/30/16 1526  WBC 3.5*    NEUTROABS 3.1  HGB 13.7  HCT 42.0  MCV 92.5  PLT 123456*   Basic Metabolic Panel:  Recent Labs Lab 05/30/16 1526 05/30/16 1810 05/31/16 0532  NA 139  --  139  K 2.6*  --  3.1*  CL 102  --  105  CO2 26  --  23  GLUCOSE 130*  --  98  BUN 20  --  15  CREATININE 1.02*  --  0.74  CALCIUM 9.0  --  8.2*  MG  --  2.1 1.8   GFR: Estimated Creatinine Clearance: 68 mL/min (by C-G formula based on SCr of 0.74 mg/dL). Liver Function Tests:  Recent Labs Lab 05/30/16 1526 05/31/16 0532  AST 127* 143*  ALT 52 57*  ALKPHOS 48 40  BILITOT 0.9 0.7  PROT 8.6* 7.0  ALBUMIN 3.8 3.0*   No results for input(s): LIPASE, AMYLASE in the last 168 hours. No results for input(s): AMMONIA in the last 168 hours. Coagulation Profile:  Recent Labs Lab 05/30/16 2148  INR 1.14   Cardiac Enzymes: No results for input(s): CKTOTAL, CKMB, CKMBINDEX, TROPONINI in the last 168 hours. BNP (last 3 results) No results for input(s): PROBNP in the last 8760 hours. HbA1C: No results for input(s): HGBA1C in the last 72 hours. CBG:  Recent Labs Lab 05/31/16 0749  GLUCAP 100*   Lipid Profile: No results for input(s): CHOL, HDL, LDLCALC, TRIG, CHOLHDL, LDLDIRECT in the last 72 hours. Thyroid Function Tests: No results for input(s): TSH, T4TOTAL, FREET4, T3FREE, THYROIDAB in the last 72 hours. Anemia Panel:  Recent Labs  05/30/16 2148  VITAMINB12 935*  FOLATE 45.9  FERRITIN 5,120*  TIBC 325  IRON 63   Urine analysis: No results found for: COLORURINE, APPEARANCEUR, LABSPEC, PHURINE, GLUCOSEU, HGBUR, BILIRUBINUR, KETONESUR, PROTEINUR, UROBILINOGEN, NITRITE, Corliss Skains M.D. [facility] 05/31/2016, 12:25 PM  Pager: [phone] Between 7am to 7pm - call Pager - [phone]  After 7pm  go to [email or web address] - password [email or web address]  Call night coverage person covering after 7pm

## 2016-05-31 NOTE — Care Management Obs Status (Signed)
[facility] NOTIFICATION   Patient Details  Name: Heather Wilcox MRN: [id number] Date of Birth: Feb 04, 1941   Medicare Observation Status Notification Given:  Yes    Erenest Rasher, RN 05/31/2016, 11:56 AM

## 2016-06-01 LAB — CBC
HCT: 38.3 % (ref 36.0–46.0)
Hemoglobin: 12.2 g/dL (ref 12.0–15.0)
MCH: 29.5 pg (ref 26.0–34.0)
MCHC: 31.9 g/dL (ref 30.0–36.0)
MCV: 92.7 fL (ref 78.0–100.0)
PLATELETS: 79 10*3/uL — AB (ref 150–400)
RBC: 4.13 MIL/uL (ref 3.87–5.11)
RDW: 12.5 % (ref 11.5–15.5)
WBC: 2.2 10*3/uL — ABNORMAL LOW (ref 4.0–10.5)

## 2016-06-01 LAB — HEPATITIS PANEL, ACUTE
HEP B C IGM: NEGATIVE
HEP B S AG: NEGATIVE
Hep A IgM: NEGATIVE

## 2016-06-01 LAB — BASIC METABOLIC PANEL
Anion gap: 8 (ref 5–15)
BUN: 13 mg/dL (ref 6–20)
CALCIUM: 8.7 mg/dL — AB (ref 8.9–10.3)
CHLORIDE: 105 mmol/L (ref 101–111)
CO2: 25 mmol/L (ref 22–32)
CREATININE: 0.76 mg/dL (ref 0.44–1.00)
GFR calc non Af Amer: >60 mL/min (ref >60)
GLUCOSE: 100 mg/dL — AB (ref 65–99)
Potassium: 3.4 mmol/L — ABNORMAL LOW (ref 3.5–5.1)
Sodium: 138 mmol/L (ref 135–145)

## 2016-06-01 LAB — GLUCOSE, CAPILLARY: Glucose-Capillary: 97 mg/dL (ref 65–99)

## 2016-06-01 MED ORDER — POTASSIUM CHLORIDE CRYS ER 20 MEQ PO TBCR: 40.0000 meq | EXTENDED_RELEASE_TABLET | Freq: Once | ORAL | Status: DC

## 2016-06-01 MED ORDER — OSELTAMIVIR PHOSPHATE 75 MG PO CAPS: 75.0000 mg | ORAL_CAPSULE | Freq: Two times a day (BID) | ORAL | 0 refills | Status: DC

## 2016-06-01 MED ORDER — LEVOFLOXACIN 500 MG PO TABS
500.0000 mg | ORAL_TABLET | Freq: Every day | ORAL | Status: DC
  Administered 2016-06-02: 500 mg via ORAL
  Filled 2016-06-01: qty 1

## 2016-06-01 MED ORDER — LISINOPRIL-HYDROCHLOROTHIAZIDE 20-25 MG PO TABS: 2.0000 | ORAL_TABLET | Freq: Every day | ORAL | Status: DC

## 2016-06-01 MED ORDER — POTASSIUM CHLORIDE CRYS ER 20 MEQ PO TBCR
40.0000 meq | EXTENDED_RELEASE_TABLET | Freq: Once | ORAL | Status: AC
  Administered 2016-06-01: 40 meq via ORAL
  Filled 2016-06-01: qty 2

## 2016-06-01 MED ORDER — LEVOFLOXACIN 500 MG PO TABS: 500.0000 mg | ORAL_TABLET | Freq: Every day | ORAL | 0 refills | Status: DC

## 2016-06-01 NOTE — Evaluation (Signed)
Physical Therapy Evaluation Patient Details Name: Heather Wilcox MRN: [id number] DOB: [birth year] Today's Date: 06/01/2016   History of Present Illness  pt presents with the Flu.  pt with hx of HTN, Etoh, and R Crani for Tumor resection.    Clinical Impression  Pt found to have L LE weakness, R gaze preference, L facial twitch, and memory deficits.  Pt indicates these symptoms are not normal for her.  RN to room and paged MD.  Pt does have a history of R crani for Tumor resection, but pt states she did not have any residual deficits after this surgery.  Pt indicates she has family, but that they are only able to stop by and help every now and then.  Feel pt is going to need increased A at D/C.  Will continue to follow.      Follow Up Recommendations SNF    Equipment Recommendations  None recommended by PT    Recommendations for Other Services       Precautions / Restrictions Precautions Precautions: Fall Restrictions Weight Bearing Restrictions: No      Mobility  Bed Mobility Overal bed mobility: Needs Assistance Bed Mobility: Supine to Sit     Supine to sit: Supervision     General bed mobility comments: pt needs increased time to complete, but no physical A needed.    Transfers Overall transfer level: Needs assistance Equipment used: Rolling walker (2 wheeled) Transfers: Sit to/from Stand Sit to Stand: Min assist         General transfer comment: cues for UE use and scooting hips closer to EOB prior to coming to standing.    Ambulation/Gait Ambulation/Gait assistance: Min assist Ambulation Distance (Feet): 15 Feet Assistive device: Rolling walker (2 wheeled) Gait Pattern/deviations: Step-to pattern;Decreased step length - left;Decreased stance time - left     General Gait Details: pt tends to drag L LE during ambulation and leans to R side.  pt indicates fatigued after ambulating short distance.    Stairs            Wheelchair Mobility    Modified  Rankin (Stroke Patients Only) Modified Rankin (Stroke Patients Only) Pre-Morbid Rankin Score: Slight disability Modified Rankin: Moderately severe disability     Balance Overall balance assessment: Needs assistance Sitting-balance support: No upper extremity supported;Feet supported Sitting balance-Leahy Scale: Good     Standing balance support: Single extremity supported;Bilateral upper extremity supported;During functional activity Standing balance-Leahy Scale: Poor                               Pertinent Vitals/Pain Pain Assessment: No/denies pain    Home Living Family/patient expects to be discharged to:: Private residence Living Arrangements: Alone Available Help at Discharge: Family;Available PRN/intermittently Type of Home: House Home Access: Stairs to enter Entrance Stairs-Rails: None Entrance Stairs-Number of Steps: 2 Home Layout: One level Home Equipment: Walker - 4 wheels;Cane - single point      Prior Function Level of Independence: Needs assistance      ADL's / Homemaking Assistance Needed: pt indicates sometimes her daughters or sisters will bring her meals, but normally she can fix light meals.  Family drives for pt.          Hand Dominance        Extremity/Trunk Assessment   Upper Extremity Assessment Upper Extremity Assessment: Generalized weakness    Lower Extremity Assessment Lower Extremity Assessment: Generalized weakness;LLE deficits/detail LLE Deficits / Details:  Sensation intact, Strength grossly 4-/5, AROM WFL.  pt indicates this weakness is not normal for her.   LLE Coordination: decreased fine motor;decreased gross motor    Cervical / Trunk Assessment Cervical / Trunk Assessment: Other exceptions Cervical / Trunk Exceptions: Cervical ROM WFL, but definite preference for R rotation.    Communication   Communication: No difficulties  Cognition Arousal/Alertness: Awake/alert Behavior During Therapy: WFL for tasks  assessed/performed Overall Cognitive Status: Impaired/Different from baseline Area of Impairment: Memory;Problem solving     Memory: Decreased short-term memory       Problem Solving: Slow processing;Decreased initiation;Difficulty sequencing;Requires verbal cues;Requires tactile cues General Comments: pt indicates she has been having memory deficits for a few days now.      General Comments      Exercises     Assessment/Plan    PT Assessment Patient needs continued PT services  PT Problem List Decreased strength;Decreased activity tolerance;Decreased balance;Decreased mobility;Decreased coordination;Decreased cognition;Decreased knowledge of use of DME          PT Treatment Interventions DME instruction;Gait training;Stair training;Functional mobility training;Therapeutic activities;Therapeutic exercise;Balance training;Neuromuscular re-education;Cognitive remediation;Patient/family education    PT Goals (Current goals can be found in the Care Plan section)  Acute Rehab PT Goals Patient Stated Goal: Get better PT Goal Formulation: With patient Time For Goal Achievement: 06/15/16 Potential to Achieve Goals: Good    Frequency Min 3X/week   Barriers to discharge Decreased caregiver support      Co-evaluation               End of Session Equipment Utilized During Treatment: Gait belt Activity Tolerance: Patient limited by fatigue Patient left: in chair;with call bell/phone within reach;with chair alarm set Nurse Communication: Mobility status         Time: [id number] PT Time Calculation (min) (ACUTE ONLY): 33 min   Charges:   PT Evaluation $PT Eval Moderate Complexity: 1 Procedure PT Treatments $Gait Training: 8-22 mins   PT G CodesCatarina Hartshorn, PT  [phone] 06/01/2016, 11:23 AM

## 2016-06-01 NOTE — Progress Notes (Signed)
[facility]                                                                              Patient Demographics  Heather Wilcox, is a 76 y.o. female, DOB - Jun 10, 1940, [id number]  Admit date - 05/30/2016   Admitting Physician Vianne Bulls, MD  Outpatient Primary MD for the patient is OSEI-BONSU,GEORGE, MD  Outpatient specialists:   LOS - 1  days    Chief Complaint  Patient presents with  . Weakness       Brief summary   Patient is a 76 year old female with hypertension, hypothyroidism presented with generalized weakness, nausea, diarrhea, chills for the last 2 days. She denied any fevers but noted chills with shakes, mild cough. In the ED she had a fever of 102.7, heart rate of 100, blood pressure 171/100, hypoxia with O2 sats of 87% on room air. Potassium was low at 2.6, patient was admitted for further workup.   Assessment & Plan    Principal Problem:  influenza A  - Sepsis ruled out, patient met SIRS criteria  with fevers, leukopenia, Tachycardia, hypoxia, lactic acidosis - Influenza panel was positive for influenza A. - Patient was started on Tamiflu - Blood cultures negative so far. - Continue IV fluid hydration   Hypokalemia  - Improving, 3.4 today, replaced  - Likely secondary to diarrhea    Leukopenia, thrombocytopenia  - WBC 3,000 and platelets 105,000 on admission, possibly due to alcohol - Hepatitis panel negative, HIV negative   Elevated AST  - AST 127 on admission with normal ALT and normal bilirubin - There is no RUQ tenderness  - Possibly secondary to alcohol abuse as reported by her family  - Hepatitis panel negative  Hypertension  -BP stable - Continue clonidine and Toprol  - Hold HCTZ given the electrolyte disturbances and dehydration  - Hold lisinopril for now given acute illness, possible sepsis     Code Status full code  DVT Prophylaxis:  SCD's Family Communication: Discussed in detail with the patient, all  imaging results, lab results explained to the patient    Disposition Plan: PT evaluation recommended skilled nursing facility  Time Spent in minutes  25 minutes  Procedures:    Consultants:     Antimicrobials:   Tamiflu  Levaquin   Medications  Scheduled Meds: . cloNIDine  0.1 mg Oral QHS  . enoxaparin (LOVENOX) injection  40 mg Subcutaneous Q24H  . feeding supplement (ENSURE ENLIVE)  237 mL Oral BID BM  . folic acid  1 mg Oral Daily  . levofloxacin (LEVAQUIN) IV  500 mg Intravenous QHS  . levothyroxine  75 mcg Oral QAC breakfast  . metoprolol succinate  100 mg Oral BID PC  . multivitamin with minerals  1 tablet Oral Daily  . oseltamivir  75 mg Oral BID  . temazepam  15 mg Oral QHS  . thiamine  100 mg Oral Daily   Or  . thiamine  100 mg Intravenous Daily   Continuous Infusions: PRN Meds:.acetaminophen **OR** acetaminophen, bisacodyl, guaifenesin, HYDROcodone-acetaminophen, LORazepam **OR** LORazepam, ondansetron **OR** ondansetron (ZOFRAN) IV, polyethylene glycol   Antibiotics   Anti-infectives  Start     Dose/Rate Route Frequency Ordered Stop   06/01/16 0000  oseltamivir (TAMIFLU) 75 MG capsule     75 mg Oral 2 times daily 06/01/16 0908     06/01/16 0000  levofloxacin (LEVAQUIN) 500 MG tablet     500 mg Oral Daily 06/01/16 0911     05/31/16 2200  oseltamivir (TAMIFLU) capsule 75 mg     75 mg Oral 2 times daily 05/31/16 0950 06/04/16 2159   05/31/16 1030  oseltamivir (TAMIFLU) 6 MG/ML suspension 45 mg     45 mg Oral NOW 05/31/16 0950 05/31/16 1128   05/30/16 2359  levofloxacin (LEVAQUIN) IVPB 500 mg     500 mg 100 mL/hr over 60 Minutes Intravenous Daily at bedtime 05/30/16 2338     05/30/16 2200  oseltamivir (TAMIFLU) capsule 30 mg  Status:  Discontinued    Comments:  Tamiflu 30 mg BID for CrCl < 60 mL/min   30 mg Oral 2 times daily 05/30/16 2336 05/31/16 0950   05/30/16 1645  oseltamivir (TAMIFLU) capsule 75 mg     75 mg Oral  Once 05/30/16 1637 05/30/16  1647        Subjective:   Heather Wilcox was seen and examined today. Feeling somewhat weak, otherwise afebrile, no nausea, vomiting. No diarrhea. Patient denies dizziness, chest pain, shortness of breath, abdominal pain, N/V/D/C, new weakness, numbess, tingling.    Objective:   Vitals:   05/31/16 2114 06/01/16 0104 06/01/16 0327 06/01/16 0443  BP: 137/74  118/89 124/74  Pulse: 72  74 76  Resp: '20  19 20  ' Temp: 98.5 F (36.9 C)  98.1 F (36.7 C) 98.4 F (36.9 C)  TempSrc:    Oral  SpO2: 100%  97% 99%  Weight:  82.5 kg (181 lb 14.1 oz)    Height:        Intake/Output Summary (Last 24 hours) at 06/01/16 1202 Last data filed at 06/01/16 0500  Gross per 24 hour  Intake              100 ml  Output                0 ml  Net              100 ml     Wt Readings from Last 3 Encounters:  06/01/16 82.5 kg (181 lb 14.1 oz)     Exam  General: Alert and oriented x 3, NAD, Still somewhat weak  HEENT:    Neck:   Cardiovascular: S1 S2 clear, RRR  Respiratory: Clear to auscultation bilaterally, no wheezing, rales or rhonchi  Gastrointestinal: Soft, nontender, nondistended, + bowel sounds  Ext: no cyanosis clubbing or edema  Neuro: No deficits  Skin: No rashes  Psych: Normal affect and demeanor, alert and oriented x3    Data Reviewed:  I have personally reviewed following labs and imaging studies  Micro Results Recent Results (from the past 240 hour(s))  Blood Culture (routine x 2)     Status: None (Preliminary result)   Collection Time: 05/30/16  3:26 PM  Result Value Ref Range Status   Specimen Description BLOOD RIGHT ANTECUBITAL  Final   Special Requests BOTTLES DRAWN AEROBIC AND ANAEROBIC 5CC  Final   Culture NO GROWTH < 24 HOURS  Final   Report Status PENDING  Incomplete  Blood Culture (routine x 2)     Status: None (Preliminary result)   Collection Time: 05/30/16  6:19 PM  Result Value  Ref Range Status   Specimen Description BLOOD LEFT HAND  Final    Special Requests   Final    BOTTLES DRAWN AEROBIC AND ANAEROBIC 10CC AER,6CC ANA   Culture NO GROWTH < 24 HOURS  Final   Report Status PENDING  Incomplete    Radiology Reports Ct Head Wo Contrast  Result Date: 05/30/2016 CLINICAL DATA:  Acute onset of generalized weakness and left facial twitching. Initial encounter. EXAM: CT HEAD WITHOUT CONTRAST TECHNIQUE: Contiguous axial images were obtained from the base of the skull through the vertex without intravenous contrast. COMPARISON:  MRI of the brain performed 01/09/2006 FINDINGS: Brain: No evidence of acute infarction, hemorrhage, hydrocephalus, extra-axial collection or mass lesion/mass effect. Prominence of the sulci suggests mild cortical volume loss. Chronic encephalomalacia is noted at the left temporal lobe, with overlying postoperative change. Chronic ischemic change is noted at the external capsule bilaterally. The brainstem and fourth ventricle are within normal limits. No mass effect or midline shift is seen. Vascular: No hyperdense vessel or unexpected calcification. Skull: There is no evidence of fracture; visualized osseous structures are unremarkable in appearance. The patient is status post left frontoparietal craniotomy. Sinuses/Orbits: The orbits are within normal limits. The paranasal sinuses and mastoid air cells are well-aerated. Other: No significant soft tissue abnormalities are seen. IMPRESSION: 1. No acute intracranial pathology seen on CT. 2. Mild cortical volume loss and chronic ischemic change at the external capsule bilaterally. 3. Chronic encephalomalacia at the left temporal lobe, with overlying postoperative change. Electronically Signed   By: Garald Balding M.D.   On: 05/30/2016 18:11   Dg Chest Port 1 View  Result Date: 05/30/2016 CLINICAL DATA:  76 year old female with fever weakness and cough for 3 days. Initial encounter. EXAM: PORTABLE CHEST 1 VIEW COMPARISON:  Chest radiographs 10/24/2005. FINDINGS: Portable AP semi  upright view at 1552 hours. Lower lung volumes. Stable cardiomegaly and mediastinal contours. Calcified aortic atherosclerosis. Visualized tracheal air column is within normal limits. No pneumothorax, pleural effusion or consolidation. Pulmonary vascularity and/or crowding of lung markings are increased but without acute edema or convincing confluent opacity. Chronic epigastric surgical clips. IMPRESSION: Low lung volumes, otherwise no acute cardiopulmonary abnormality. Electronically Signed   By: Genevie Ann M.D.   On: 05/30/2016 16:13    Lab Data:  CBC:  Recent Labs Lab 05/30/16 1526 06/01/16 0635  WBC 3.5* 2.2*  NEUTROABS 3.1  --   HGB 13.7 12.2  HCT 42.0 38.3  MCV 92.5 92.7  PLT 105* 79*   Basic Metabolic Panel:  Recent Labs Lab 05/30/16 1526 05/30/16 1810 05/31/16 0532 06/01/16 0635  NA 139  --  139 138  K 2.6*  --  3.1* 3.4*  CL 102  --  105 105  CO2 26  --  23 25  GLUCOSE 130*  --  98 100*  BUN 20  --  15 13  CREATININE 1.02*  --  0.74 0.76  CALCIUM 9.0  --  8.2* 8.7*  MG  --  2.1 1.8  --    GFR: Estimated Creatinine Clearance: 68.2 mL/min (by C-G formula based on SCr of 0.76 mg/dL). Liver Function Tests:  Recent Labs Lab 05/30/16 1526 05/31/16 0532  AST 127* 143*  ALT 52 57*  ALKPHOS 48 40  BILITOT 0.9 0.7  PROT 8.6* 7.0  ALBUMIN 3.8 3.0*   No results for input(s): LIPASE, AMYLASE in the last 168 hours. No results for input(s): AMMONIA in the last 168 hours. Coagulation Profile:  Recent Labs Lab  05/30/16 2148  INR 1.14   Cardiac Enzymes: No results for input(s): CKTOTAL, CKMB, CKMBINDEX, TROPONINI in the last 168 hours. BNP (last 3 results) No results for input(s): PROBNP in the last 8760 hours. HbA1C: No results for input(s): HGBA1C in the last 72 hours. CBG:  Recent Labs Lab 05/31/16 0749 06/01/16 0833  GLUCAP 100* 97   Lipid Profile: No results for input(s): CHOL, HDL, LDLCALC, TRIG, CHOLHDL, LDLDIRECT in the last 72 hours. Thyroid  Function Tests: No results for input(s): TSH, T4TOTAL, FREET4, T3FREE, THYROIDAB in the last 72 hours. Anemia Panel:  Recent Labs  05/30/16 2148  VITAMINB12 935*  FOLATE 45.9  FERRITIN 5,120*  TIBC 325  IRON 63   Urine analysis: No results found for: COLORURINE, APPEARANCEUR, LABSPEC, PHURINE, GLUCOSEU, HGBUR, BILIRUBINUR, KETONESUR, PROTEINUR, UROBILINOGEN, NITRITE, Corliss Skains M.D. [facility] 06/01/2016, 12:02 PM  Pager: [phone] Between 7am to 7pm - call Pager - [phone]  After 7pm go to [email or web address] - password [email or web address]  Call night coverage person covering after 7pm

## 2016-06-01 NOTE — Progress Notes (Signed)
PHARMACIST - PHYSICIAN COMMUNICATION  CONCERNING: Antibiotic IV to Oral Route Change Policy  RECOMMENDATION: This patient is receiving levaquin by the intravenous route.  Based on criteria approved by the Pharmacy and Therapeutics Committee, the antibiotic(s) is/are being converted to the equivalent oral dose form(s).   DESCRIPTION: These criteria include:  Patient being treated for a respiratory tract infection, urinary tract infection, cellulitis or clostridium difficile associated diarrhea if on metronidazole  The patient is not neutropenic and does not exhibit a GI malabsorption state  The patient is eating (either orally or via tube) and/or has been taking other orally administered medications for a least 24 hours  The patient is improving clinically and has a Tmax < 100.5  If you have questions about this conversion, please contact the Pharmacy Department  []   [phone] )  [facility] []   [phone] )  [facility] [x]   [phone] )  [facility] []   [phone] )  [facility] []   [phone] )  [place], [place] D 06/01/2016 2:33 PM

## 2016-06-01 NOTE — Progress Notes (Signed)
CSW received c/s for SNF placement.  Spoke to pt this afternoon who is refusing SNF at this time.  Pt is confident that her family, including her daughters, sister and sister's boyfriend will be able to assist her at d/c.  Per pt, she has family who live close to her, in the same neighborhood, although she does live alone.  CSW will continue to follow along, in case her disposition changes.  Creta Levin, LCSW Weekend Coverage [id number]

## 2016-06-02 DIAGNOSIS — R69 Illness, unspecified: Secondary | ICD-10-CM

## 2016-06-02 LAB — GLUCOSE, CAPILLARY: Glucose-Capillary: 106 mg/dL — ABNORMAL HIGH (ref 65–99)

## 2016-06-02 LAB — BASIC METABOLIC PANEL
Anion gap: 7 (ref 5–15)
BUN: 13 mg/dL (ref 6–20)
CALCIUM: 9 mg/dL (ref 8.9–10.3)
CO2: 25 mmol/L (ref 22–32)
CREATININE: 0.72 mg/dL (ref 0.44–1.00)
Chloride: 107 mmol/L (ref 101–111)
Glucose, Bld: 105 mg/dL — ABNORMAL HIGH (ref 65–99)
Potassium: 3.7 mmol/L (ref 3.5–5.1)
SODIUM: 139 mmol/L (ref 135–145)

## 2016-06-02 MED ORDER — OSELTAMIVIR PHOSPHATE 75 MG PO CAPS: 75.0000 mg | ORAL_CAPSULE | Freq: Two times a day (BID) | ORAL | 0 refills | Status: DC

## 2016-06-02 MED ORDER — LEVOFLOXACIN 500 MG PO TABS: 500.0000 mg | ORAL_TABLET | Freq: Every day | ORAL | 0 refills | Status: DC

## 2016-06-02 NOTE — Care Management Note (Signed)
Case Management Note  Patient Details  Name: TRISTACA DADDIO MRN: [id number] Date of Birth: [birth year]  Subjective/Objective:  76 y.o. F who was I pta at home where she lives surrounded by family in her community that can assist her daily. Originally PT suggested SNF which she refused. Has chosen [facility], [place] the rep for this agency can provide PT/OT services. No nursing services needed or desired by the family at this time, Has all DME.                   Action/Plan: Anticipate discharge home today. No further CM needs but will be available should additional discharge needs arise.   Expected Discharge Date:  06/02/16               Expected Discharge Plan:  [place]  In-House Referral:  NA  Discharge planning Services  CM Consult  Post Acute Care Choice:  Home Health Choice offered to:  Patient, Adult Children  DME Arranged:   (pt has RW and BSC) DME Agency:  NA  HH Arranged:  PT, OT HH Agency:  [facility]  Status of Service:  Completed, signed off  If discussed at [place] of Stay Meetings, dates discussed:    Additional Comments:  Delrae Sawyers, RN 06/02/2016, 9:52 AM

## 2016-06-02 NOTE — Progress Notes (Signed)
CSW spoke with patient regarding discharge plans. Patient and family agreeable to discharge home.   Kingsley Spittle, LCSWA Clinical Social Worker [phone]

## 2016-06-02 NOTE — Progress Notes (Signed)
Nsg Discharge Note  Admit Date:  05/30/2016 Discharge date: 06/02/2016   Heather Wilcox to be D/C'd Home per MD order.  AVS completed.  Copy for chart, and copy for patient signed, and dated. Patient/caregiver able to verbalize understanding.  Discharge Medication: Allergies as of 06/02/2016   No Known Allergies     Medication List    TAKE these medications   cloNIDine 0.1 MG tablet Commonly known as:  CATAPRES Take 0.1 mg by mouth at bedtime.   diphenhydramine-acetaminophen 25-500 MG Tabs tablet Commonly known as:  TYLENOL PM Take 1 tablet by mouth daily as needed (for pain).   folic acid A999333 MCG tablet Commonly known as:  FOLVITE Take 400 mcg by mouth daily.   guaifenesin 100 MG/5ML syrup Commonly known as:  ROBITUSSIN Take 200 mg by mouth 3 (three) times daily as needed for cough.   levofloxacin 500 MG tablet Commonly known as:  LEVAQUIN Take 1 tablet (500 mg total) by mouth daily. X 3 days   levothyroxine 75 MCG tablet Commonly known as:  SYNTHROID, LEVOTHROID Take 75 mcg by mouth daily before breakfast.   lisinopril-hydrochlorothiazide 20-25 MG tablet Commonly known as:  PRINZIDE,ZESTORETIC Take 2 tablets by mouth daily. HOLD until PCP follow-up What changed:  additional instructions   metoprolol succinate 100 MG 24 hr tablet Commonly known as:  TOPROL-XL Take 100 mg by mouth 2 (two) times daily. Take with or immediately following a meal.   oseltamivir 75 MG capsule Commonly known as:  TAMIFLU Take 1 capsule (75 mg total) by mouth 2 (two) times daily. X 3 more days   temazepam 15 MG capsule Commonly known as:  RESTORIL Take 15 mg by mouth at bedtime.       Discharge Assessment: Vitals:   06/01/16 2133 06/02/16 0434  BP: 132/75 136/83  Pulse: 74 72  Resp: 19 18  Temp: 98.2 F (36.8 C) 98.2 F (36.8 C)   Skin clean, dry and intact without evidence of skin break down, no evidence of skin tears noted. IV catheter discontinued intact. Site without  signs and symptoms of complications - no redness or edema noted at insertion site, patient denies c/o pain - only slight tenderness at site.  Dressing with slight pressure applied.  D/c Instructions-Education: Discharge instructions given to patient/family with verbalized understanding. D/c education completed with patient/family including follow up instructions, medication list, d/c activities limitations if indicated, with other d/c instructions as indicated by MD - patient able to verbalize understanding, all questions fully answered. Patient instructed to return to ED, call 911, or call MD for any changes in condition.  Patient escorted via [place], and D/C home via private auto.  Salley Slaughter, RN 06/02/2016 3:44 PM

## 2016-06-02 NOTE — Discharge Summary (Signed)
Physician Discharge Summary   Patient ID: LINET BRASH MRN: [id number] DOB/AGE: 06-05-1940 76 y.o.  Admit date: 05/30/2016 Discharge date: 06/02/2016  Primary Care Physician:  Benito Mccreedy, MD  Discharge Diagnoses:    . Influenza A . Hypothyroidism . Hypertension . Hypokalemia . Leukopenia . Thrombocytopenia ([place]) . Hypertensive urgency . Transaminasemia . generalized debility     Consults:  None   Recommendations for Outpatient Follow-up:  1. Home health PT, OT, RN, home health aide to be arranged by case management    2. Please repeat CBC/BMET at next visit   DIET: heart healthy diet     Allergies:  No Known Allergies   DISCHARGE MEDICATIONS: Current Discharge Medication List    START taking these medications   Details  levofloxacin (LEVAQUIN) 500 MG tablet Take 1 tablet (500 mg total) by mouth daily. X 3 days Qty: 3 tablet, Refills: 0    oseltamivir (TAMIFLU) 75 MG capsule Take 1 capsule (75 mg total) by mouth 2 (two) times daily. X 3 more days Qty: 6 capsule, Refills: 0      CONTINUE these medications which have CHANGED   Details  lisinopril-hydrochlorothiazide (PRINZIDE,ZESTORETIC) 20-25 MG tablet Take 2 tablets by mouth daily. HOLD until PCP follow-up      CONTINUE these medications which have NOT CHANGED   Details  cloNIDine (CATAPRES) 0.1 MG tablet Take 0.1 mg by mouth at bedtime.    diphenhydramine-acetaminophen (TYLENOL PM) 25-500 MG TABS tablet Take 1 tablet by mouth daily as needed (for pain).    folic acid (FOLVITE) 242 MCG tablet Take 400 mcg by mouth daily.    guaifenesin (ROBITUSSIN) 100 MG/5ML syrup Take 200 mg by mouth 3 (three) times daily as needed for cough.    levothyroxine (SYNTHROID, LEVOTHROID) 75 MCG tablet Take 75 mcg by mouth daily before breakfast.    metoprolol succinate (TOPROL-XL) 100 MG 24 hr tablet Take 100 mg by mouth 2 (two) times daily. Take with or immediately following a meal.    temazepam (RESTORIL) 15  MG capsule Take 15 mg by mouth at bedtime.         Brief H and P: For complete details please refer to admission H and P, but in brief Patient is a 76 year old female with hypertension, hypothyroidism presented with generalized weakness, nausea, diarrhea, chills for the last 2 days. She denied any fevers but noted chills with shakes, mild cough. In the ED she had a fever of 102.7, heart rate of 100, blood pressure 171/100, hypoxia with O2 sats of 87% on room air. Potassium was low at 2.6, patient was admitted for further workup.  Hospital Course:   Influenza A  - Sepsis ruled out, patient met SIRS criteria with fevers, leukopenia, tachycardia, hypoxia, lactic acidosis - Influenza panel was positive for influenza A. - Patient was started on Tamiflu, blood cultures negative so far   Hypokalemia  - Improved, replaced, likely secondary to diarrhea    Leukopenia, thrombocytopenia  - WBC 3,000 and platelets 105,000 on admission, possibly due to alcohol - Hepatitis panel negative, HIV negative   Elevated AST  - AST 127 on admission with normal ALT and normal bilirubin - There is no RUQ tenderness  - Possibly secondary to alcohol abuse as reported by her family  - Hepatitis panel negative  Hypertension  - BP now stable. Continue clonidine and Toprol  - Hold HCTZ and lisinopril due to soft BP , until seen by PCP at her follow-up appointment.  Patient was seen by PT  for evaluation. She was recommended by skilled nursing facility however patient refused facility. Home health PT, OT, RN, home health aide will arrange. Patient is completely alert and oriented 3 to make her decisions.   Day of Discharge BP 136/83 (BP Location: Left Arm)   Pulse 72   Temp 98.2 F (36.8 C)   Resp 18   Ht _0  (1.702 m)   Wt 82.7 kg (182 lb 5.1 oz)   SpO2 95%   BMI 28.56 kg/m   Physical Exam: General: Alert and awake oriented x3 not in any acute distress. HEENT: anicteric sclera, pupils  reactive to light and accommodation CVS: S1-S2 clear no murmur rubs or gallops Chest: clear to auscultation bilaterally, no wheezing rales or rhonchi Abdomen: soft nontender, nondistended, normal bowel sounds Extremities: no cyanosis, clubbing or edema noted bilaterally Neuro: Cranial nerves II-XII intact, no focal neurological deficits   The results of significant diagnostics from this hospitalization (including imaging, microbiology, ancillary and laboratory) are listed below for reference.    LAB RESULTS: Basic Metabolic Panel:  Recent Labs Lab 05/31/16 0532 06/01/16 0635 06/02/16 0309  NA 139 138 139  K 3.1* 3.4* 3.7  CL 105 105 107  CO2 _1 GLUCOSE 98 100* 105*  BUN _2 CREATININE 0.74 0.76 0.72  CALCIUM 8.2* 8.7* 9.0  MG 1.8  --   --    Liver Function Tests:  Recent Labs Lab 05/30/16 1526 05/31/16 0532  AST 127* 143*  ALT 52 57*  ALKPHOS 48 40  BILITOT 0.9 0.7  PROT 8.6* 7.0  ALBUMIN 3.8 3.0*   No results for input(s): LIPASE, AMYLASE in the last 168 hours. No results for input(s): AMMONIA in the last 168 hours. CBC:  Recent Labs Lab 05/30/16 1526 06/01/16 0635  WBC 3.5* 2.2*  NEUTROABS 3.1  --   HGB 13.7 12.2  HCT 42.0 38.3  MCV 92.5 92.7  PLT 105* 79*   Cardiac Enzymes: No results for input(s): CKTOTAL, CKMB, CKMBINDEX, TROPONINI in the last 168 hours. BNP: Invalid input(s): POCBNP CBG:  Recent Labs Lab 06/01/16 0833 06/02/16 0820  GLUCAP 97 106*    Significant Diagnostic Studies:  Ct Head Wo Contrast  Result Date: 05/30/2016 CLINICAL DATA:  Acute onset of generalized weakness and left facial twitching. Initial encounter. EXAM: CT HEAD WITHOUT CONTRAST TECHNIQUE: Contiguous axial images were obtained from the base of the skull through the vertex without intravenous contrast. COMPARISON:  MRI of the brain performed 01/09/2006 FINDINGS: Brain: No evidence of acute infarction, hemorrhage, hydrocephalus, extra-axial collection or  mass lesion/mass effect. Prominence of the sulci suggests mild cortical volume loss. Chronic encephalomalacia is noted at the left temporal lobe, with overlying postoperative change. Chronic ischemic change is noted at the external capsule bilaterally. The brainstem and fourth ventricle are within normal limits. No mass effect or midline shift is seen. Vascular: No hyperdense vessel or unexpected calcification. Skull: There is no evidence of fracture; visualized osseous structures are unremarkable in appearance. The patient is status post left frontoparietal craniotomy. Sinuses/Orbits: The orbits are within normal limits. The paranasal sinuses and mastoid air cells are well-aerated. Other: No significant soft tissue abnormalities are seen. IMPRESSION: 1. No acute intracranial pathology seen on CT. 2. Mild cortical volume loss and chronic ischemic change at the external capsule bilaterally. 3. Chronic encephalomalacia at the left temporal lobe, with overlying postoperative change. Electronically Signed   By: Garald Balding M.D.   On: 05/30/2016 18:11   Dg Chest  Port 1 View  Result Date: 05/30/2016 CLINICAL DATA:  76 year old female with fever weakness and cough for 3 days. Initial encounter. EXAM: PORTABLE CHEST 1 VIEW COMPARISON:  Chest radiographs 10/24/2005. FINDINGS: Portable AP semi upright view at 1552 hours. Lower lung volumes. Stable cardiomegaly and mediastinal contours. Calcified aortic atherosclerosis. Visualized tracheal air column is within normal limits. No pneumothorax, pleural effusion or consolidation. Pulmonary vascularity and/or crowding of lung markings are increased but without acute edema or convincing confluent opacity. Chronic epigastric surgical clips. IMPRESSION: Low lung volumes, otherwise no acute cardiopulmonary abnormality. Electronically Signed   By: Genevie Ann M.D.   On: 05/30/2016 16:13    2D ECHO:   Disposition and Follow-up: Discharge Instructions    Diet - low sodium heart  healthy    Complete by:  As directed    Increase activity slowly    Complete by:  As directed        DISPOSITION: home    DISCHARGE FOLLOW-UP Follow-up Information    OSEI-BONSU,GEORGE, MD. Schedule an appointment as soon as possible for a visit in 2 week(s).   Specialty:  Internal Medicine Contact information: [address] [place] [address] [phone]            Time spent on Discharge: 82mns   Signed:   RAI,RIPUDEEP M.D. [facility] 06/02/2016, 9:39 AM Pager: [phone]

## 2016-06-04 LAB — CULTURE, BLOOD (ROUTINE X 2)
Culture: NO GROWTH
Culture: NO GROWTH

## 2016-06-05 DIAGNOSIS — E039 Hypothyroidism, unspecified: Secondary | ICD-10-CM | POA: Diagnosis not present

## 2016-06-05 DIAGNOSIS — Z8719 Personal history of other diseases of the digestive system: Secondary | ICD-10-CM | POA: Diagnosis not present

## 2016-06-05 DIAGNOSIS — E876 Hypokalemia: Secondary | ICD-10-CM | POA: Diagnosis not present

## 2016-06-05 DIAGNOSIS — I10 Essential (primary) hypertension: Secondary | ICD-10-CM | POA: Diagnosis not present

## 2016-06-05 DIAGNOSIS — Z86011 Personal history of benign neoplasm of the brain: Secondary | ICD-10-CM | POA: Diagnosis not present

## 2016-06-08 DIAGNOSIS — E876 Hypokalemia: Secondary | ICD-10-CM | POA: Diagnosis not present

## 2016-06-08 DIAGNOSIS — Z8719 Personal history of other diseases of the digestive system: Secondary | ICD-10-CM | POA: Diagnosis not present

## 2016-06-08 DIAGNOSIS — I10 Essential (primary) hypertension: Secondary | ICD-10-CM | POA: Diagnosis not present

## 2016-06-08 DIAGNOSIS — E039 Hypothyroidism, unspecified: Secondary | ICD-10-CM | POA: Diagnosis not present

## 2016-06-08 DIAGNOSIS — Z86011 Personal history of benign neoplasm of the brain: Secondary | ICD-10-CM | POA: Diagnosis not present

## 2016-06-10 DIAGNOSIS — K5900 Constipation, unspecified: Secondary | ICD-10-CM | POA: Diagnosis not present

## 2016-06-10 DIAGNOSIS — M1712 Unilateral primary osteoarthritis, left knee: Secondary | ICD-10-CM | POA: Diagnosis not present

## 2016-06-10 DIAGNOSIS — I10 Essential (primary) hypertension: Secondary | ICD-10-CM | POA: Diagnosis not present

## 2016-06-10 DIAGNOSIS — E039 Hypothyroidism, unspecified: Secondary | ICD-10-CM | POA: Diagnosis not present

## 2016-06-10 DIAGNOSIS — G4709 Other insomnia: Secondary | ICD-10-CM | POA: Diagnosis not present

## 2016-06-12 DIAGNOSIS — Z8719 Personal history of other diseases of the digestive system: Secondary | ICD-10-CM | POA: Diagnosis not present

## 2016-06-12 DIAGNOSIS — E876 Hypokalemia: Secondary | ICD-10-CM | POA: Diagnosis not present

## 2016-06-12 DIAGNOSIS — E039 Hypothyroidism, unspecified: Secondary | ICD-10-CM | POA: Diagnosis not present

## 2016-06-12 DIAGNOSIS — I10 Essential (primary) hypertension: Secondary | ICD-10-CM | POA: Diagnosis not present

## 2016-06-12 DIAGNOSIS — Z86011 Personal history of benign neoplasm of the brain: Secondary | ICD-10-CM | POA: Diagnosis not present

## 2016-06-13 ENCOUNTER — Emergency Department (HOSPITAL_COMMUNITY): Payer: Medicare Other

## 2016-06-13 ENCOUNTER — Inpatient Hospital Stay (HOSPITAL_COMMUNITY)
Admission: EM | Admit: 2016-06-13 | Discharge: 2016-06-16 | DRG: 983 | Disposition: A | Discharge status: Home Health | Payer: Medicare Other | Attending: General Surgery | Admitting: General Surgery

## 2016-06-13 ENCOUNTER — Inpatient Hospital Stay (HOSPITAL_COMMUNITY): Payer: Medicare Other | Admitting: Certified Registered Nurse Anesthetist

## 2016-06-13 ENCOUNTER — Encounter (HOSPITAL_COMMUNITY): Payer: Self-pay | Admitting: Emergency Medicine

## 2016-06-13 ENCOUNTER — Encounter (HOSPITAL_COMMUNITY): Admission: EM | Disposition: A | Discharge status: Home Health | Payer: Self-pay | Source: Home / Self Care

## 2016-06-13 DIAGNOSIS — R404 Transient alteration of awareness: Secondary | ICD-10-CM | POA: Diagnosis not present

## 2016-06-13 DIAGNOSIS — Z86011 Personal history of benign neoplasm of the brain: Secondary | ICD-10-CM | POA: Diagnosis not present

## 2016-06-13 DIAGNOSIS — F17210 Nicotine dependence, cigarettes, uncomplicated: Secondary | ICD-10-CM | POA: Diagnosis present

## 2016-06-13 DIAGNOSIS — K610 Anal abscess: Secondary | ICD-10-CM | POA: Diagnosis not present

## 2016-06-13 DIAGNOSIS — K611 Rectal abscess: Secondary | ICD-10-CM | POA: Diagnosis not present

## 2016-06-13 DIAGNOSIS — A419 Sepsis, unspecified organism: Secondary | ICD-10-CM | POA: Diagnosis not present

## 2016-06-13 DIAGNOSIS — R531 Weakness: Secondary | ICD-10-CM | POA: Diagnosis not present

## 2016-06-13 DIAGNOSIS — M1991 Primary osteoarthritis, unspecified site: Secondary | ICD-10-CM | POA: Diagnosis present

## 2016-06-13 DIAGNOSIS — I10 Essential (primary) hypertension: Secondary | ICD-10-CM | POA: Diagnosis not present

## 2016-06-13 DIAGNOSIS — Z8711 Personal history of peptic ulcer disease: Secondary | ICD-10-CM

## 2016-06-13 DIAGNOSIS — K6289 Other specified diseases of anus and rectum: Secondary | ICD-10-CM | POA: Diagnosis present

## 2016-06-13 DIAGNOSIS — K604 Rectal fistula: Secondary | ICD-10-CM | POA: Diagnosis not present

## 2016-06-13 DIAGNOSIS — E039 Hypothyroidism, unspecified: Secondary | ICD-10-CM | POA: Diagnosis present

## 2016-06-13 DIAGNOSIS — E119 Type 2 diabetes mellitus without complications: Secondary | ICD-10-CM | POA: Diagnosis not present

## 2016-06-13 HISTORY — PX: INCISION AND DRAINAGE PERIRECTAL ABSCESS: SHX1804

## 2016-06-13 LAB — CBC WITH DIFFERENTIAL/PLATELET
BASOS PCT: 0 %
Basophils Absolute: 0 10*3/uL (ref 0.0–0.1)
EOS ABS: 0 10*3/uL (ref 0.0–0.7)
EOS PCT: 0 %
HCT: 36.3 % (ref 36.0–46.0)
Hemoglobin: 11.9 g/dL — ABNORMAL LOW (ref 12.0–15.0)
LYMPHS PCT: 10 %
Lymphs Abs: 1.3 10*3/uL (ref 0.7–4.0)
MCH: 28.6 pg (ref 26.0–34.0)
MCHC: 32.8 g/dL (ref 30.0–36.0)
MCV: 87.3 fL (ref 78.0–100.0)
Monocytes Absolute: 1.4 10*3/uL — ABNORMAL HIGH (ref 0.1–1.0)
Monocytes Relative: 11 %
Neutro Abs: 10.6 10*3/uL — ABNORMAL HIGH (ref 1.7–7.7)
Neutrophils Relative %: 79 %
PLATELETS: 408 10*3/uL — AB (ref 150–400)
RBC: 4.16 MIL/uL (ref 3.87–5.11)
RDW: 12.4 % (ref 11.5–15.5)
WBC: 13.4 10*3/uL — AB (ref 4.0–10.5)

## 2016-06-13 LAB — URINALYSIS, ROUTINE W REFLEX MICROSCOPIC
Bacteria, UA: NONE SEEN
Bilirubin Urine: NEGATIVE
Glucose, UA: NEGATIVE mg/dL
Ketones, ur: 5 mg/dL — AB
Leukocytes, UA: NEGATIVE
Nitrite: NEGATIVE
PH: 6 (ref 5.0–8.0)
Protein, ur: NEGATIVE mg/dL
SPECIFIC GRAVITY, URINE: 1.018 (ref 1.005–1.030)
SQUAMOUS EPITHELIAL / LPF: NONE SEEN

## 2016-06-13 LAB — COMPREHENSIVE METABOLIC PANEL
ALT: 20 U/L (ref 14–54)
AST: 26 U/L (ref 15–41)
Albumin: 3.7 g/dL (ref 3.5–5.0)
Alkaline Phosphatase: 57 U/L (ref 38–126)
Anion gap: 11 (ref 5–15)
BILIRUBIN TOTAL: 1.4 mg/dL — AB (ref 0.3–1.2)
BUN: 18 mg/dL (ref 6–20)
CO2: 29 mmol/L (ref 22–32)
CREATININE: 0.98 mg/dL (ref 0.44–1.00)
Calcium: 9.4 mg/dL (ref 8.9–10.3)
Chloride: 99 mmol/L — ABNORMAL LOW (ref 101–111)
GFR calc Af Amer: >60 mL/min (ref >60)
GFR, EST NON AFRICAN AMERICAN: 55 mL/min — AB (ref >60)
Glucose, Bld: 148 mg/dL — ABNORMAL HIGH (ref 65–99)
POTASSIUM: 3.1 mmol/L — AB (ref 3.5–5.1)
Sodium: 139 mmol/L (ref 135–145)
Total Protein: 9.5 g/dL — ABNORMAL HIGH (ref 6.5–8.1)

## 2016-06-13 LAB — I-STAT CG4 LACTIC ACID, ED
LACTIC ACID, VENOUS: 1.05 mmol/L (ref 0.5–1.9)
Lactic Acid, Venous: 2.34 mmol/L (ref 0.5–1.9)

## 2016-06-13 LAB — I-STAT TROPONIN, ED: TROPONIN I, POC: 0.01 ng/mL (ref 0.00–0.08)

## 2016-06-13 LAB — POC OCCULT BLOOD, ED: FECAL OCCULT BLD: NEGATIVE

## 2016-06-13 SURGERY — INCISION AND DRAINAGE, ABSCESS, PERIRECTAL
Anesthesia: General

## 2016-06-13 MED ORDER — IOPAMIDOL (ISOVUE-300) INJECTION 61%
100.0000 mL | Freq: Once | INTRAVENOUS | Status: AC | PRN
  Administered 2016-06-13: 100 mL via INTRAVENOUS

## 2016-06-13 MED ORDER — HYDROCHLOROTHIAZIDE 25 MG PO TABS
25.0000 mg | ORAL_TABLET | Freq: Every day | ORAL | Status: DC
  Administered 2016-06-14 – 2016-06-16 (×3): 25 mg via ORAL
  Filled 2016-06-13 (×3): qty 1

## 2016-06-13 MED ORDER — FENTANYL CITRATE (PF) 100 MCG/2ML IJ SOLN
INTRAMUSCULAR | Status: AC
  Filled 2016-06-13: qty 2

## 2016-06-13 MED ORDER — FENTANYL CITRATE (PF) 100 MCG/2ML IJ SOLN
INTRAMUSCULAR | Status: DC | PRN
  Administered 2016-06-13 (×2): 50 ug via INTRAVENOUS

## 2016-06-13 MED ORDER — LEVOTHYROXINE SODIUM 75 MCG PO TABS
75.0000 ug | ORAL_TABLET | Freq: Every day | ORAL | Status: DC
  Administered 2016-06-14 – 2016-06-16 (×3): 75 ug via ORAL
  Filled 2016-06-13 (×3): qty 1

## 2016-06-13 MED ORDER — ONDANSETRON HCL 4 MG/2ML IJ SOLN
INTRAMUSCULAR | Status: DC | PRN
  Administered 2016-06-13: 4 mg via INTRAVENOUS

## 2016-06-13 MED ORDER — BUPIVACAINE HCL (PF) 0.25 % IJ SOLN
INTRAMUSCULAR | Status: DC | PRN
  Administered 2016-06-13: 10 mL

## 2016-06-13 MED ORDER — PIPERACILLIN-TAZOBACTAM 3.375 G IVPB
3.3750 g | Freq: Three times a day (TID) | INTRAVENOUS | Status: DC
  Administered 2016-06-14 – 2016-06-16 (×8): 3.375 g via INTRAVENOUS
  Filled 2016-06-13 (×8): qty 50

## 2016-06-13 MED ORDER — MORPHINE SULFATE (PF) 4 MG/ML IV SOLN
4.0000 mg | Freq: Once | INTRAVENOUS | Status: AC
  Administered 2016-06-13: 4 mg via INTRAVENOUS
  Filled 2016-06-13: qty 1

## 2016-06-13 MED ORDER — METOPROLOL SUCCINATE ER 50 MG PO TB24
100.0000 mg | ORAL_TABLET | Freq: Two times a day (BID) | ORAL | Status: DC
  Administered 2016-06-14 – 2016-06-16 (×6): 100 mg via ORAL
  Filled 2016-06-13 (×6): qty 2

## 2016-06-13 MED ORDER — KCL IN DEXTROSE-NACL 20-5-0.9 MEQ/L-%-% IV SOLN
INTRAVENOUS | Status: DC
  Administered 2016-06-14: via INTRAVENOUS
  Filled 2016-06-13 (×2): qty 1000

## 2016-06-13 MED ORDER — BUPIVACAINE HCL (PF) 0.25 % IJ SOLN
INTRAMUSCULAR | Status: AC
  Filled 2016-06-13: qty 30

## 2016-06-13 MED ORDER — LIDOCAINE 2% (20 MG/ML) 5 ML SYRINGE
INTRAMUSCULAR | Status: DC | PRN
  Administered 2016-06-13: 60 mg via INTRAVENOUS

## 2016-06-13 MED ORDER — MORPHINE SULFATE (PF) 2 MG/ML IV SOLN: 1.0000 mg | INTRAVENOUS | Status: DC | PRN

## 2016-06-13 MED ORDER — CLONIDINE HCL 0.1 MG PO TABS
0.1000 mg | ORAL_TABLET | Freq: Every day | ORAL | Status: DC
  Administered 2016-06-14 – 2016-06-15 (×3): 0.1 mg via ORAL
  Filled 2016-06-13 (×3): qty 1

## 2016-06-13 MED ORDER — IOPAMIDOL (ISOVUE-300) INJECTION 61%
INTRAVENOUS | Status: AC
  Filled 2016-06-13: qty 100

## 2016-06-13 MED ORDER — ONDANSETRON HCL 4 MG/2ML IJ SOLN: 4.0000 mg | Freq: Four times a day (QID) | INTRAMUSCULAR | Status: DC | PRN

## 2016-06-13 MED ORDER — PIPERACILLIN-TAZOBACTAM 3.375 G IVPB
3.3750 g | Freq: Once | INTRAVENOUS | Status: AC
  Administered 2016-06-13: 3.375 g via INTRAVENOUS
  Filled 2016-06-13: qty 50

## 2016-06-13 MED ORDER — LISINOPRIL 20 MG PO TABS
20.0000 mg | ORAL_TABLET | Freq: Every day | ORAL | Status: DC
  Administered 2016-06-14 – 2016-06-16 (×3): 20 mg via ORAL
  Filled 2016-06-13 (×3): qty 1

## 2016-06-13 MED ORDER — ONDANSETRON 4 MG PO TBDP: 4.0000 mg | ORAL_TABLET | Freq: Four times a day (QID) | ORAL | Status: DC | PRN

## 2016-06-13 MED ORDER — PROPOFOL 10 MG/ML IV BOLUS
INTRAVENOUS | Status: DC | PRN
  Administered 2016-06-13: 40 mg via INTRAVENOUS
  Administered 2016-06-13: 140 mg via INTRAVENOUS

## 2016-06-13 MED ORDER — HEPARIN SODIUM (PORCINE) 5000 UNIT/ML IJ SOLN
5000.0000 [IU] | Freq: Three times a day (TID) | INTRAMUSCULAR | Status: DC
  Administered 2016-06-14 – 2016-06-16 (×7): 5000 [IU] via SUBCUTANEOUS
  Filled 2016-06-13 (×7): qty 1

## 2016-06-13 MED ORDER — FENTANYL CITRATE (PF) 100 MCG/2ML IJ SOLN
25.0000 ug | INTRAMUSCULAR | Status: DC | PRN
  Administered 2016-06-13 (×2): 50 ug via INTRAVENOUS

## 2016-06-13 MED ORDER — SODIUM CHLORIDE 0.9 % IV BOLUS (SEPSIS)
1000.0000 mL | Freq: Once | INTRAVENOUS | Status: AC
  Administered 2016-06-13: 1000 mL via INTRAVENOUS

## 2016-06-13 MED ORDER — PROPOFOL 10 MG/ML IV BOLUS
INTRAVENOUS | Status: AC
  Filled 2016-06-13: qty 20

## 2016-06-13 MED ORDER — TEMAZEPAM 15 MG PO CAPS: 15.0000 mg | ORAL_CAPSULE | Freq: Every evening | ORAL | Status: DC | PRN

## 2016-06-13 MED ORDER — SODIUM CHLORIDE 0.9 % IR SOLN
Status: DC | PRN
  Administered 2016-06-13: 1000 mL

## 2016-06-13 MED ORDER — PANTOPRAZOLE SODIUM 40 MG IV SOLR
40.0000 mg | Freq: Every day | INTRAVENOUS | Status: DC
  Administered 2016-06-14: 40 mg via INTRAVENOUS
  Filled 2016-06-13: qty 40

## 2016-06-13 MED ORDER — LISINOPRIL-HYDROCHLOROTHIAZIDE 20-25 MG PO TABS: 2.0000 | ORAL_TABLET | Freq: Every day | ORAL | Status: DC

## 2016-06-13 MED ORDER — SODIUM CHLORIDE 0.9 % IJ SOLN
INTRAMUSCULAR | Status: AC
  Filled 2016-06-13: qty 50

## 2016-06-13 MED ORDER — LACTATED RINGERS IV SOLN
INTRAVENOUS | Status: DC | PRN
  Administered 2016-06-13: 22:00:00 via INTRAVENOUS

## 2016-06-13 MED ORDER — HYDROCODONE-ACETAMINOPHEN 5-325 MG PO TABS
1.0000 | ORAL_TABLET | ORAL | Status: DC | PRN
  Administered 2016-06-15 – 2016-06-16 (×3): 2 via ORAL
  Filled 2016-06-13 (×3): qty 2

## 2016-06-13 SURGICAL SUPPLY — 21 items
BLADE SURG 15 STRL LF DISP TIS (BLADE) ×1 IMPLANT
BLADE SURG 15 STRL SS (BLADE) ×2
BNDG GAUZE ELAST 4 BULKY (GAUZE/BANDAGES/DRESSINGS) ×3 IMPLANT
CLEANER TIP ELECTROSURG 2X2 (MISCELLANEOUS) IMPLANT
COVER SURGICAL LIGHT HANDLE (MISCELLANEOUS) ×6 IMPLANT
DRSG PAD ABDOMINAL 8X10 ST (GAUZE/BANDAGES/DRESSINGS) ×3 IMPLANT
ELECT PENCIL ROCKER SW 15FT (MISCELLANEOUS) IMPLANT
ELECT REM PT RETURN 9FT ADLT (ELECTROSURGICAL)
ELECTRODE REM PT RTRN 9FT ADLT (ELECTROSURGICAL) IMPLANT
GAUZE SPONGE 4X4 12PLY STRL (GAUZE/BANDAGES/DRESSINGS) ×3 IMPLANT
GAUZE SPONGE 4X4 16PLY XRAY LF (GAUZE/BANDAGES/DRESSINGS) ×3 IMPLANT
GLOVE BIO SURGEON STRL SZ7.5 (GLOVE) ×3 IMPLANT
GOWN STRL REUS W/TWL LRG LVL3 (GOWN DISPOSABLE) ×3 IMPLANT
KIT BASIN OR (CUSTOM PROCEDURE TRAY) ×3 IMPLANT
NS IRRIG 1000ML POUR BTL (IV SOLUTION) ×3 IMPLANT
PACK LITHOTOMY IV (CUSTOM PROCEDURE TRAY) ×3 IMPLANT
SWAB COLLECTION DEVICE MRSA (MISCELLANEOUS) ×3 IMPLANT
SWAB CULTURE ESWAB REG 1ML (MISCELLANEOUS) ×3 IMPLANT
TOWEL OR 17X26 10 PK STRL BLUE (TOWEL DISPOSABLE) ×3 IMPLANT
UNDERPAD 30X30 INCONTINENT (UNDERPADS AND DIAPERS) ×6 IMPLANT
YANKAUER SUCT BULB TIP NO VENT (SUCTIONS) ×3 IMPLANT

## 2016-06-13 NOTE — ED Notes (Signed)
Pt clothes and diapers will remain in ed because their is no family to take them.

## 2016-06-13 NOTE — ED Triage Notes (Signed)
Per [facility] pt from home rectal pain , weakness . Recent acute diarrhea. No HX hemorrhoids nor rectal bleed. A and O x 4. Decreased appetite x 4 days.

## 2016-06-13 NOTE — Transfer of Care (Signed)
Immediate Anesthesia Transfer of Care Note  Patient: Heather Wilcox  Procedure(s) Performed: Procedure(s): IRRIGATION AND DEBRIDEMENT PERIRECTAL ABSCESS (N/A)  Patient Location: PACU  Anesthesia Type:General  Level of Consciousness: sedated, patient cooperative and responds to stimulation  Airway & Oxygen Therapy: Patient Spontanous Breathing and Patient connected to face mask oxygen  Post-op Assessment: Report given to RN and Post -op Vital signs reviewed and stable  Post vital signs: Reviewed and stable  Last Vitals:  Vitals:   06/13/16 1920 06/13/16 2048  BP: 157/86 168/87  Pulse: 97 111  Resp: 18 18  Temp:      Last Pain:  Vitals:   06/13/16 1608  TempSrc: Oral         Complications: No apparent anesthesia complications

## 2016-06-13 NOTE — ED Provider Notes (Signed)
[facility] DEPT Provider Note   CSN: [id number] Arrival date & time: 06/13/16 1444     History    Chief Complaint  Patient presents with  . Rectal Pain     HPI Heather Wilcox is a 76 y.o. female.  76yo F w/ PMH incluidng HTN, hypothyroidism, recent influenza A who p/w weakness and rectal pain. Patient was discharged from the hospital on 2/4 after an admission for influenza. She states that she felt better at time of discharge but has had several days of worsening rectal pain. The pain is severe and worse before and after bowel movements but is also present at rest. She has had some nonbloody diarrhea recently. No associated nausea or vomiting, and no complaints of abdominal pain. She reports a decreased appetite for the past 4 days and generalized weakness. She denies any chest pain, shortness of breath, fevers, urinary symptoms. Occasional cough but no worsening cough.   Past Medical History:  Diagnosis Date  . History of stomach ulcers   . Hypertension   . Hypothyroidism      Patient Active Problem List   Diagnosis Date Noted  . Influenza-like illness 05/31/2016  . Influenza A 05/30/2016  . Hypokalemia 05/30/2016  . Leukopenia 05/30/2016  . Thrombocytopenia ([place]) 05/30/2016  . Hypertensive urgency 05/30/2016  . Transaminasemia 05/30/2016  . MENINGIOMA 10/06/2006  . Hypothyroidism 10/06/2006  . DM 10/06/2006  . GLAUCOMA, PRIMARY OPEN-ANGLE 10/06/2006  . Hypertension 10/06/2006  . DEGENERATIVE JOINT DISEASE, LEFT KNEE 10/06/2006  . THYROIDECTOMY, HX OF 10/06/2006    Past Surgical History:  Procedure Laterality Date  . CRANIOTOMY FOR TUMOR Left 04/2004    temporal craniotomy for meningioma with microdissection/notes 09/11/2010  . REPAIR OF PERFORATED ULCER     "bleeding stomach ulcer"  . TOTAL THYROIDECTOMY      OB History    No data available        Home Medications    Prior to Admission medications   Medication Sig Start Date End Date Taking?  Authorizing Provider  cloNIDine (CATAPRES) 0.1 MG tablet Take 0.1 mg by mouth at bedtime.    Historical Provider, MD  diphenhydramine-acetaminophen (TYLENOL PM) 25-500 MG TABS tablet Take 1 tablet by mouth daily as needed (for pain).    Historical Provider, MD  folic acid (FOLVITE) A999333 MCG tablet Take 400 mcg by mouth daily.    Historical Provider, MD  guaifenesin (ROBITUSSIN) 100 MG/5ML syrup Take 200 mg by mouth 3 (three) times daily as needed for cough.    Historical Provider, MD  levofloxacin (LEVAQUIN) 500 MG tablet Take 1 tablet (500 mg total) by mouth daily. X 3 days 06/02/16   Ripudeep Krystal Eaton, MD  levothyroxine (SYNTHROID, LEVOTHROID) 75 MCG tablet Take 75 mcg by mouth daily before breakfast.    Historical Provider, MD  lisinopril-hydrochlorothiazide (PRINZIDE,ZESTORETIC) 20-25 MG tablet Take 2 tablets by mouth daily. HOLD until PCP follow-up 06/01/16   Ripudeep Krystal Eaton, MD  metoprolol succinate (TOPROL-XL) 100 MG 24 hr tablet Take 100 mg by mouth 2 (two) times daily. Take with or immediately following a meal.    Historical Provider, MD  oseltamivir (TAMIFLU) 75 MG capsule Take 1 capsule (75 mg total) by mouth 2 (two) times daily. X 3 more days 06/02/16   Ripudeep K Rai, MD  temazepam (RESTORIL) 15 MG capsule Take 15 mg by mouth at bedtime.    Historical Provider, MD      Family History  Problem Relation Age of Onset  . Family history  unknown: Yes     Social History  Substance Use Topics  . Smoking status: Current Some Day Smoker    Packs/day: 0.10    Years: 50.00    Types: Cigarettes  . Smokeless tobacco: Never Used  . Alcohol use No     Allergies     Patient has no known allergies.    Review of Systems  10 Systems reviewed and are negative for acute change except as noted in the HPI.   Physical Exam Updated Vital Signs BP 153/80 (BP Location: Right Arm)   Pulse 108   Temp 101 F (38.3 C) (Rectal)   Resp 18   Ht 5\' 7"  (1.702 m)   Wt 181 lb (82.1 kg)   SpO2 96%    BMI 28.35 kg/m   Physical Exam  Constitutional: She is oriented to person, place, and time. She appears well-developed and well-nourished. No distress.  uncomfortable  HENT:  Head: Normocephalic and atraumatic.  Moist mucous membranes  Eyes: Conjunctivae are normal. Pupils are equal, round, and reactive to light.  Neck: Neck supple.  Cardiovascular: Normal rate, regular rhythm and normal heart sounds.   No murmur heard. Pulmonary/Chest: Effort normal and breath sounds normal.  Abdominal: Soft. Bowel sounds are normal. She exhibits no distension. There is no tenderness.  Genitourinary:     Genitourinary Comments: Induration, severe tenderness and fluctuance of inner R buttock w/ induration extending into R rectal wall  Musculoskeletal: She exhibits no edema.  Neurological: She is alert and oriented to person, place, and time.  Fluent speech  Skin: Skin is warm and dry.  Induration and exquisite tenderness R buttock  Psychiatric: She has a normal mood and affect. Judgment normal.  Nursing note and vitals reviewed.  Chaperone was present during exam.    ED Treatments / Results  Labs (all labs ordered are listed, but only abnormal results are displayed) Labs Reviewed  COMPREHENSIVE METABOLIC PANEL - Abnormal; Notable for the following:       Result Value   Potassium 3.1 (*)    Chloride 99 (*)    Glucose, Bld 148 (*)    Total Protein 9.5 (*)    Total Bilirubin 1.4 (*)    GFR calc non Af Amer 55 (*)    All other components within normal limits  CBC WITH DIFFERENTIAL/PLATELET - Abnormal; Notable for the following:    WBC 13.4 (*)    Hemoglobin 11.9 (*)    Platelets 408 (*)    Neutro Abs 10.6 (*)    Monocytes Absolute 1.4 (*)    All other components within normal limits  URINALYSIS, ROUTINE W REFLEX MICROSCOPIC - Abnormal; Notable for the following:    Color, Urine AMBER (*)    Hgb urine dipstick SMALL (*)    Ketones, ur 5 (*)    All other components within normal  limits  I-STAT CG4 LACTIC ACID, ED - Abnormal; Notable for the following:    Lactic Acid, Venous 2.34 (*)    All other components within normal limits  CULTURE, BLOOD (ROUTINE X 2)  CULTURE, BLOOD (ROUTINE X 2)  URINE CULTURE  AEROBIC/ANAEROBIC CULTURE (SURGICAL/DEEP WOUND)  POC OCCULT BLOOD, ED  I-STAT TROPOININ, ED  I-STAT CG4 LACTIC ACID, ED  I-STAT CG4 LACTIC ACID, ED     EKG  EKG Interpretation  Date/Time:  Thursday June 13 2016 16:05:46 EST Ventricular Rate:  90 PR Interval:    QRS Duration: 73 QT Interval:  442 QTC Calculation: 541 R Axis:   -  18 Text Interpretation:  Sinus or ectopic atrial rhythm LVH with secondary repolarization abnormality Anterior Q waves, possibly due to LVH Prolonged QT interval prolonged QT new from previous, however overall similar to previous Confirmed by Quinley Nesler MD, [place] [phone]) on 06/13/2016 4:21:14 PM         Radiology Ct Pelvis W Contrast  Result Date: 06/13/2016 CLINICAL DATA:  Rectal pain, diarrhea and decreased appetite for 4 days. EXAM: CT PELVIS WITH CONTRAST TECHNIQUE: Multidetector CT imaging of the pelvis was performed using the standard protocol following the bolus administration of intravenous contrast. CONTRAST:  1 ISOVUE-300 IOPAMIDOL (ISOVUE-300) INJECTION 61% COMPARISON:  None. FINDINGS: Urinary Tract: The ureters appear normal. No obstructing ureteral calculi or bladder calculi. No asymmetric bladder wall thickening or bladder mass. Bowel: The visualize colon and small bowel appear normal. Part of the appendix is visualized and appears normal. The terminal ileum is normal. No rectal mass or perirectal adenopathy. There is a large complex perianal/ perineal abscess. There appears to be the perianal fistula at the 5 o'clock position extending down into the complex abscess (5.8 x 4.6 cm). Findings most consistent with a grade 4 perianal fistula. Vascular/Lymphatic: The major vascular structures are patent. Distal aortic and iliac  artery calcifications are noted. No pelvic lymphadenopathy. Small scattered inguinal lymph nodes are noted. No intra pelvic abscess. Reproductive: The uterus and ovaries are grossly normal. The endometrium appears slightly thickened and irregular for age. Recommend correlation with pelvic ultrasound. Other:  No other significant findings. Musculoskeletal: The bony structures are intact. IMPRESSION: 1. CT findings most consistent with a grade 4 perianal fistula with a large complex perianal/perineal abscess. 2. No intrapelvic abscess. 3. Slightly thickened and irregular appearing endometrium. Recommend correlation with pelvic ultrasound. The ovaries appear normal. Electronically Signed   By: Marijo Sanes M.D.   On: 06/13/2016 18:32    Procedures Procedures (including critical care time) .Critical Care Performed by: Sharlett Iles Authorized by: Sharlett Iles   Critical care provider statement:    Critical care time (minutes):  35   Critical care time was exclusive of:  Separately billable procedures and treating other patients   Critical care was necessary to treat or prevent imminent or life-threatening deterioration of the following conditions:  Sepsis   Critical care was time spent personally by me on the following activities:  Development of treatment plan with patient or surrogate, discussions with consultants, evaluation of patient's response to treatment, examination of patient, obtaining history from patient or surrogate, ordering and performing treatments and interventions, ordering and review of laboratory studies, ordering and review of radiographic studies, re-evaluation of patient's condition and review of old charts    Medications Ordered in ED  Medications  sodium chloride 0.9 % bolus 1,000 mL (not administered)  piperacillin-tazobactam (ZOSYN) IVPB 3.375 g (not administered)     Initial Impression / Assessment and Plan / ED Course  I have reviewed the triage  vital signs and the nursing notes.  Pertinent labs & imaging results that were available during my care of the patient were reviewed by me and considered in my medical decision making (see chart for details).     Pt w/ several days of worsening rectal/ buttock pain associated w/ weakness, decreased appetite. She was nontoxic on exam but uncomfortable due to pain. Vital signs notable for fever of 101, heart rate 108, BP 153/80. She had exquisite tenderness of inner right buttock extending into right rectal wall, concerning for abscess especially given fever. Initiated a code  sepsis with blood and urine cultures, Zosyn, and an IV fluid bolus. Gave morphine for pain.  Labs notable for initial lactate of 2.34, WBC 13.4, creatinine 0.98. Obtained CT due to concern for extension of abscess. CT shows grade 4 perianal fistula with large perianal/perineal abscess. Contacted general surgery and discussed w/ Dr. Marlou Starks. Pt taken to OR for operative management.  Final Clinical Impressions(s) / ED Diagnoses   Final diagnoses:  Perianal abscess  Perirectal fistula  Sepsis, due to unspecified organism [facility])     New Prescriptions   No medications on file       Sharlett Iles, MD 06/14/16 0001

## 2016-06-13 NOTE — ED Notes (Signed)
Pt stripped, chg bath, belongings placed in bags.

## 2016-06-13 NOTE — Transfer of Care (Signed)
Immediate Anesthesia Transfer of Care Note  Patient: Heather Wilcox  Procedure(s) Performed: Procedure(s): IRRIGATION AND DEBRIDEMENT PERIRECTAL ABSCESS (N/A)  Patient Location: Last Vitals:  Vitals:   06/13/16 1920 06/13/16 2048  BP: 157/86 168/87  Pulse: 97 111  Resp: 18 18  Temp:      Last Pain:  Vitals:   06/13/16 1608  TempSrc: Oral         Complications:

## 2016-06-13 NOTE — Anesthesia Postprocedure Evaluation (Signed)
Anesthesia Post Note  Patient: TYSON COTTIER  Procedure(s) Performed: Procedure(s) (LRB): IRRIGATION AND DEBRIDEMENT PERIRECTAL ABSCESS (N/A)  Patient location during evaluation: PACU Anesthesia Type: General Level of consciousness: awake and alert Pain management: pain level controlled Vital Signs Assessment: post-procedure vital signs reviewed and stable Respiratory status: spontaneous breathing, nonlabored ventilation, respiratory function stable and patient connected to nasal cannula oxygen Cardiovascular status: blood pressure returned to baseline and stable Postop Assessment: no signs of nausea or vomiting Anesthetic complications: no       Last Vitals:  Vitals:   06/13/16 2315 06/13/16 2322  BP: (!) 182/105 (!) 183/99  Pulse: 96 95  Resp: 20 15  Temp:  36.8 C    Last Pain:  Vitals:   06/13/16 1608  TempSrc: Oral                 Marylouise Mallet,W. EDMOND

## 2016-06-13 NOTE — ED Notes (Signed)
Bed: WA17 Expected date:  Expected time:  Means of arrival:  Comments: 

## 2016-06-13 NOTE — H&P (Signed)
Heather Wilcox is an 76 y.o. female.   Chief Complaint: rectal pain HPI: The patient presents with left perirectal pain that has been going on for about a week. Denies fever or chills. CT shows a large perirectal abscess  Past Medical History:  Diagnosis Date  . History of stomach ulcers   . Hypertension   . Hypothyroidism     Past Surgical History:  Procedure Laterality Date  . CRANIOTOMY FOR TUMOR Left 04/2004    temporal craniotomy for meningioma with microdissection/notes 09/11/2010  . REPAIR OF PERFORATED ULCER     "bleeding stomach ulcer"  . TOTAL THYROIDECTOMY      Family History  Problem Relation Age of Onset  . Family history unknown: Yes   Social History:  reports that she has been smoking Cigarettes.  She has a 5.00 pack-year smoking history. She has never used smokeless tobacco. She reports that she does not drink alcohol or use drugs.  Allergies: No Known Allergies   (Not in a hospital admission)  Results for orders placed or performed during the hospital encounter of 06/13/16 (from the past 48 hour(s))  Comprehensive metabolic panel     Status: Abnormal   Collection Time: 06/13/16  3:41 PM  Result Value Ref Range   Sodium 139 135 - 145 mmol/L   Potassium 3.1 (L) 3.5 - 5.1 mmol/L   Chloride 99 (L) 101 - 111 mmol/L   CO2 29 22 - 32 mmol/L   Glucose, Bld 148 (H) 65 - 99 mg/dL   BUN 18 6 - 20 mg/dL   Creatinine, Ser 0.98 0.44 - 1.00 mg/dL   Calcium 9.4 8.9 - 10.3 mg/dL   Total Protein 9.5 (H) 6.5 - 8.1 g/dL   Albumin 3.7 3.5 - 5.0 g/dL   AST 26 15 - 41 U/L   ALT 20 14 - 54 U/L   Alkaline Phosphatase 57 38 - 126 U/L   Total Bilirubin 1.4 (H) 0.3 - 1.2 mg/dL   GFR calc non Af Amer 55 (L) >60 mL/min   GFR calc Af Amer >60 >60 mL/min    Comment: (NOTE) The eGFR has been calculated using the CKD EPI equation. This calculation has not been validated in all clinical situations. eGFR's persistently <60 mL/min signify possible Chronic Kidney Disease.    Anion gap 11 5 - 15  CBC with Differential     Status: Abnormal   Collection Time: 06/13/16  3:41 PM  Result Value Ref Range   WBC 13.4 (H) 4.0 - 10.5 K/uL   RBC 4.16 3.87 - 5.11 MIL/uL   Hemoglobin 11.9 (L) 12.0 - 15.0 g/dL   HCT 36.3 36.0 - 46.0 %   MCV 87.3 78.0 - 100.0 fL   MCH 28.6 26.0 - 34.0 pg   MCHC 32.8 30.0 - 36.0 g/dL   RDW 12.4 11.5 - 15.5 %   Platelets 408 (H) 150 - 400 K/uL   Neutrophils Relative % 79 %   Neutro Abs 10.6 (H) 1.7 - 7.7 K/uL   Lymphocytes Relative 10 %   Lymphs Abs 1.3 0.7 - 4.0 K/uL   Monocytes Relative 11 %   Monocytes Absolute 1.4 (H) 0.1 - 1.0 K/uL   Eosinophils Relative 0 %   Eosinophils Absolute 0.0 0.0 - 0.7 K/uL   Basophils Relative 0 %   Basophils Absolute 0.0 0.0 - 0.1 K/uL  POC occult blood, ED Provider will collect     Status: None   Collection Time: 06/13/16  3:53 PM  Result Value Ref Range   Fecal Occult Bld NEGATIVE NEGATIVE  I-Stat CG4 Lactic Acid, ED     Status: Abnormal   Collection Time: 06/13/16  3:57 PM  Result Value Ref Range   Lactic Acid, Venous 2.34 (HH) 0.5 - 1.9 mmol/L   Comment NOTIFIED PHYSICIAN   Urinalysis, Routine w reflex microscopic     Status: Abnormal   Collection Time: 06/13/16  4:26 PM  Result Value Ref Range   Color, Urine AMBER (A) YELLOW    Comment: BIOCHEMICALS MAY BE AFFECTED BY COLOR   APPearance CLEAR CLEAR   Specific Gravity, Urine 1.018 1.005 - 1.030   pH 6.0 5.0 - 8.0   Glucose, UA NEGATIVE NEGATIVE mg/dL   Hgb urine dipstick SMALL (A) NEGATIVE   Bilirubin Urine NEGATIVE NEGATIVE   Ketones, ur 5 (A) NEGATIVE mg/dL   Protein, ur NEGATIVE NEGATIVE mg/dL   Nitrite NEGATIVE NEGATIVE   Leukocytes, UA NEGATIVE NEGATIVE   RBC / HPF 6-30 0 - 5 RBC/hpf   WBC, UA 0-5 0 - 5 WBC/hpf   Bacteria, UA NONE SEEN NONE SEEN   Squamous Epithelial / LPF NONE SEEN NONE SEEN   Mucous PRESENT    Hyaline Casts, UA PRESENT   I-stat troponin, ED     Status: None   Collection Time: 06/13/16  7:48 PM  Result Value  Ref Range   Troponin i, poc 0.01 0.00 - 0.08 ng/mL   Comment 3            Comment: Due to the release kinetics of cTnI, a negative result within the first hours of the onset of symptoms does not rule out myocardial infarction with certainty. If myocardial infarction is still suspected, repeat the test at appropriate intervals.   I-Stat CG4 Lactic Acid, ED     Status: None   Collection Time: 06/13/16  7:50 PM  Result Value Ref Range   Lactic Acid, Venous 1.05 0.5 - 1.9 mmol/L   Ct Pelvis W Contrast  Result Date: 06/13/2016 CLINICAL DATA:  Rectal pain, diarrhea and decreased appetite for 4 days. EXAM: CT PELVIS WITH CONTRAST TECHNIQUE: Multidetector CT imaging of the pelvis was performed using the standard protocol following the bolus administration of intravenous contrast. CONTRAST:  1 ISOVUE-300 IOPAMIDOL (ISOVUE-300) INJECTION 61% COMPARISON:  None. FINDINGS: Urinary Tract: The ureters appear normal. No obstructing ureteral calculi or bladder calculi. No asymmetric bladder wall thickening or bladder mass. Bowel: The visualize colon and small bowel appear normal. Part of the appendix is visualized and appears normal. The terminal ileum is normal. No rectal mass or perirectal adenopathy. There is a large complex perianal/ perineal abscess. There appears to be the perianal fistula at the 5 o'clock position extending down into the complex abscess (5.8 x 4.6 cm). Findings most consistent with a grade 4 perianal fistula. Vascular/Lymphatic: The major vascular structures are patent. Distal aortic and iliac artery calcifications are noted. No pelvic lymphadenopathy. Small scattered inguinal lymph nodes are noted. No intra pelvic abscess. Reproductive: The uterus and ovaries are grossly normal. The endometrium appears slightly thickened and irregular for age. Recommend correlation with pelvic ultrasound. Other:  No other significant findings. Musculoskeletal: The bony structures are intact. IMPRESSION: 1.  CT findings most consistent with a grade 4 perianal fistula with a large complex perianal/perineal abscess. 2. No intrapelvic abscess. 3. Slightly thickened and irregular appearing endometrium. Recommend correlation with pelvic ultrasound. The ovaries appear normal. Electronically Signed   By: Marijo Sanes M.D.   On: 06/13/2016  18:32    Review of Systems  Constitutional: Negative.   HENT: Negative.   Eyes: Negative.   Respiratory: Negative.   Cardiovascular: Negative.   Gastrointestinal: Negative.   Genitourinary: Negative.   Musculoskeletal: Negative.   Skin: Negative.   Neurological: Negative.   Endo/Heme/Allergies: Negative.   Psychiatric/Behavioral: Negative.     Blood pressure 168/87, pulse 111, temperature 98.2 F (36.8 C), temperature source Oral, resp. rate 18, height 5' 7" (1.702 m), weight 82.1 kg (181 lb), SpO2 99 %. Physical Exam  Constitutional: She is oriented to person, place, and time. She appears well-developed and well-nourished.  HENT:  Head: Normocephalic and atraumatic.  Eyes: Conjunctivae and EOM are normal. Pupils are equal, round, and reactive to light.  Neck: Normal range of motion. Neck supple.  Cardiovascular: Normal rate, regular rhythm and normal heart sounds.   Respiratory: Effort normal and breath sounds normal.  GI: Soft. Bowel sounds are normal. There is no tenderness.  Genitourinary:  Genitourinary Comments: Large swollen indurated area in left perirectal space  Musculoskeletal: Normal range of motion.  Neurological: She is alert and oriented to person, place, and time.  Skin: Skin is warm and dry.  Psychiatric: She has a normal mood and affect. Her behavior is normal.     Assessment/Plan The patient appears to have a large perirectal abscess. I would recommend that this area be incised and drained in the OR. I have discussed with her the risks and benefits of the surgery as well as some of the technical aspects and she understands and wishes  to proceed  Merrie Roof, MD 06/13/2016, 9:07 PM

## 2016-06-13 NOTE — Anesthesia Preprocedure Evaluation (Addendum)
Anesthesia Evaluation  Patient identified by MRN, date of birth, ID band Patient awake    Reviewed: Allergy & Precautions, H&P , NPO status , Patient's Chart, lab work & pertinent test results  Airway Mallampati: III  TM Distance: >3 FB Neck ROM: Full    Dental no notable dental hx. (+) Edentulous Upper, Partial Lower, Dental Advisory Given   Pulmonary Current Smoker,    Pulmonary exam normal breath sounds clear to auscultation       Cardiovascular hypertension, Pt. on medications and Pt. on home beta blockers  Rhythm:Regular Rate:Normal     Neuro/Psych negative neurological ROS  negative psych ROS   GI/Hepatic negative GI ROS, Neg liver ROS,   Endo/Other  Hypothyroidism   Renal/GU negative Renal ROS  negative genitourinary   Musculoskeletal  (+) Arthritis , Osteoarthritis,    Abdominal   Peds  Hematology negative hematology ROS (+)   Anesthesia Other Findings   Reproductive/Obstetrics negative OB ROS                            Anesthesia Physical Anesthesia Plan  ASA: II and emergent  Anesthesia Plan: General   Post-op Pain Management:    Induction: Intravenous  Airway Management Planned: LMA and Oral ETT  Additional Equipment:   Intra-op Plan:   Post-operative Plan: Extubation in OR  Informed Consent: I have reviewed the patients History and Physical, chart, labs and discussed the procedure including the risks, benefits and alternatives for the proposed anesthesia with the patient or authorized representative who has indicated his/her understanding and acceptance.   Dental advisory given  Plan Discussed with: CRNA  Anesthesia Plan Comments:        Anesthesia Quick Evaluation

## 2016-06-13 NOTE — Op Note (Signed)
06/13/2016  10:30 PM  PATIENT:  Heather Wilcox  76 y.o. female  PRE-OPERATIVE DIAGNOSIS:  perirectal abscess  POST-OPERATIVE DIAGNOSIS:  perirectal abscess  PROCEDURE:  Procedure(s): IRRIGATION AND DEBRIDEMENT PERIRECTAL ABSCESS (N/A)  SURGEON:  Surgeon(s) and Role:    * Jovita Kussmaul, MD - Primary  PHYSICIAN ASSISTANT:   ASSISTANTS: none   ANESTHESIA:   general  EBL:  Total I/O In: 500 [I.V.:500] Out: 10 [Blood:10]  BLOOD ADMINISTERED:none  DRAINS: none   LOCAL MEDICATIONS USED:  MARCAINE     SPECIMEN:  No Specimen  DISPOSITION OF SPECIMEN:  N/A  COUNTS:  YES  TOURNIQUET:  * No tourniquets in log *  DICTATION: .Dragon Dictation   After informed consent was obtained patient was brought to the operating room and placed in the supine position on the operating room table. After adequate induction of general anesthesia the patient was moved in the lithotomy position. The perirectal region was prepped with Betadine and draped in usual sterile manner. An appropriate timeout was performed. In the left perirectal region there was a large swollen fluctuant area. This area was infiltrated with quarter percent Marcaine. A vertically oriented incision was made in the left perirectal space with the Bovie electrocautery. An abscess cavity was readily entered. The pus was evacuated. Cultures were obtained. The cavity was then probed bluntly with the finger to break up all loculations. Once we were satisfied that the cavity was completely opened and drained then hemostasis was achieved using the Bovie electrocautery. The wound was packed with a moistened Kerlix gauze and sterile dressings were applied. The patient tolerated the procedure well. At the end of the case all needle sponge counts were correct. The patient was then awakened and taken to recovery in stable condition.  PLAN OF CARE: Admit to inpatient   PATIENT DISPOSITION:  PACU - hemodynamically stable.   Delay start of  Pharmacological VTE agent (>24hrs) due to surgical blood loss or risk of bleeding: not applicable

## 2016-06-13 NOTE — Anesthesia Procedure Notes (Signed)
Procedure Name: LMA Insertion Performed by: Shaleigh Laubscher J Pre-anesthesia Checklist: Patient identified, Emergency Drugs available, Suction available, Patient being monitored and Timeout performed Patient Re-evaluated:Patient Re-evaluated prior to inductionOxygen Delivery Method: Circle system utilized Preoxygenation: Pre-oxygenation with 100% oxygen Intubation Type: IV induction Ventilation: Mask ventilation without difficulty LMA: LMA inserted LMA Size: 4.0 Number of attempts: 2 Placement Confirmation: positive ETCO2,  CO2 detector and breath sounds checked- equal and bilateral Tube secured with: Tape Dental Injury: Teeth and Oropharynx as per pre-operative assessment      

## 2016-06-14 ENCOUNTER — Encounter (HOSPITAL_COMMUNITY): Payer: Self-pay | Admitting: General Surgery

## 2016-06-14 LAB — URINE CULTURE: CULTURE: NO GROWTH

## 2016-06-14 MED ORDER — PANTOPRAZOLE SODIUM 40 MG PO TBEC
40.0000 mg | DELAYED_RELEASE_TABLET | Freq: Every day | ORAL | Status: DC
  Administered 2016-06-14 – 2016-06-15 (×2): 40 mg via ORAL
  Filled 2016-06-14 (×2): qty 1

## 2016-06-14 MED ORDER — ENSURE ENLIVE PO LIQD
237.0000 mL | Freq: Two times a day (BID) | ORAL | Status: DC
  Administered 2016-06-14 – 2016-06-15 (×2): 237 mL via ORAL

## 2016-06-14 MED ORDER — MORPHINE SULFATE (PF) 4 MG/ML IV SOLN: 1.0000 mg | INTRAVENOUS | Status: DC | PRN

## 2016-06-14 NOTE — Progress Notes (Signed)
Pharmacy Antibiotic Note  Heather Wilcox is a 76 y.o. female admitted on 06/13/2016 with perirectal abscess.  Pharmacy has been consulted for zosyn dosing.  Plan: Zosyn 3.375g IV q8h (4 hour infusion).  Height: 5\' 7"  (170.2 cm) Weight: 181 lb (82.1 kg) IBW/kg (Calculated) : 61.6  Temp (24hrs), Avg:99.2 F (37.3 C), Min:98.2 F (36.8 C), Max:101 F (38.3 C)   Recent Labs Lab 06/13/16 1541 06/13/16 1557 06/13/16 1950  WBC 13.4*  --   --   CREATININE 0.98  --   --   LATICACIDVEN  --  2.34* 1.05    Estimated Creatinine Clearance: 55.5 mL/min (by C-G formula based on SCr of 0.98 mg/dL).    No Known Allergies  Antimicrobials this admission: Zosyn 2/15 >>  Dose adjustments this admission: -  Microbiology results: pending  Thank you for allowing pharmacy to be a part of this patient's care.  Heather Wilcox 06/14/2016 2:53 AM

## 2016-06-14 NOTE — Discharge Instructions (Signed)
[facility] _______[facility]  RECTAL SURGERY POST OP INSTRUCTIONS: POST OP INSTRUCTIONS  Always review your discharge instruction sheet given to you by the facility where your surgery was performed. IF YOU HAVE DISABILITY OR FAMILY LEAVE FORMS, YOU MUST BRING THEM TO THE OFFICE FOR PROCESSING.   DO NOT GIVE THEM TO YOUR DOCTOR.  1. A  prescription for pain medication may be given to you upon discharge.  Take your pain medication as prescribed, if needed.  If narcotic pain medicine is not needed, then you may take acetaminophen (Tylenol) or ibuprofen (Advil) as needed. 2. Take your usually prescribed medications unless otherwise directed. 3. If you need a refill on your pain medication, please contact your pharmacy.  They will contact our office to request authorization. Prescriptions will not be filled after 5 pm or on week-ends. 4. You should follow a light diet the first 48 hours after arrival home, such as soup and crackers, etc.  Be sure to include lots of fluids daily.  Resume your normal diet 2-3 days after surgery.. 5. Soak in warm water three times a day. 6. It is common to experience some constipation if taking pain medication after surgery.  Increasing fluid intake and taking a stool softener (such as Colace) will usually help or prevent this problem from occurring.  A mild laxative (Milk of Magnesia or Miralax) should be taken according to package directions if there are no bowel movements after 48 hours. 7. You will need to wear an absorbent pad or soft cotton gauze in your underwear until the drainage stops.it. 8. ACTIVITIES:  You may resume regular (light) daily activities beginning the next day--such as daily self-care, walking, climbing stairs--gradually increasing activities as tolerated.  You may have sexual intercourse when it is comfortable.  Refrain from any heavy lifting or straining for one week until approved by your doctor. a. You may drive when you are no longer  taking prescription pain medication, you can comfortably wear a seatbelt, and you can safely maneuver your car and apply brakes. b. RETURN TO WORK: : ____________________ c.  9. You should see Dr. Marlou Starks in the office for a follow-up appointment approximately 1-2 weeks after your surgery.  Make sure that you call for this appointment within a day or two after you arrive home to insure a convenient appointment time. 10. OTHER INSTRUCTIONS:  __________________________________________________________________________________________________________________________________________________________________________________________  WHEN TO CALL YOUR DOCTOR: 1. Fever over 101.0 2. Inability to urinate 3. Nausea and/or vomiting 4. Extreme swelling or bruising 5. Continued bleeding from rectum. 6. Increased pain, redness, or drainage from the incision 7. Constipation  The clinic staff is available to answer your questions during regular business hours.  Please dont hesitate to call and ask to speak to one of the nurses for clinical concerns.  If you have a medical emergency, go to the nearest emergency room or call 911.  A surgeon from [facility] is always on call at the hospital   [address], [place], [place], [place]  [address] ?  [address], [place], [place]   [address] [phone] ? [phone] ? FAX [phone] Web site: [email or web address]

## 2016-06-14 NOTE — Progress Notes (Signed)
Initial Nutrition Assessment  DOCUMENTATION CODES:   Not applicable  INTERVENTION:   Ensure Enlive po BID, each supplement provides 350 kcal and 20 grams of protein  NUTRITION DIAGNOSIS:   Increased nutrient needs related to wound healing as evidenced by increased estimated needs from protein.  GOAL:   Patient will meet greater than or equal to 90% of their needs  MONITOR:   PO intake, Supplement acceptance, Skin, I & O's, Labs  REASON FOR ASSESSMENT:   Malnutrition Screening Tool    ASSESSMENT:   76yo F w/ PMH incluidng HTN, hypothyroidism, recent influenza A who p/w weakness and rectal pain. Found to have left Perirectal abscess s/p I & D (Dr. Marlou Starks) 06/13/16   Pt currently eating 50% clear liquid diet. Advanced to regular diet today. Pt was drinking Ensure on last admit; RD will order. Per chart, pt is weight stable. Pt with recent flu and last admission  two weeks ago. Pt with hypokalemia today. Monitor and supplement per MD discretion.   Medications reviewed and include: fentanyl, heparin, synthroid, protonix, zosyn  Labs reviewed: K 3.1(L), Cl 99(L), tbili 1.4(H), lactic acid 2.34(H) Wbc- 13.4(H)  Nutrition-Focused physical exam completed. Findings are no fat depletion, mild muscle depletion in clavicular and calf regions, and no edema.   Diet Order:  Diet regular Room service appropriate? Yes; Fluid consistency: Thin  Skin:  Wound (see comment) (rectal incision ), perirectal abscess   Last BM:  2/15  Height:   Ht Readings from Last 1 Encounters:  06/13/16 5\' 7"  (1.702 m)    Weight:   Wt Readings from Last 1 Encounters:  06/13/16 181 lb (82.1 kg)    Ideal Body Weight:  61.3 kg  BMI:  Body mass index is 28.35 kg/m.  Estimated Nutritional Needs:   Kcal:  1600-1800kcal/day   Protein:  90-106g/day   Fluid:  >1.6L/day   EDUCATION NEEDS:   No education needs identified at this time  Koleen Distance, RD, LDN Pager #[phone]

## 2016-06-14 NOTE — Progress Notes (Signed)
Assessment Principal Problem:   Left Perirectal abscess s/p I & D (Dr. Marlou Starks) 06/13/16-very sore, but feel better than she did preop; wound is clean.   Plan:  Start sitz baths today.  Hopefully home tomorrow.   LOS: 1 day     1 Day Post-Op  Subjective: Sore but less than preop.  Lives alone.  Objective: Vital signs in last 24 hours: Temp:  [98.2 F (36.8 C)-101 F (38.3 C)] 99.1 F (37.3 C) (02/16 0410) Pulse Rate:  [85-111] 93 (02/16 0410) Resp:  [15-20] 17 (02/16 0410) BP: (137-188)/(79-105) 154/89 (02/16 0410) SpO2:  [90 %-100 %] 99 % (02/16 0410) Weight:  [82.1 kg (181 lb)] 82.1 kg (181 lb) (02/15 1450) Last BM Date: 06/13/16  Intake/Output from previous day: 02/15 0701 - 02/16 0700 In: 1171.7 [I.V.:1071.7; IV Piggyback:100] Out: [id number] [Urine:1351; Blood:10] Intake/Output this shift: No intake/output data recorded.  PE: General- In NAD Anorectal-packing removed from left perianal wound which is clean.  Lab Results:   Recent Labs  06/13/16 1541  WBC 13.4*  HGB 11.9*  HCT 36.3  PLT 408*   BMET  Recent Labs  06/13/16 1541  NA 139  K 3.1*  CL 99*  CO2 29  GLUCOSE 148*  BUN 18  CREATININE 0.98  CALCIUM 9.4   PT/INR No results for input(s): LABPROT, INR in the last 72 hours. Comprehensive Metabolic Panel:    Component Value Date/Time   NA 139 06/13/2016 1541   NA 139 06/02/2016 0309   K 3.1 (L) 06/13/2016 1541   K 3.7 06/02/2016 0309   CL 99 (L) 06/13/2016 1541   CL 107 06/02/2016 0309   CO2 29 06/13/2016 1541   CO2 25 06/02/2016 0309   BUN 18 06/13/2016 1541   BUN 13 06/02/2016 0309   CREATININE 0.98 06/13/2016 1541   CREATININE 0.72 06/02/2016 0309   GLUCOSE 148 (H) 06/13/2016 1541   GLUCOSE 105 (H) 06/02/2016 0309   CALCIUM 9.4 06/13/2016 1541   CALCIUM 9.0 06/02/2016 0309   AST 26 06/13/2016 1541   AST 143 (H) 05/31/2016 0532   ALT 20 06/13/2016 1541   ALT 57 (H) 05/31/2016 0532   ALKPHOS 57 06/13/2016 1541   ALKPHOS 40 05/31/2016  0532   BILITOT 1.4 (H) 06/13/2016 1541   BILITOT 0.7 05/31/2016 0532   PROT 9.5 (H) 06/13/2016 1541   PROT 7.0 05/31/2016 0532   ALBUMIN 3.7 06/13/2016 1541   ALBUMIN 3.0 (L) 05/31/2016 0532     Studies/Results: Ct Pelvis W Contrast  Result Date: 06/13/2016 CLINICAL DATA:  Rectal pain, diarrhea and decreased appetite for 4 days. EXAM: CT PELVIS WITH CONTRAST TECHNIQUE: Multidetector CT imaging of the pelvis was performed using the standard protocol following the bolus administration of intravenous contrast. CONTRAST:  1 ISOVUE-300 IOPAMIDOL (ISOVUE-300) INJECTION 61% COMPARISON:  None. FINDINGS: Urinary Tract: The ureters appear normal. No obstructing ureteral calculi or bladder calculi. No asymmetric bladder wall thickening or bladder mass. Bowel: The visualize colon and small bowel appear normal. Part of the appendix is visualized and appears normal. The terminal ileum is normal. No rectal mass or perirectal adenopathy. There is a large complex perianal/ perineal abscess. There appears to be the perianal fistula at the 5 o'clock position extending down into the complex abscess (5.8 x 4.6 cm). Findings most consistent with a grade 4 perianal fistula. Vascular/Lymphatic: The major vascular structures are patent. Distal aortic and iliac artery calcifications are noted. No pelvic lymphadenopathy. Small scattered inguinal lymph nodes are noted. No intra  pelvic abscess. Reproductive: The uterus and ovaries are grossly normal. The endometrium appears slightly thickened and irregular for age. Recommend correlation with pelvic ultrasound. Other:  No other significant findings. Musculoskeletal: The bony structures are intact. IMPRESSION: 1. CT findings most consistent with a grade 4 perianal fistula with a large complex perianal/perineal abscess. 2. No intrapelvic abscess. 3. Slightly thickened and irregular appearing endometrium. Recommend correlation with pelvic ultrasound. The ovaries appear normal.  Electronically Signed   By: Marijo Sanes M.D.   On: 06/13/2016 18:32    Anti-infectives: Anti-infectives    Start     Dose/Rate Route Frequency Ordered Stop   06/13/16 2345  piperacillin-tazobactam (ZOSYN) IVPB 3.375 g     3.375 g 12.5 mL/hr over 240 Minutes Intravenous Every 8 hours 06/13/16 2337     06/13/16 1530  piperacillin-tazobactam (ZOSYN) IVPB 3.375 g     3.375 g 12.5 mL/hr over 240 Minutes Intravenous  Once 06/13/16 1515 06/13/16 1645       Lakeya Mulka J 06/14/2016

## 2016-06-14 NOTE — Progress Notes (Signed)
Spoke with patient at bedside. States she lives at home alone, is active with PT and aide from [facility], Her daughter comes to check on her often and provides support. Patient states she has no issues managing her medications, has a PCP she sees frequently, uses a cane for ambulation. She would like to continue services with [facility], will ask for resumption orders. Contacted [facility] to follow for d/c needs.

## 2016-06-14 NOTE — Progress Notes (Signed)
Key Points: Use following P&T approved IV to PO non-antibiotic change policy.  Description contains the criteria that are approved Note: Policy Excludes:  Esophagectomy patientsPHARMACIST - PHYSICIAN COMMUNICATION DR:   Zella Richer CONCERNING: IV to Oral Route Change Policy  RECOMMENDATION: This patient is receiving protonix by the intravenous route.  Based on criteria approved by the Pharmacy and Therapeutics Committee, the intravenous medication(s) is/are being converted to the equivalent oral dose form(s).   DESCRIPTION: These criteria include:  The patient is eating (either orally or via tube) and/or has been taking other orally administered medications for a least 24 hours  The patient has no evidence of active gastrointestinal bleeding or impaired GI absorption (gastrectomy, short bowel, patient on TNA or NPO).  If you have questions about this conversion, please contact the Pharmacy Department  []   [phone] )  [facility] []   [phone] )  [facility] Pontes  []   [phone] )  [facility] [x]   [phone] )  [place], [place], [place] 06/14/2016 9:00 AM

## 2016-06-15 NOTE — Progress Notes (Signed)
Assessment Principal Problem:   Left Perirectal abscess s/p I & D (Dr. Marlou Starks) 06/13/16- Still sore.  Needing help getting to the bathroom.  No help today at home.  Plan wait until tomorrow for d/c.  May need PT consult if does not improve today.    Plan: Continue sitz baths.  Pain control.     LOS: 2 days     2 Days Post-Op  Subjective: Having a lot of pain still, especially with toileting.    Objective: Vital signs in last 24 hours: Temp:  [98.3 F (36.8 C)-98.8 F (37.1 C)] 98.4 F (36.9 C) (02/17 0436) Pulse Rate:  [62-82] 62 (02/17 0436) Resp:  [18] 18 (02/17 0436) BP: (108-114)/(60-82) 108/63 (02/17 0436) SpO2:  [96 %-100 %] 100 % (02/17 0436) Last BM Date: 06/13/16  Intake/Output from previous day: 02/16 0701 - 02/17 0700 In: 605 [P.O.:605] Out: 1 [Urine:1] Intake/Output this shift: No intake/output data recorded.  PE: General- In NAD Anorectal- superficial skin is a poor quality over the previous abscess.  Very tender still, but no active drainage.    Lab Results:   Recent Labs  06/13/16 1541  WBC 13.4*  HGB 11.9*  HCT 36.3  PLT 408*   BMET  Recent Labs  06/13/16 1541  NA 139  K 3.1*  CL 99*  CO2 29  GLUCOSE 148*  BUN 18  CREATININE 0.98  CALCIUM 9.4   PT/INR No results for input(s): LABPROT, INR in the last 72 hours. Comprehensive Metabolic Panel:    Component Value Date/Time   NA 139 06/13/2016 1541   NA 139 06/02/2016 0309   K 3.1 (L) 06/13/2016 1541   K 3.7 06/02/2016 0309   CL 99 (L) 06/13/2016 1541   CL 107 06/02/2016 0309   CO2 29 06/13/2016 1541   CO2 25 06/02/2016 0309   BUN 18 06/13/2016 1541   BUN 13 06/02/2016 0309   CREATININE 0.98 06/13/2016 1541   CREATININE 0.72 06/02/2016 0309   GLUCOSE 148 (H) 06/13/2016 1541   GLUCOSE 105 (H) 06/02/2016 0309   CALCIUM 9.4 06/13/2016 1541   CALCIUM 9.0 06/02/2016 0309   AST 26 06/13/2016 1541   AST 143 (H) 05/31/2016 0532   ALT 20 06/13/2016 1541   ALT 57 (H) 05/31/2016 0532    ALKPHOS 57 06/13/2016 1541   ALKPHOS 40 05/31/2016 0532   BILITOT 1.4 (H) 06/13/2016 1541   BILITOT 0.7 05/31/2016 0532   PROT 9.5 (H) 06/13/2016 1541   PROT 7.0 05/31/2016 0532   ALBUMIN 3.7 06/13/2016 1541   ALBUMIN 3.0 (L) 05/31/2016 0532     Studies/Results: Ct Pelvis W Contrast  Result Date: 06/13/2016 CLINICAL DATA:  Rectal pain, diarrhea and decreased appetite for 4 days. EXAM: CT PELVIS WITH CONTRAST TECHNIQUE: Multidetector CT imaging of the pelvis was performed using the standard protocol following the bolus administration of intravenous contrast. CONTRAST:  1 ISOVUE-300 IOPAMIDOL (ISOVUE-300) INJECTION 61% COMPARISON:  None. FINDINGS: Urinary Tract: The ureters appear normal. No obstructing ureteral calculi or bladder calculi. No asymmetric bladder wall thickening or bladder mass. Bowel: The visualize colon and small bowel appear normal. Part of the appendix is visualized and appears normal. The terminal ileum is normal. No rectal mass or perirectal adenopathy. There is a large complex perianal/ perineal abscess. There appears to be the perianal fistula at the 5 o'clock position extending down into the complex abscess (5.8 x 4.6 cm). Findings most consistent with a grade 4 perianal fistula. Vascular/Lymphatic: The major vascular structures are  patent. Distal aortic and iliac artery calcifications are noted. No pelvic lymphadenopathy. Small scattered inguinal lymph nodes are noted. No intra pelvic abscess. Reproductive: The uterus and ovaries are grossly normal. The endometrium appears slightly thickened and irregular for age. Recommend correlation with pelvic ultrasound. Other:  No other significant findings. Musculoskeletal: The bony structures are intact. IMPRESSION: 1. CT findings most consistent with a grade 4 perianal fistula with a large complex perianal/perineal abscess. 2. No intrapelvic abscess. 3. Slightly thickened and irregular appearing endometrium. Recommend correlation with  pelvic ultrasound. The ovaries appear normal. Electronically Signed   By: Marijo Sanes M.D.   On: 06/13/2016 18:32    Anti-infectives: Anti-infectives    Start     Dose/Rate Route Frequency Ordered Stop   06/13/16 2345  piperacillin-tazobactam (ZOSYN) IVPB 3.375 g     3.375 g 12.5 mL/hr over 240 Minutes Intravenous Every 8 hours 06/13/16 2337     06/13/16 1530  piperacillin-tazobactam (ZOSYN) IVPB 3.375 g     3.375 g 12.5 mL/hr over 240 Minutes Intravenous  Once 06/13/16 1515 06/13/16 1645       Harinder Romas 06/15/2016

## 2016-06-16 MED ORDER — HYDROCODONE-ACETAMINOPHEN 5-325 MG PO TABS: 1.0000 | ORAL_TABLET | ORAL | 0 refills | Status: DC | PRN

## 2016-06-16 NOTE — Progress Notes (Signed)
Pt leaving this afternoon with her "niece". Alert, oriented, and without c/o. Discharge instructions/prescriptions given/explained with pt verbalizing understanding.  Pt aware of incision care and followup appointment.

## 2016-06-16 NOTE — Progress Notes (Signed)
Assessment Principal Problem:   Left Perirectal abscess s/p I & D (Dr. Marlou Starks) 06/13/16-Doing better.  Plan: Continue sitz baths.  Discharge today.    LOS: 3 days     3 Days Post-Op  Subjective: More comfortable today.    Objective: Vital signs in last 24 hours: Temp:  [98.4 F (36.9 C)-99.2 F (37.3 C)] 98.4 F (36.9 C) (02/18 0410) Pulse Rate:  [60-71] 60 (02/18 0410) Resp:  [17-18] 17 (02/18 0410) BP: (119-132)/(68-77) 126/69 (02/18 0410) SpO2:  [96 %-100 %] 100 % (02/18 0410) Last BM Date: 06/15/16  Intake/Output from previous day: 02/17 0701 - 02/18 0700 In: 83 [P.O.:880; IV Piggyback:100] Out: 601 [Urine:600; Stool:1] Intake/Output this shift: Total I/O In: 210 [P.O.:210] Out: -   PE: General- In NAD Anorectal- superficial skin is a poor quality over the previous abscess.  Much less tender today.    Lab Results:   Recent Labs  06/13/16 1541  WBC 13.4*  HGB 11.9*  HCT 36.3  PLT 408*   BMET  Recent Labs  06/13/16 1541  NA 139  K 3.1*  CL 99*  CO2 29  GLUCOSE 148*  BUN 18  CREATININE 0.98  CALCIUM 9.4   PT/INR No results for input(s): LABPROT, INR in the last 72 hours. Comprehensive Metabolic Panel:    Component Value Date/Time   NA 139 06/13/2016 1541   NA 139 06/02/2016 0309   K 3.1 (L) 06/13/2016 1541   K 3.7 06/02/2016 0309   CL 99 (L) 06/13/2016 1541   CL 107 06/02/2016 0309   CO2 29 06/13/2016 1541   CO2 25 06/02/2016 0309   BUN 18 06/13/2016 1541   BUN 13 06/02/2016 0309   CREATININE 0.98 06/13/2016 1541   CREATININE 0.72 06/02/2016 0309   GLUCOSE 148 (H) 06/13/2016 1541   GLUCOSE 105 (H) 06/02/2016 0309   CALCIUM 9.4 06/13/2016 1541   CALCIUM 9.0 06/02/2016 0309   AST 26 06/13/2016 1541   AST 143 (H) 05/31/2016 0532   ALT 20 06/13/2016 1541   ALT 57 (H) 05/31/2016 0532   ALKPHOS 57 06/13/2016 1541   ALKPHOS 40 05/31/2016 0532   BILITOT 1.4 (H) 06/13/2016 1541   BILITOT 0.7 05/31/2016 0532   PROT 9.5 (H) 06/13/2016  1541   PROT 7.0 05/31/2016 0532   ALBUMIN 3.7 06/13/2016 1541   ALBUMIN 3.0 (L) 05/31/2016 0532     Studies/Results: No results found.  Anti-infectives: Anti-infectives    Start     Dose/Rate Route Frequency Ordered Stop   06/13/16 2345  piperacillin-tazobactam (ZOSYN) IVPB 3.375 g     3.375 g 12.5 mL/hr over 240 Minutes Intravenous Every 8 hours 06/13/16 2337     06/13/16 1530  piperacillin-tazobactam (ZOSYN) IVPB 3.375 g     3.375 g 12.5 mL/hr over 240 Minutes Intravenous  Once 06/13/16 1515 06/13/16 1645       Heather Wilcox 06/16/2016 Patient ID: Heather Wilcox, female   DOB: [birth year], 76 y.o.   MRN: [id number]

## 2016-06-16 NOTE — Progress Notes (Signed)
Sitz bath given. Wound care performed after sitz bath. Pt tolerated well. Will continue to monitor

## 2016-06-18 LAB — CULTURE, BLOOD (ROUTINE X 2)
CULTURE: NO GROWTH
CULTURE: NO GROWTH

## 2016-06-19 DIAGNOSIS — E039 Hypothyroidism, unspecified: Secondary | ICD-10-CM | POA: Diagnosis not present

## 2016-06-19 DIAGNOSIS — Z86011 Personal history of benign neoplasm of the brain: Secondary | ICD-10-CM | POA: Diagnosis not present

## 2016-06-19 DIAGNOSIS — E876 Hypokalemia: Secondary | ICD-10-CM | POA: Diagnosis not present

## 2016-06-19 DIAGNOSIS — Z8719 Personal history of other diseases of the digestive system: Secondary | ICD-10-CM | POA: Diagnosis not present

## 2016-06-19 DIAGNOSIS — I10 Essential (primary) hypertension: Secondary | ICD-10-CM | POA: Diagnosis not present

## 2016-06-19 LAB — AEROBIC/ANAEROBIC CULTURE (SURGICAL/DEEP WOUND)

## 2016-06-19 LAB — AEROBIC/ANAEROBIC CULTURE W GRAM STAIN (SURGICAL/DEEP WOUND)

## 2016-06-20 DIAGNOSIS — H3540 Unspecified peripheral retinal degeneration: Secondary | ICD-10-CM | POA: Diagnosis not present

## 2016-06-21 DIAGNOSIS — E039 Hypothyroidism, unspecified: Secondary | ICD-10-CM | POA: Diagnosis not present

## 2016-06-21 DIAGNOSIS — E119 Type 2 diabetes mellitus without complications: Secondary | ICD-10-CM | POA: Diagnosis not present

## 2016-06-21 DIAGNOSIS — G4709 Other insomnia: Secondary | ICD-10-CM | POA: Diagnosis not present

## 2016-06-21 DIAGNOSIS — S31000S Unspecified open wound of lower back and pelvis without penetration into retroperitoneum, sequela: Secondary | ICD-10-CM | POA: Diagnosis not present

## 2016-06-21 DIAGNOSIS — Z8719 Personal history of other diseases of the digestive system: Secondary | ICD-10-CM | POA: Diagnosis not present

## 2016-06-21 DIAGNOSIS — I10 Essential (primary) hypertension: Secondary | ICD-10-CM | POA: Diagnosis not present

## 2016-06-21 DIAGNOSIS — E876 Hypokalemia: Secondary | ICD-10-CM | POA: Diagnosis not present

## 2016-06-21 DIAGNOSIS — Z86011 Personal history of benign neoplasm of the brain: Secondary | ICD-10-CM | POA: Diagnosis not present

## 2016-06-25 DIAGNOSIS — E039 Hypothyroidism, unspecified: Secondary | ICD-10-CM | POA: Diagnosis not present

## 2016-06-25 DIAGNOSIS — I10 Essential (primary) hypertension: Secondary | ICD-10-CM | POA: Diagnosis not present

## 2016-06-25 DIAGNOSIS — Z86011 Personal history of benign neoplasm of the brain: Secondary | ICD-10-CM | POA: Diagnosis not present

## 2016-06-25 DIAGNOSIS — E876 Hypokalemia: Secondary | ICD-10-CM | POA: Diagnosis not present

## 2016-06-25 DIAGNOSIS — Z8719 Personal history of other diseases of the digestive system: Secondary | ICD-10-CM | POA: Diagnosis not present

## 2016-06-27 DIAGNOSIS — Z86011 Personal history of benign neoplasm of the brain: Secondary | ICD-10-CM | POA: Diagnosis not present

## 2016-06-27 DIAGNOSIS — I10 Essential (primary) hypertension: Secondary | ICD-10-CM | POA: Diagnosis not present

## 2016-06-27 DIAGNOSIS — E876 Hypokalemia: Secondary | ICD-10-CM | POA: Diagnosis not present

## 2016-06-27 DIAGNOSIS — E039 Hypothyroidism, unspecified: Secondary | ICD-10-CM | POA: Diagnosis not present

## 2016-06-27 DIAGNOSIS — Z8719 Personal history of other diseases of the digestive system: Secondary | ICD-10-CM | POA: Diagnosis not present

## 2016-06-27 NOTE — Discharge Summary (Signed)
Physician Discharge Summary  Patient ID: Heather Wilcox MRN: [id number] DOB/AGE: 07-31-1940 76 y.o.  Admit date: 06/13/2016 Discharge date: 02/162018  Admission Diagnoses:  Left perirectal abscess  Discharge Diagnoses:  Left perirectal abscess Principal Problem:   Left Perirectal abscess s/p I & D (Dr. Marlou Starks) 06/13/16   PROCEDURES: IRRIGATION AND DEBRIDEMENT PERIRECTAL ABSCESS (N/A) 06/13/16, Dr. Autumn Messing.  Hospital Course:  The patient presents with left perirectal pain that has been going on for about a week. Denies fever or chills. CT shows a large perirectal abscess. She was seen in the office by Dr. Marlou Starks admitted directly. She was taken the operating room later that evening. She was started on sitz baths mobilize the first postoperative morning of discharge later that day. She will follow-up with Dr. Marlou Starks in about 2-3 weeks.  Condition ON discharge: Improved.   Disposition: 01-Home or Self Care  Discharge Instructions    Call MD for:  persistant nausea and vomiting    Complete by:  As directed    Call MD for:  redness, tenderness, or signs of infection (pain, swelling, redness, odor or green/yellow discharge around incision site)    Complete by:  As directed    Call MD for:  severe uncontrolled pain    Complete by:  As directed    Call MD for:  temperature >100.4    Complete by:  As directed    Change dressing (specify)    Complete by:  As directed    Sitz baths BID and PRN BM/dirty dressing.  Wet to dry to left buttock wound   Diet - low sodium heart healthy    Complete by:  As directed    Increase activity slowly    Complete by:  As directed      Allergies as of 06/16/2016   No Known Allergies     Medication List    TAKE these medications   cloNIDine 0.1 MG tablet Commonly known as:  CATAPRES Take 0.1 mg by mouth at bedtime.   diphenhydramine-acetaminophen 25-500 MG Tabs tablet Commonly known as:  TYLENOL PM Take 1 tablet by mouth daily as needed  (for pain).   folic acid A999333 MCG tablet Commonly known as:  FOLVITE Take 400 mcg by mouth daily.   guaifenesin 100 MG/5ML syrup Commonly known as:  ROBITUSSIN Take 200 mg by mouth 3 (three) times daily as needed for cough.   HYDROcodone-acetaminophen 5-325 MG tablet Commonly known as:  NORCO/VICODIN Take 1-2 tablets by mouth every 4 (four) hours as needed for moderate pain.   levofloxacin 500 MG tablet Commonly known as:  LEVAQUIN Take 1 tablet (500 mg total) by mouth daily. X 3 days   levothyroxine 75 MCG tablet Commonly known as:  SYNTHROID, LEVOTHROID Take 75 mcg by mouth daily before breakfast.   lisinopril-hydrochlorothiazide 20-25 MG tablet Commonly known as:  PRINZIDE,ZESTORETIC Take 2 tablets by mouth daily. HOLD until PCP follow-up What changed:  additional instructions   metoprolol succinate 100 MG 24 hr tablet Commonly known as:  TOPROL-XL Take 100 mg by mouth 2 (two) times daily. Take with or immediately following a meal.   oseltamivir 75 MG capsule Commonly known as:  TAMIFLU Take 1 capsule (75 mg total) by mouth 2 (two) times daily. X 3 more days   temazepam 15 MG capsule Commonly known as:  RESTORIL Take 15 mg by mouth at bedtime as needed for sleep.      Follow-up Information    TOTH Joneen Boers, MD Follow up.  Specialty:  General Surgery Why:  Call Monday for an appointment in 2 weeks for follow up with Dr. Marlou Starks. Contact information: [place] [address] [phone]        Benito Mccreedy, MD Follow up.   Specialty:  Internal Medicine Why:  Call and follow up for Medical issues. Contact information: [address] [place] [address] [phone]        [facility] Follow up.   Why:  physical therapy and aide Contact information: [address] [place] [address] [phone]           Signed: Earnstine Regal 06/27/2016, 7:07 PM

## 2016-07-02 DIAGNOSIS — Z86011 Personal history of benign neoplasm of the brain: Secondary | ICD-10-CM | POA: Diagnosis not present

## 2016-07-02 DIAGNOSIS — E876 Hypokalemia: Secondary | ICD-10-CM | POA: Diagnosis not present

## 2016-07-02 DIAGNOSIS — E039 Hypothyroidism, unspecified: Secondary | ICD-10-CM | POA: Diagnosis not present

## 2016-07-02 DIAGNOSIS — I10 Essential (primary) hypertension: Secondary | ICD-10-CM | POA: Diagnosis not present

## 2016-07-02 DIAGNOSIS — Z8719 Personal history of other diseases of the digestive system: Secondary | ICD-10-CM | POA: Diagnosis not present

## 2016-07-11 DIAGNOSIS — Z8719 Personal history of other diseases of the digestive system: Secondary | ICD-10-CM | POA: Diagnosis not present

## 2016-07-11 DIAGNOSIS — Z86011 Personal history of benign neoplasm of the brain: Secondary | ICD-10-CM | POA: Diagnosis not present

## 2016-07-11 DIAGNOSIS — E039 Hypothyroidism, unspecified: Secondary | ICD-10-CM | POA: Diagnosis not present

## 2016-07-11 DIAGNOSIS — E876 Hypokalemia: Secondary | ICD-10-CM | POA: Diagnosis not present

## 2016-07-11 DIAGNOSIS — I10 Essential (primary) hypertension: Secondary | ICD-10-CM | POA: Diagnosis not present

## 2016-07-19 DIAGNOSIS — E119 Type 2 diabetes mellitus without complications: Secondary | ICD-10-CM | POA: Diagnosis not present

## 2016-07-19 DIAGNOSIS — S31000S Unspecified open wound of lower back and pelvis without penetration into retroperitoneum, sequela: Secondary | ICD-10-CM | POA: Diagnosis not present

## 2016-07-19 DIAGNOSIS — Z0000 Encounter for general adult medical examination without abnormal findings: Secondary | ICD-10-CM | POA: Diagnosis not present

## 2016-07-19 DIAGNOSIS — E039 Hypothyroidism, unspecified: Secondary | ICD-10-CM | POA: Diagnosis not present

## 2016-07-19 DIAGNOSIS — Z5181 Encounter for therapeutic drug level monitoring: Secondary | ICD-10-CM | POA: Diagnosis not present

## 2016-07-19 DIAGNOSIS — M1712 Unilateral primary osteoarthritis, left knee: Secondary | ICD-10-CM | POA: Diagnosis not present

## 2016-07-19 DIAGNOSIS — G4709 Other insomnia: Secondary | ICD-10-CM | POA: Diagnosis not present

## 2016-07-19 DIAGNOSIS — I10 Essential (primary) hypertension: Secondary | ICD-10-CM | POA: Diagnosis not present

## 2016-08-07 DIAGNOSIS — E039 Hypothyroidism, unspecified: Secondary | ICD-10-CM | POA: Diagnosis not present

## 2016-08-07 DIAGNOSIS — E119 Type 2 diabetes mellitus without complications: Secondary | ICD-10-CM | POA: Diagnosis not present

## 2016-08-07 DIAGNOSIS — I10 Essential (primary) hypertension: Secondary | ICD-10-CM | POA: Diagnosis not present

## 2016-08-07 DIAGNOSIS — M1712 Unilateral primary osteoarthritis, left knee: Secondary | ICD-10-CM | POA: Diagnosis not present

## 2016-08-07 DIAGNOSIS — G4709 Other insomnia: Secondary | ICD-10-CM | POA: Diagnosis not present

## 2016-08-08 DIAGNOSIS — D122 Benign neoplasm of ascending colon: Secondary | ICD-10-CM | POA: Diagnosis not present

## 2016-08-08 DIAGNOSIS — K635 Polyp of colon: Secondary | ICD-10-CM | POA: Diagnosis not present

## 2016-08-08 DIAGNOSIS — D126 Benign neoplasm of colon, unspecified: Secondary | ICD-10-CM | POA: Diagnosis not present

## 2016-08-08 DIAGNOSIS — K921 Melena: Secondary | ICD-10-CM | POA: Diagnosis not present

## 2016-08-13 DIAGNOSIS — D126 Benign neoplasm of colon, unspecified: Secondary | ICD-10-CM | POA: Diagnosis not present

## 2016-08-13 DIAGNOSIS — D122 Benign neoplasm of ascending colon: Secondary | ICD-10-CM | POA: Diagnosis not present

## 2016-08-13 DIAGNOSIS — K635 Polyp of colon: Secondary | ICD-10-CM | POA: Diagnosis not present

## 2016-09-04 DIAGNOSIS — C801 Malignant (primary) neoplasm, unspecified: Secondary | ICD-10-CM | POA: Diagnosis not present

## 2016-09-04 DIAGNOSIS — K635 Polyp of colon: Secondary | ICD-10-CM | POA: Diagnosis not present

## 2016-09-04 DIAGNOSIS — E119 Type 2 diabetes mellitus without complications: Secondary | ICD-10-CM | POA: Diagnosis not present

## 2016-09-04 DIAGNOSIS — I10 Essential (primary) hypertension: Secondary | ICD-10-CM | POA: Diagnosis not present

## 2016-09-04 DIAGNOSIS — E039 Hypothyroidism, unspecified: Secondary | ICD-10-CM | POA: Diagnosis not present

## 2016-09-04 DIAGNOSIS — L723 Sebaceous cyst: Secondary | ICD-10-CM | POA: Diagnosis not present

## 2016-09-09 ENCOUNTER — Ambulatory Visit: Payer: Self-pay | Admitting: General Surgery

## 2016-09-09 DIAGNOSIS — D123 Benign neoplasm of transverse colon: Secondary | ICD-10-CM | POA: Diagnosis not present

## 2016-09-10 ENCOUNTER — Other Ambulatory Visit: Payer: Self-pay | Admitting: General Surgery

## 2016-09-10 DIAGNOSIS — D123 Benign neoplasm of transverse colon: Secondary | ICD-10-CM

## 2016-09-13 ENCOUNTER — Ambulatory Visit
Admission: RE | Admit: 2016-09-13 | Discharge: 2016-09-13 | Disposition: A | Discharge status: Home / Self Care | Payer: Medicare Other | Source: Ambulatory Visit | Attending: General Surgery | Admitting: General Surgery

## 2016-09-13 DIAGNOSIS — C171 Malignant neoplasm of jejunum: Secondary | ICD-10-CM | POA: Diagnosis not present

## 2016-09-13 DIAGNOSIS — D123 Benign neoplasm of transverse colon: Secondary | ICD-10-CM

## 2016-09-13 MED ORDER — IOPAMIDOL (ISOVUE-300) INJECTION 61%
100.0000 mL | Freq: Once | INTRAVENOUS | Status: AC | PRN
  Administered 2016-09-13: 100 mL via INTRAVENOUS

## 2016-09-25 ENCOUNTER — Ambulatory Visit (INDEPENDENT_AMBULATORY_CARE_PROVIDER_SITE_OTHER): Payer: Medicare Other | Admitting: Physician Assistant

## 2016-09-25 ENCOUNTER — Encounter: Payer: Self-pay | Admitting: Physician Assistant

## 2016-09-25 VITALS — BP 159/89 | HR 78 | Temp 98.2°F | Resp 17 | Ht 67.5 in | Wt 178.0 lb

## 2016-09-25 DIAGNOSIS — H9193 Unspecified hearing loss, bilateral: Secondary | ICD-10-CM

## 2016-09-25 DIAGNOSIS — H6123 Impacted cerumen, bilateral: Secondary | ICD-10-CM | POA: Diagnosis not present

## 2016-09-25 NOTE — Patient Instructions (Addendum)
Please do not use Q-tips, as this will further impact the ear wax in your ear.   If you have cerumen impaction more than once a year you can reduce the occurrences by using a cotton ball dipped in mineral oil and place it in the external canal for 10-20 minutes once a week (combined with eight hours of not using a hearing aid overnight, if applicable). This helps liquify cerumen and aid in the normal elimination mechanisms.    Routine cleaning of ears by a health professional every 6-12 months is recommended.   If you have itchy ears, use sweet oil. If you need more relief use a small amount of hydrocortisone ointment.   Thank you for coming in today. I hope you feel we met your needs.  Feel free to call [facility] if you have any questions or further requests.  Please consider signing up for MyChart if you do not already have it, as this is a great way to communicate with me.  Best,  Whitney McVey, PA-C    IF you received an x-ray today, you will receive an invoice from [facility]. Please contact [facility] at [phone] with questions or concerns regarding your invoice.   IF you received labwork today, you will receive an invoice from [facility]. Please contact [facility] at [phone] with questions or concerns regarding your invoice.   Our billing staff will not be able to assist you with questions regarding bills from these companies.  You will be contacted with the lab results as soon as they are available. The fastest way to get your results is to activate your My Chart account. Instructions are located on the last page of this paperwork. If you have not heard from [place] regarding the results in 2 weeks, please contact this office.

## 2016-09-25 NOTE — Progress Notes (Signed)
Heather Wilcox  MRN: [id number] DOB: 1940/06/25  PCP: Benito Mccreedy, MD  Subjective:  Pt is a 76 year old female who presents to clinic for ear feeling stopped up. She is here today with her granddaughter. She endorses decreased hearing x 2 weeks. She has to turn television up "full blast" to hear it. Left ear is worse than right.  She got a Rx for acetic acid ear drops from her PCP and used them a few weeks ago. Her hearing has since decreased.  This is the first time this has happened to her.  Denies fever, chills, ear pain, ear drainage.   Review of Systems  Constitutional: Negative for chills, diaphoresis and fever.  HENT: Positive for hearing loss. Negative for ear discharge, ear pain, tinnitus, trouble swallowing and voice change.   Neurological: Positive for facial asymmetry (chronic). Negative for dizziness, syncope, speech difficulty and headaches.    Patient Active Problem List   Diagnosis Date Noted  . Left Perirectal abscess s/p I & D (Dr. Marlou Starks) 06/13/16 06/13/2016  . Influenza-like illness 05/31/2016  . Influenza A 05/30/2016  . Hypokalemia 05/30/2016  . Leukopenia 05/30/2016  . Thrombocytopenia ([place]) 05/30/2016  . Hypertensive urgency 05/30/2016  . Transaminasemia 05/30/2016  . MENINGIOMA 10/06/2006  . Hypothyroidism 10/06/2006  . DM 10/06/2006  . GLAUCOMA, PRIMARY OPEN-ANGLE 10/06/2006  . Hypertension 10/06/2006  . DEGENERATIVE JOINT DISEASE, LEFT KNEE 10/06/2006  . THYROIDECTOMY, HX OF 10/06/2006    Current Outpatient Prescriptions on File Prior to Visit  Medication Sig Dispense Refill  . cloNIDine (CATAPRES) 0.1 MG tablet Take 0.1 mg by mouth at bedtime.    . diphenhydramine-acetaminophen (TYLENOL PM) 25-500 MG TABS tablet Take 1 tablet by mouth daily as needed (for pain).    . folic acid (FOLVITE) 970 MCG tablet Take 400 mcg by mouth daily.    Marland Kitchen guaifenesin (ROBITUSSIN) 100 MG/5ML syrup Take 200 mg by mouth 3 (three) times daily as needed for cough.     Marland Kitchen HYDROcodone-acetaminophen (NORCO/VICODIN) 5-325 MG tablet Take 1-2 tablets by mouth every 4 (four) hours as needed for moderate pain. 20 tablet 0  . levofloxacin (LEVAQUIN) 500 MG tablet Take 1 tablet (500 mg total) by mouth daily. X 3 days 3 tablet 0  . levothyroxine (SYNTHROID, LEVOTHROID) 75 MCG tablet Take 75 mcg by mouth daily before breakfast.    . lisinopril-hydrochlorothiazide (PRINZIDE,ZESTORETIC) 20-25 MG tablet Take 2 tablets by mouth daily. HOLD until PCP follow-up (Patient taking differently: Take 2 tablets by mouth daily. )    . metoprolol succinate (TOPROL-XL) 100 MG 24 hr tablet Take 100 mg by mouth 2 (two) times daily. Take with or immediately following a meal.    . oseltamivir (TAMIFLU) 75 MG capsule Take 1 capsule (75 mg total) by mouth 2 (two) times daily. X 3 more days 6 capsule 0  . temazepam (RESTORIL) 15 MG capsule Take 15 mg by mouth at bedtime as needed for sleep.      No current facility-administered medications on file prior to visit.     No Known Allergies   Objective:  BP (!) 159/89   Pulse 78   Temp 98.2 F (36.8 C) (Oral)   Resp 17   Ht 5' 7.5" (1.715 m)   Wt 178 lb (80.7 kg)   SpO2 98%   BMI 27.47 kg/m   Physical Exam  Constitutional: She is oriented to person, place, and time and well-developed, well-nourished, and in no distress. No distress.  HENT:  B/l cerumen  impaction  Cardiovascular: Normal rate, regular rhythm and normal heart sounds.   Neurological: She is alert and oriented to person, place, and time. GCS score is 15.  Skin: Skin is warm and dry.  Psychiatric: Mood, memory, affect and judgment normal.  Vitals reviewed.   Assessment and Plan :  1. Bilateral impacted cerumen 2. Decreased hearing of both ears - Ear wax removal - Cerumen disimpaction went well. She reports major improvement of hearing. Cerumen impaction materials printed out and discussed with pt. Discouraged Q-tip use. RTC for routine cleaning if needed. She agrees  with plan.   Mercer Pod, PA-C  [facility] [place] 09/25/2016 4:13 PM

## 2016-10-11 DIAGNOSIS — K635 Polyp of colon: Secondary | ICD-10-CM | POA: Diagnosis not present

## 2016-10-11 DIAGNOSIS — M25562 Pain in left knee: Secondary | ICD-10-CM | POA: Diagnosis not present

## 2016-10-11 DIAGNOSIS — L723 Sebaceous cyst: Secondary | ICD-10-CM | POA: Diagnosis not present

## 2016-10-11 DIAGNOSIS — E119 Type 2 diabetes mellitus without complications: Secondary | ICD-10-CM | POA: Diagnosis not present

## 2016-10-11 DIAGNOSIS — I10 Essential (primary) hypertension: Secondary | ICD-10-CM | POA: Diagnosis not present

## 2016-10-11 DIAGNOSIS — C801 Malignant (primary) neoplasm, unspecified: Secondary | ICD-10-CM | POA: Diagnosis not present

## 2016-10-17 DIAGNOSIS — Z0000 Encounter for general adult medical examination without abnormal findings: Secondary | ICD-10-CM | POA: Diagnosis not present

## 2016-10-17 DIAGNOSIS — E7211 Homocystinuria: Secondary | ICD-10-CM | POA: Diagnosis not present

## 2016-10-17 DIAGNOSIS — Z01118 Encounter for examination of ears and hearing with other abnormal findings: Secondary | ICD-10-CM | POA: Diagnosis not present

## 2016-10-17 DIAGNOSIS — I10 Essential (primary) hypertension: Secondary | ICD-10-CM | POA: Diagnosis not present

## 2016-10-17 DIAGNOSIS — E039 Hypothyroidism, unspecified: Secondary | ICD-10-CM | POA: Diagnosis not present

## 2016-10-17 DIAGNOSIS — E119 Type 2 diabetes mellitus without complications: Secondary | ICD-10-CM | POA: Diagnosis not present

## 2016-10-21 ENCOUNTER — Inpatient Hospital Stay (HOSPITAL_COMMUNITY): Admission: RE | Admit: 2016-10-21 | Payer: Medicare Other | Source: Ambulatory Visit

## 2016-10-22 ENCOUNTER — Encounter (HOSPITAL_COMMUNITY)
Admission: RE | Admit: 2016-10-22 | Discharge: 2016-10-22 | Disposition: A | Discharge status: Home / Self Care | Payer: Medicare Other | Source: Ambulatory Visit | Attending: General Surgery | Admitting: General Surgery

## 2016-10-22 ENCOUNTER — Encounter (HOSPITAL_COMMUNITY): Payer: Self-pay

## 2016-10-22 DIAGNOSIS — Z01812 Encounter for preprocedural laboratory examination: Secondary | ICD-10-CM

## 2016-10-22 DIAGNOSIS — D374 Neoplasm of uncertain behavior of colon: Secondary | ICD-10-CM | POA: Diagnosis not present

## 2016-10-22 DIAGNOSIS — K6389 Other specified diseases of intestine: Secondary | ICD-10-CM

## 2016-10-22 DIAGNOSIS — K469 Unspecified abdominal hernia without obstruction or gangrene: Secondary | ICD-10-CM | POA: Diagnosis not present

## 2016-10-22 DIAGNOSIS — I10 Essential (primary) hypertension: Secondary | ICD-10-CM | POA: Diagnosis not present

## 2016-10-22 LAB — BASIC METABOLIC PANEL
ANION GAP: 6 (ref 5–15)
BUN: 18 mg/dL (ref 6–20)
CALCIUM: 8.9 mg/dL (ref 8.9–10.3)
CO2: 30 mmol/L (ref 22–32)
Chloride: 99 mmol/L — ABNORMAL LOW (ref 101–111)
Creatinine, Ser: 1.05 mg/dL — ABNORMAL HIGH (ref 0.44–1.00)
GFR calc Af Amer: 59 mL/min — ABNORMAL LOW (ref >60)
GFR, EST NON AFRICAN AMERICAN: 51 mL/min — AB (ref >60)
GLUCOSE: 134 mg/dL — AB (ref 65–99)
POTASSIUM: 3.3 mmol/L — AB (ref 3.5–5.1)
SODIUM: 135 mmol/L (ref 135–145)

## 2016-10-22 LAB — CBC
HEMATOCRIT: 37.2 % (ref 36.0–46.0)
Hemoglobin: 11.8 g/dL — ABNORMAL LOW (ref 12.0–15.0)
MCH: 28.6 pg (ref 26.0–34.0)
MCHC: 31.7 g/dL (ref 30.0–36.0)
MCV: 90.1 fL (ref 78.0–100.0)
PLATELETS: 190 10*3/uL (ref 150–400)
RBC: 4.13 MIL/uL (ref 3.87–5.11)
RDW: 13.4 % (ref 11.5–15.5)
WBC: 6.1 10*3/uL (ref 4.0–10.5)

## 2016-10-22 MED ORDER — CHLORHEXIDINE GLUCONATE CLOTH 2 % EX PADS: 6.0000 | MEDICATED_PAD | Freq: Once | CUTANEOUS | Status: DC

## 2016-10-22 NOTE — Progress Notes (Addendum)
CLE:[id number], [name], MD  Cardiologist: pt denies  EKG: 05/2016 in [facility]  Stress test: pt denies ever  ECHO:pt denies ever  Cardiac Cath:pt denies ever  Chest x-ray: 05/2016 in [facility] ([facility])

## 2016-10-22 NOTE — Pre-Procedure Instructions (Signed)
Heather Wilcox  10/22/2016      [place], [place] - [address] [place] [address] Phone: [phone] Fax: [phone]   Your procedure is scheduled on October 24, 2016. Report to [facility] Admitting at 1015 AM. Call this number if you have problems the morning of surgery:[phone]   Remember:  Do not eat food or drink liquids after midnight.  Take these medicines the morning of surgery with A SIP OF WATER metoprolol (lopressor), levothyroxine (synthroid), hydrocodone-acetaminophen (norco).  7 days prior to surgery STOP taking any Aspirin, Aleve, Naproxen, Ibuprofen, Motrin, Advil, Goody's, BC's, all herbal medications, fish oil, and all vitamins  Do not wear jewelry, make-up or nail polish.  Do not wear lotions, powders, or perfumes, or deoderant.  Do not shave 48 hours prior to surgery.  Men may shave face and neck.  Do not bring valuables to the hospital.  [facility] is not responsible for any belongings or valuables.  Contacts, dentures or bridgework may not be worn into surgery.  Leave your suitcase in the car.  After surgery it may be brought to your room.  For patients admitted to the hospital, discharge time will be determined by your treatment team.  Patients discharged the day of surgery will not be allowed to drive home.   Special instructions:   [facility]- Preparing For Surgery  Before surgery, you can play an important role. Because skin is not sterile, your skin needs to be as free of germs as possible. You can reduce the number of germs on your skin by washing with CHG (chlorahexidine gluconate) Soap before surgery.  CHG is an antiseptic cleaner which kills germs and bonds with the skin to continue killing germs even after washing.  Please do not use if you have an allergy to CHG or antibacterial soaps. If your skin becomes reddened/irritated stop using the CHG.  Do not shave  (including legs and underarms) for at least 48 hours prior to first CHG shower. It is OK to shave your face.  Please follow these instructions carefully.   1. Shower the NIGHT BEFORE SURGERY and the MORNING OF SURGERY with CHG.   2. If you chose to wash your hair, wash your hair first as usual with your normal shampoo.  3. After you shampoo, rinse your hair and body thoroughly to remove the shampoo.  4. Use CHG as you would any other liquid soap. You can apply CHG directly to the skin and wash gently with a scrungie or a clean washcloth.   5. Apply the CHG Soap to your body ONLY FROM THE NECK DOWN.  Do not use on open wounds or open sores. Avoid contact with your eyes, ears, mouth and genitals (private parts). Wash genitals (private parts) with your normal soap.  6. Wash thoroughly, paying special attention to the area where your surgery will be performed.  7. Thoroughly rinse your body with warm water from the neck down.  8. DO NOT shower/wash with your normal soap after using and rinsing off the CHG Soap.  9. Pat yourself dry with a CLEAN TOWEL.   10. Wear CLEAN PAJAMAS   11. Place CLEAN SHEETS on your bed the night of your first shower and DO NOT SLEEP WITH PETS.    Day of Surgery: Do not apply any deodorants/lotions. Please wear clean clothes to the hospital/surgery center.     Please read over the following fact  sheets that you were given. Pain Booklet, Coughing and Deep Breathing and Surgical Site Infection Prevention

## 2016-10-23 LAB — CEA: CEA: 4 ng/mL (ref 0.0–4.7)

## 2016-10-23 MED ORDER — CEFOTETAN DISODIUM-DEXTROSE 2-2.08 GM-% IV SOLR
2.0000 g | INTRAVENOUS | Status: AC
  Administered 2016-10-24: 2 g via INTRAVENOUS
  Filled 2016-10-23: qty 50

## 2016-10-24 ENCOUNTER — Encounter (HOSPITAL_COMMUNITY)
Admission: RE | Disposition: A | Discharge status: Home / Self Care | Payer: Self-pay | Source: Ambulatory Visit | Attending: General Surgery

## 2016-10-24 ENCOUNTER — Inpatient Hospital Stay (HOSPITAL_COMMUNITY): Payer: Medicare Other | Admitting: Certified Registered Nurse Anesthetist

## 2016-10-24 ENCOUNTER — Encounter (HOSPITAL_COMMUNITY): Payer: Self-pay | Admitting: *Deleted

## 2016-10-24 ENCOUNTER — Inpatient Hospital Stay (HOSPITAL_COMMUNITY)
Admission: RE | Admit: 2016-10-24 | Discharge: 2016-10-29 | DRG: 331 | Disposition: A | Discharge status: Home / Self Care | Payer: Medicare Other | Source: Ambulatory Visit | Attending: General Surgery | Admitting: General Surgery

## 2016-10-24 DIAGNOSIS — K6389 Other specified diseases of intestine: Secondary | ICD-10-CM | POA: Diagnosis not present

## 2016-10-24 DIAGNOSIS — D374 Neoplasm of uncertain behavior of colon: Principal | ICD-10-CM | POA: Diagnosis present

## 2016-10-24 DIAGNOSIS — I10 Essential (primary) hypertension: Secondary | ICD-10-CM | POA: Diagnosis not present

## 2016-10-24 DIAGNOSIS — D122 Benign neoplasm of ascending colon: Secondary | ICD-10-CM | POA: Diagnosis not present

## 2016-10-24 DIAGNOSIS — K469 Unspecified abdominal hernia without obstruction or gangrene: Secondary | ICD-10-CM | POA: Diagnosis present

## 2016-10-24 DIAGNOSIS — K639 Disease of intestine, unspecified: Secondary | ICD-10-CM | POA: Diagnosis present

## 2016-10-24 HISTORY — PX: LAPAROSCOPIC RIGHT COLECTOMY: SHX5925

## 2016-10-24 HISTORY — DX: Malignant neoplasm of colon, unspecified: C18.9

## 2016-10-24 HISTORY — PX: LAPAROSCOPIC PARTIAL RIGHT COLECTOMY: SHX5913

## 2016-10-24 HISTORY — DX: Unspecified osteoarthritis, unspecified site: M19.90

## 2016-10-24 LAB — BASIC METABOLIC PANEL
ANION GAP: 10 (ref 5–15)
BUN: 14 mg/dL (ref 6–20)
CHLORIDE: 101 mmol/L (ref 101–111)
CO2: 23 mmol/L (ref 22–32)
Calcium: 8.9 mg/dL (ref 8.9–10.3)
Creatinine, Ser: 1.01 mg/dL — ABNORMAL HIGH (ref 0.44–1.00)
GFR calc Af Amer: >60 mL/min (ref >60)
GFR calc non Af Amer: 53 mL/min — ABNORMAL LOW (ref >60)
Glucose, Bld: 155 mg/dL — ABNORMAL HIGH (ref 65–99)
POTASSIUM: 2.8 mmol/L — AB (ref 3.5–5.1)
SODIUM: 134 mmol/L — AB (ref 135–145)

## 2016-10-24 SURGERY — COLECTOMY, RIGHT, LAPAROSCOPIC
Anesthesia: General | Site: Abdomen | Laterality: Right

## 2016-10-24 MED ORDER — ONDANSETRON HCL 4 MG/2ML IJ SOLN: 4.0000 mg | Freq: Four times a day (QID) | INTRAMUSCULAR | Status: DC | PRN

## 2016-10-24 MED ORDER — PHENYLEPHRINE HCL 10 MG/ML IJ SOLN
INTRAVENOUS | Status: DC | PRN
  Administered 2016-10-24: 20 ug/min via INTRAVENOUS

## 2016-10-24 MED ORDER — BUPIVACAINE-EPINEPHRINE (PF) 0.25% -1:200000 IJ SOLN
INTRAMUSCULAR | Status: AC
  Filled 2016-10-24: qty 30

## 2016-10-24 MED ORDER — KCL IN DEXTROSE-NACL 20-5-0.9 MEQ/L-%-% IV SOLN
INTRAVENOUS | Status: DC
  Administered 2016-10-24 – 2016-10-27 (×5): via INTRAVENOUS
  Filled 2016-10-24 (×8): qty 1000

## 2016-10-24 MED ORDER — PROPOFOL 10 MG/ML IV BOLUS
INTRAVENOUS | Status: AC
  Filled 2016-10-24: qty 20

## 2016-10-24 MED ORDER — FENTANYL CITRATE (PF) 250 MCG/5ML IJ SOLN
INTRAMUSCULAR | Status: AC
  Filled 2016-10-24: qty 5

## 2016-10-24 MED ORDER — 0.9 % SODIUM CHLORIDE (POUR BTL) OPTIME
TOPICAL | Status: DC | PRN
  Administered 2016-10-24 (×2): 1000 mL

## 2016-10-24 MED ORDER — LISINOPRIL 40 MG PO TABS
40.0000 mg | ORAL_TABLET | Freq: Every day | ORAL | Status: DC
  Administered 2016-10-25 – 2016-10-29 (×5): 40 mg via ORAL
  Filled 2016-10-24 (×5): qty 1

## 2016-10-24 MED ORDER — POVIDONE-IODINE 10 % EX OINT
TOPICAL_OINTMENT | CUTANEOUS | Status: AC
  Filled 2016-10-24: qty 28.35

## 2016-10-24 MED ORDER — MORPHINE SULFATE (PF) 4 MG/ML IV SOLN
1.0000 mg | INTRAVENOUS | Status: DC | PRN
  Administered 2016-10-24: 4 mg via INTRAVENOUS
  Administered 2016-10-24: 2 mg via INTRAVENOUS
  Administered 2016-10-24: 4 mg via INTRAVENOUS
  Administered 2016-10-25: 2 mg via INTRAVENOUS
  Administered 2016-10-25 – 2016-10-26 (×5): 4 mg via INTRAVENOUS
  Filled 2016-10-24 (×9): qty 1

## 2016-10-24 MED ORDER — PHENYLEPHRINE HCL 10 MG/ML IJ SOLN
INTRAMUSCULAR | Status: DC | PRN
  Administered 2016-10-24 (×4): 40 ug via INTRAVENOUS

## 2016-10-24 MED ORDER — METOPROLOL TARTRATE 100 MG PO TABS
100.0000 mg | ORAL_TABLET | Freq: Every day | ORAL | Status: DC
  Administered 2016-10-25 – 2016-10-29 (×5): 100 mg via ORAL
  Filled 2016-10-24 (×5): qty 1

## 2016-10-24 MED ORDER — PANTOPRAZOLE SODIUM 40 MG IV SOLR
40.0000 mg | Freq: Every day | INTRAVENOUS | Status: DC
  Administered 2016-10-24 – 2016-10-25 (×2): 40 mg via INTRAVENOUS
  Filled 2016-10-24 (×2): qty 40

## 2016-10-24 MED ORDER — HYDROCHLOROTHIAZIDE 50 MG PO TABS
50.0000 mg | ORAL_TABLET | Freq: Every day | ORAL | Status: DC
  Administered 2016-10-25 – 2016-10-29 (×5): 50 mg via ORAL
  Filled 2016-10-24: qty 2
  Filled 2016-10-24: qty 1
  Filled 2016-10-24 (×2): qty 2
  Filled 2016-10-24: qty 1
  Filled 2016-10-24: qty 2
  Filled 2016-10-24: qty 1
  Filled 2016-10-24: qty 2
  Filled 2016-10-24 (×2): qty 1

## 2016-10-24 MED ORDER — PROPOFOL 10 MG/ML IV BOLUS
INTRAVENOUS | Status: DC | PRN
  Administered 2016-10-24: 160 mg via INTRAVENOUS

## 2016-10-24 MED ORDER — LEVOTHYROXINE SODIUM 75 MCG PO TABS
75.0000 ug | ORAL_TABLET | Freq: Every day | ORAL | Status: DC
  Administered 2016-10-25 – 2016-10-29 (×5): 75 ug via ORAL
  Filled 2016-10-24 (×5): qty 1

## 2016-10-24 MED ORDER — HYDRALAZINE HCL 20 MG/ML IJ SOLN
INTRAMUSCULAR | Status: AC
  Filled 2016-10-24: qty 1

## 2016-10-24 MED ORDER — GABAPENTIN 300 MG PO CAPS
ORAL_CAPSULE | ORAL | Status: AC
  Administered 2016-10-24: 300 mg via ORAL
  Filled 2016-10-24: qty 1

## 2016-10-24 MED ORDER — HYDROMORPHONE HCL 1 MG/ML IJ SOLN
0.2500 mg | INTRAMUSCULAR | Status: DC | PRN
  Administered 2016-10-24 (×2): 0.5 mg via INTRAVENOUS

## 2016-10-24 MED ORDER — NEOSTIGMINE METHYLSULFATE 10 MG/10ML IV SOLN
INTRAVENOUS | Status: DC | PRN
  Administered 2016-10-24: 1 mg via INTRAVENOUS
  Administered 2016-10-24: 4 mg via INTRAVENOUS

## 2016-10-24 MED ORDER — ALVIMOPAN 12 MG PO CAPS
12.0000 mg | ORAL_CAPSULE | ORAL | Status: AC
  Administered 2016-10-24: 12 mg via ORAL

## 2016-10-24 MED ORDER — GABAPENTIN 300 MG PO CAPS
300.0000 mg | ORAL_CAPSULE | ORAL | Status: AC
  Administered 2016-10-24: 300 mg via ORAL

## 2016-10-24 MED ORDER — HYDRALAZINE HCL 20 MG/ML IJ SOLN
5.0000 mg | Freq: Once | INTRAMUSCULAR | Status: AC
Start: 2016-10-24 — End: 2016-10-24
  Administered 2016-10-24: 5 mg via INTRAVENOUS

## 2016-10-24 MED ORDER — LIDOCAINE HCL (CARDIAC) 20 MG/ML IV SOLN
INTRAVENOUS | Status: DC | PRN
  Administered 2016-10-24: 100 mg via INTRAVENOUS

## 2016-10-24 MED ORDER — ONDANSETRON 4 MG PO TBDP: 4.0000 mg | ORAL_TABLET | Freq: Four times a day (QID) | ORAL | Status: DC | PRN

## 2016-10-24 MED ORDER — CELECOXIB 200 MG PO CAPS
ORAL_CAPSULE | ORAL | Status: AC
  Administered 2016-10-24: 400 mg via ORAL
  Filled 2016-10-24: qty 2

## 2016-10-24 MED ORDER — CELECOXIB 200 MG PO CAPS
400.0000 mg | ORAL_CAPSULE | ORAL | Status: AC
  Administered 2016-10-24: 400 mg via ORAL

## 2016-10-24 MED ORDER — ACETAMINOPHEN 500 MG PO TABS
ORAL_TABLET | ORAL | Status: AC
  Administered 2016-10-24: 1000 mg via ORAL
  Filled 2016-10-24: qty 2

## 2016-10-24 MED ORDER — ROCURONIUM BROMIDE 100 MG/10ML IV SOLN
INTRAVENOUS | Status: DC | PRN
  Administered 2016-10-24: 50 mg via INTRAVENOUS
  Administered 2016-10-24: 10 mg via INTRAVENOUS

## 2016-10-24 MED ORDER — GLYCOPYRROLATE 0.2 MG/ML IJ SOLN
INTRAMUSCULAR | Status: DC | PRN
  Administered 2016-10-24: 0.2 mg via INTRAVENOUS
  Administered 2016-10-24: .8 mg via INTRAVENOUS

## 2016-10-24 MED ORDER — LISINOPRIL-HYDROCHLOROTHIAZIDE 20-25 MG PO TABS: 2.0000 | ORAL_TABLET | Freq: Every day | ORAL | Status: DC

## 2016-10-24 MED ORDER — LACTATED RINGERS IV SOLN
INTRAVENOUS | Status: DC
  Administered 2016-10-24 (×2): via INTRAVENOUS

## 2016-10-24 MED ORDER — ONDANSETRON HCL 4 MG/2ML IJ SOLN
INTRAMUSCULAR | Status: DC | PRN
  Administered 2016-10-24: 4 mg via INTRAVENOUS

## 2016-10-24 MED ORDER — CLONIDINE HCL 0.1 MG PO TABS
0.1000 mg | ORAL_TABLET | Freq: Every day | ORAL | Status: DC
  Administered 2016-10-24 – 2016-10-28 (×5): 0.1 mg via ORAL
  Filled 2016-10-24 (×5): qty 1

## 2016-10-24 MED ORDER — FENTANYL CITRATE (PF) 100 MCG/2ML IJ SOLN
INTRAMUSCULAR | Status: DC | PRN
  Administered 2016-10-24 (×3): 50 ug via INTRAVENOUS

## 2016-10-24 MED ORDER — DEXAMETHASONE SODIUM PHOSPHATE 10 MG/ML IJ SOLN
INTRAMUSCULAR | Status: AC
  Filled 2016-10-24: qty 3

## 2016-10-24 MED ORDER — PNEUMOCOCCAL VAC POLYVALENT 25 MCG/0.5ML IJ INJ
0.5000 mL | INJECTION | INTRAMUSCULAR | Status: DC
  Filled 2016-10-24 (×2): qty 0.5

## 2016-10-24 MED ORDER — PROMETHAZINE HCL 25 MG/ML IJ SOLN: 6.2500 mg | INTRAMUSCULAR | Status: DC | PRN

## 2016-10-24 MED ORDER — ALVIMOPAN 12 MG PO CAPS
ORAL_CAPSULE | ORAL | Status: AC
  Administered 2016-10-24: 12 mg via ORAL
  Filled 2016-10-24: qty 1

## 2016-10-24 MED ORDER — ACETAMINOPHEN 500 MG PO TABS
1000.0000 mg | ORAL_TABLET | ORAL | Status: AC
  Administered 2016-10-24: 1000 mg via ORAL

## 2016-10-24 MED ORDER — HYDROMORPHONE HCL 1 MG/ML IJ SOLN
INTRAMUSCULAR | Status: AC
  Filled 2016-10-24: qty 1

## 2016-10-24 MED ORDER — DEXAMETHASONE SODIUM PHOSPHATE 10 MG/ML IJ SOLN
INTRAMUSCULAR | Status: DC | PRN
  Administered 2016-10-24: 10 mg via INTRAVENOUS

## 2016-10-24 MED ORDER — HEPARIN SODIUM (PORCINE) 5000 UNIT/ML IJ SOLN
5000.0000 [IU] | Freq: Three times a day (TID) | INTRAMUSCULAR | Status: DC
  Administered 2016-10-25 – 2016-10-29 (×13): 5000 [IU] via SUBCUTANEOUS
  Filled 2016-10-24 (×12): qty 1

## 2016-10-24 MED ORDER — ALVIMOPAN 12 MG PO CAPS
12.0000 mg | ORAL_CAPSULE | Freq: Two times a day (BID) | ORAL | Status: DC
  Administered 2016-10-25 – 2016-10-26 (×3): 12 mg via ORAL
  Filled 2016-10-24 (×3): qty 1

## 2016-10-24 MED ORDER — METHOCARBAMOL 500 MG PO TABS
500.0000 mg | ORAL_TABLET | Freq: Four times a day (QID) | ORAL | Status: DC | PRN
  Administered 2016-10-25 – 2016-10-27 (×2): 500 mg via ORAL
  Filled 2016-10-24 (×2): qty 1

## 2016-10-24 SURGICAL SUPPLY — 65 items
APPLIER CLIP ROT 10 11.4 M/L (STAPLE)
BLADE CLIPPER SURG (BLADE) ×3 IMPLANT
CANISTER SUCT 3000ML PPV (MISCELLANEOUS) ×3 IMPLANT
CELLS DAT CNTRL 66122 CELL SVR (MISCELLANEOUS) IMPLANT
CHLORAPREP W/TINT 26ML (MISCELLANEOUS) ×3 IMPLANT
CLIP APPLIE ROT 10 11.4 M/L (STAPLE) IMPLANT
COVER MAYO STAND STRL (DRAPES) ×6 IMPLANT
COVER SURGICAL LIGHT HANDLE (MISCELLANEOUS) ×6 IMPLANT
DRAPE HALF SHEET 40X57 (DRAPES) ×3 IMPLANT
DRAPE LAPAROSCOPIC ABDOMINAL (DRAPES) ×3 IMPLANT
DRAPE UTILITY XL STRL (DRAPES) ×6 IMPLANT
DRAPE WARM FLUID 44X44 (DRAPE) ×3 IMPLANT
DRSG OPSITE POSTOP 4X10 (GAUZE/BANDAGES/DRESSINGS) IMPLANT
DRSG OPSITE POSTOP 4X8 (GAUZE/BANDAGES/DRESSINGS) ×3 IMPLANT
DRSG TEGADERM 2-3/8X2-3/4 SM (GAUZE/BANDAGES/DRESSINGS) ×3 IMPLANT
ELECT CAUTERY BLADE 6.4 (BLADE) ×6 IMPLANT
ELECT REM PT RETURN 9FT ADLT (ELECTROSURGICAL) ×3
ELECTRODE REM PT RTRN 9FT ADLT (ELECTROSURGICAL) ×1 IMPLANT
GAUZE SPONGE 2X2 8PLY STRL LF (GAUZE/BANDAGES/DRESSINGS) ×1 IMPLANT
GLOVE BIO SURGEON STRL SZ7 (GLOVE) ×3 IMPLANT
GLOVE BIO SURGEON STRL SZ7.5 (GLOVE) ×6 IMPLANT
GLOVE BIOGEL PI IND STRL 7.5 (GLOVE) ×1 IMPLANT
GLOVE BIOGEL PI INDICATOR 7.5 (GLOVE) ×2
GLOVE ECLIPSE 7.5 STRL STRAW (GLOVE) ×3 IMPLANT
GOWN STRL REUS W/ TWL LRG LVL3 (GOWN DISPOSABLE) ×8 IMPLANT
GOWN STRL REUS W/TWL LRG LVL3 (GOWN DISPOSABLE) ×16
KIT BASIN OR (CUSTOM PROCEDURE TRAY) ×3 IMPLANT
KIT ROOM TURNOVER OR (KITS) ×3 IMPLANT
LIGASURE IMPACT 36 18CM CVD LR (INSTRUMENTS) ×3 IMPLANT
NS IRRIG 1000ML POUR BTL (IV SOLUTION) ×6 IMPLANT
PAD ARMBOARD 7.5X6 YLW CONV (MISCELLANEOUS) ×6 IMPLANT
PENCIL BUTTON HOLSTER BLD 10FT (ELECTRODE) ×6 IMPLANT
RELOAD PROXIMATE 75MM BLUE (ENDOMECHANICALS) ×6 IMPLANT
RTRCTR WOUND ALEXIS 18CM MED (MISCELLANEOUS)
SCISSORS LAP 5X35 DISP (ENDOMECHANICALS) IMPLANT
SET IRRIG TUBING LAPAROSCOPIC (IRRIGATION / IRRIGATOR) IMPLANT
SHEARS HARMONIC ACE PLUS 36CM (ENDOMECHANICALS) ×3 IMPLANT
SLEEVE ENDOPATH XCEL 5M (ENDOMECHANICALS) ×6 IMPLANT
SPECIMEN JAR LARGE (MISCELLANEOUS) ×3 IMPLANT
SPONGE GAUZE 2X2 STER 10/PKG (GAUZE/BANDAGES/DRESSINGS) ×2
STAPLER GUN LINEAR PROX 60 (STAPLE) ×3 IMPLANT
STAPLER PROXIMATE 75MM BLUE (STAPLE) ×3 IMPLANT
STAPLER VISISTAT 35W (STAPLE) ×3 IMPLANT
SUT PDS AB 1 CT  36 (SUTURE) ×4
SUT PDS AB 1 CT 36 (SUTURE) ×2 IMPLANT
SUT SILK 2 0 (SUTURE)
SUT SILK 2 0 SH CR/8 (SUTURE) ×3 IMPLANT
SUT SILK 2-0 18XBRD TIE 12 (SUTURE) IMPLANT
SUT SILK 3 0 (SUTURE)
SUT SILK 3 0 SH CR/8 (SUTURE) ×3 IMPLANT
SUT SILK 3-0 18XBRD TIE 12 (SUTURE) IMPLANT
SYR BULB IRRIGATION 50ML (SYRINGE) ×6 IMPLANT
SYS LAPSCP GELPORT 120MM (MISCELLANEOUS)
SYSTEM LAPSCP GELPORT 120MM (MISCELLANEOUS) IMPLANT
TOWEL OR 17X26 10 PK STRL BLUE (TOWEL DISPOSABLE) ×3 IMPLANT
TRAY FOLEY W/METER SILVER 16FR (SET/KITS/TRAYS/PACK) ×3 IMPLANT
TRAY LAPAROSCOPIC MC (CUSTOM PROCEDURE TRAY) ×3 IMPLANT
TROCAR XCEL 12X100 BLDLESS (ENDOMECHANICALS) IMPLANT
TROCAR XCEL BLUNT TIP 100MML (ENDOMECHANICALS) IMPLANT
TROCAR XCEL NON-BLD 11X100MML (ENDOMECHANICALS) IMPLANT
TROCAR XCEL NON-BLD 5MMX100MML (ENDOMECHANICALS) ×3 IMPLANT
TUBE CONNECTING 12'X1/4 (SUCTIONS) ×2
TUBE CONNECTING 12X1/4 (SUCTIONS) ×4 IMPLANT
TUBING INSUFFLATION (TUBING) ×3 IMPLANT
YANKAUER SUCT BULB TIP NO VENT (SUCTIONS) ×6 IMPLANT

## 2016-10-24 NOTE — Anesthesia Preprocedure Evaluation (Addendum)
Anesthesia Evaluation  Patient identified by MRN, date of birth, ID band Patient awake    Reviewed: Allergy & Precautions, NPO status , Patient's Chart, lab work & pertinent test results  History of Anesthesia Complications Negative for: history of anesthetic complications  Airway Mallampati: II  TM Distance: >3 FB Neck ROM: Full    Dental  (+) Missing, Poor Dentition, Dental Advisory Given   Pulmonary Current Smoker,    breath sounds clear to auscultation       Cardiovascular hypertension,  Rhythm:Regular Rate:Normal     Neuro/Psych    GI/Hepatic negative GI ROS, Neg liver ROS,   Endo/Other  diabetesHypothyroidism   Renal/GU      Musculoskeletal  (+) Arthritis ,   Abdominal   Peds  Hematology   Anesthesia Other Findings   Reproductive/Obstetrics                            Anesthesia Physical Anesthesia Plan  ASA: III  Anesthesia Plan: General   Post-op Pain Management:    Induction: Intravenous  PONV Risk Score and Plan: 4 or greater and Ondansetron, Dexamethasone, Propofol and Midazolam  Airway Management Planned: Nasal ETT  Additional Equipment:   Intra-op Plan:   Post-operative Plan: Extubation in OR  Informed Consent: I have reviewed the patients History and Physical, chart, labs and discussed the procedure including the risks, benefits and alternatives for the proposed anesthesia with the patient or authorized representative who has indicated his/her understanding and acceptance.   Dental advisory given  Plan Discussed with: CRNA  Anesthesia Plan Comments:         Anesthesia Quick Evaluation

## 2016-10-24 NOTE — H&P (Signed)
Heather Wilcox  Location: [facility] Patient #: [id number] DOB: January 04, 1941 Widowed / Language: English / Race: Black or African American Female   History of Present Illness  The patient is a 76 year old female who presents for a follow-up for Colonic mass. We are asked to see the patient in consultation by Dr. Wilford Wilcox to evaluate her for a colon mass. The patient is a 76 year old black female who is a well-known patient is. She recently underwent a colonoscopy at which time several polyps were found. All of the polyps were benign but there were 2 that were of concern. One polyp in the right colon was benign but had low-grade dysplasia. A second polyp at the hepatic flexure was too large to be removed. The biopsy of this was benign but there was concern for sampling error because of its size. She initially presented with bleeding with her stools that had been going on for about one year. She has not had any further bleeding with her stools since the colonoscopy. She denies any recent weight loss. Her appetite is good and her bowels are moving regularly. She does have a history of surgery for a bleeding gastric ulcer about 20 years ago.   Allergies  Allergies Reconciled   Medication History  Lisinopril (10MG  Tablet, Oral) Active. Metoprolol Succinate ER (200MG  Tablet ER 24HR, Oral) Active. Metoprolol Succinate ER (100MG  Tablet ER 24HR, Oral) Active. Norvasc (10MG  Tablet, Oral) Active. Medications Reconciled    Review of Systems Skin Present- Non-Healing Wounds. Not Present- Change in Wart/Mole, Dryness, Hives, Jaundice, New Lesions, Rash and Ulcer. HEENT Not Present- Earache, Hearing Loss, Hoarseness, Nose Bleed, Oral Ulcers, Ringing in the Ears, Seasonal Allergies, Sinus Pain, Sore Throat, Visual Disturbances, Wears glasses/contact lenses and Yellow Eyes. Breast Not Present- Breast Mass, Breast Pain, Nipple Discharge and Skin Changes. Cardiovascular  Not Present- Chest Pain, Difficulty Breathing Lying Down, Leg Cramps, Palpitations, Rapid Heart Rate, Shortness of Breath and Swelling of Extremities. Gastrointestinal Present- Rectal Pain. Not Present- Abdominal Pain, Bloating, Bloody Stool, Change in Bowel Habits, Chronic diarrhea, Constipation, Difficulty Swallowing, Excessive gas, Gets full quickly at meals, Hemorrhoids, Indigestion, Nausea and Vomiting. Female Genitourinary Not Present- Frequency, Nocturia, Painful Urination, Pelvic Pain and Urgency. Musculoskeletal Not Present- Back Pain, Joint Pain, Joint Stiffness, Muscle Pain, Muscle Weakness and Swelling of Extremities. Neurological Not Present- Decreased Memory, Fainting, Headaches, Numbness, Seizures, Tingling, Tremor, Trouble walking and Weakness. Hematology Not Present- Blood Thinners, Easy Bruising, Excessive bleeding, Gland problems, HIV and Persistent Infections.  Vitals  Weight: 175.2 lb Height: 67in Body Surface Area: 1.91 m Body Mass Index: 27.44 kg/m  Temp.: 97.82F  Pulse: 83 (Regular)  BP: 120/82 (Sitting, Left Arm, Standard)       Physical Exam General Mental Status-Alert. General Appearance-Consistent with stated age. Hydration-Well hydrated. Voice-Normal.  Head and Neck Head-normocephalic, atraumatic with no lesions or palpable masses. Trachea-midline. Thyroid Gland Characteristics - normal size and consistency.  Eye Eyeball - Bilateral-Extraocular movements intact. Sclera/Conjunctiva - Bilateral-No scleral icterus.  Chest and Lung Exam Chest and lung exam reveals -quiet, even and easy respiratory effort with no use of accessory muscles and on auscultation, normal breath sounds, no adventitious sounds and normal vocal resonance. Inspection Chest Wall - Normal. Back - normal.  Cardiovascular Cardiovascular examination reveals -normal heart sounds, regular rate and rhythm with no murmurs and normal pedal pulses  bilaterally.  Abdomen Note: The abdomen is soft and nontender. There is no palpable mass. There is a well-healed upper midline scar. There is  no palpable groin or supraclavicular lymphadenopathy   Neurologic Neurologic evaluation reveals -alert and oriented x 3 with no impairment of recent or remote memory. Mental Status-Normal.  Musculoskeletal Normal Exam - Left-Upper Extremity Strength Normal and Lower Extremity Strength Normal. Normal Exam - Right-Upper Extremity Strength Normal and Lower Extremity Strength Normal.  Lymphatic Head & Neck  General Head & Neck Lymphatics: Bilateral - Description - Normal. Axillary  General Axillary Region: Bilateral - Description - Normal. Tenderness - Non Tender. Femoral & Inguinal  Generalized Femoral & Inguinal Lymphatics: Bilateral - Description - Normal. Tenderness - Non Tender.    Assessment & Plan  ADENOMA OF COLON AT HEPATIC FLEXURE (D12.3) Impression: The patient recently underwent a colonoscopy at which time several polyps were removed. All were benign and completely removed except for one polyp in the right colon that had some low-grade dysplasia. There was a second polyp at the hepatic flexure which could not be completely removed. The biopsy of this polyp was benign but it was felt that it was not completely sampled because of its size. Because the polyp could not be removed and because of the risk of cancer I think it would be reasonable to resect this area of colon. The area has been tattooed so that we can find it readily. She would require a right colectomy. I have discussed with her in detail the risks and benefits of the operation as well as some of the technical aspects and she understands and wishes to proceed. We will obtain a CT scan to further evaluate the area prior to surgery. Current Plans CT ABDOMEN AND PELVIS W CONTRAST [phone])

## 2016-10-24 NOTE — Interval H&P Note (Signed)
History and Physical Interval Note:  10/24/2016 11:29 AM  Heather Wilcox  has presented today for surgery, with the diagnosis of Right colon mass  The various methods of treatment have been discussed with the patient and family. After consideration of risks, benefits and other options for treatment, the patient has consented to  Procedure(s): LAPAROSCOPIC ASSISTED RIGHT COLECTOMY (Right) as a surgical intervention .  The patient's history has been reviewed, patient examined, no change in status, stable for surgery.  I have reviewed the patient's chart and labs.  Questions were answered to the patient's satisfaction.     TOTH III,PAUL S

## 2016-10-24 NOTE — Anesthesia Procedure Notes (Signed)
Procedure Name: Intubation Date/Time: 10/24/2016 12:09 PM Performed by: Valda Favia Pre-anesthesia Checklist: Patient identified, Emergency Drugs available, Suction available, Patient being monitored and Timeout performed Patient Re-evaluated:Patient Re-evaluated prior to inductionOxygen Delivery Method: Circle system utilized Preoxygenation: Pre-oxygenation with 100% oxygen Intubation Type: IV induction Ventilation: Mask ventilation without difficulty and Oral airway inserted - appropriate to patient size Laryngoscope Size: Mac and 4 Grade View: Grade I Tube type: Oral Tube size: 7.0 mm Number of attempts: 1 Airway Equipment and Method: Stylet Placement Confirmation: ETT inserted through vocal cords under direct vision,  positive ETCO2 and breath sounds checked- equal and bilateral Secured at: 19 cm Tube secured with: Tape Dental Injury: Teeth and Oropharynx as per pre-operative assessment

## 2016-10-24 NOTE — Transfer of Care (Signed)
Immediate Anesthesia Transfer of Care Note  Patient: Heather Wilcox  Procedure(s) Performed: Procedure(s): LAPAROSCOPIC ASSISTED RIGHT COLECTOMY (Right)  Patient Location: PACU  Anesthesia Type:General  Level of Consciousness: awake and alert   Airway & Oxygen Therapy: Patient Spontanous Breathing and Patient connected to nasal cannula oxygen  Post-op Assessment: Report given to RN and Post -op Vital signs reviewed and stable  Post vital signs: Reviewed and stable  Last Vitals:  Vitals:   10/24/16 1035 10/24/16 1430  BP:  (!) 164/80  Pulse: 86 64  Resp: 20 (!) 21  Temp:  36.4 C    Last Pain:  Vitals:   10/24/16 1030  TempSrc: Oral         Complications: No apparent anesthesia complications

## 2016-10-24 NOTE — Op Note (Addendum)
10/24/2016  2:12 PM  PATIENT:  Heather Wilcox  76 y.o. female  PRE-OPERATIVE DIAGNOSIS:  Right colon mass  POST-OPERATIVE DIAGNOSIS:  Right colon mass  PROCEDURE:  Procedure(s): LAPAROSCOPIC ASSISTED RIGHT COLECTOMY (Right)  SURGEON:  Surgeon(s) and Role:    Armandina Gemma, MD - Assisting    * Jovita Kussmaul, MD - Primary  PHYSICIAN ASSISTANT:   ASSISTANTS: Dr. Harlow Asa   ANESTHESIA:   general  EBL:  Total I/O In: 1000 [I.V.:1000] Out: 75 [Urine:75]  BLOOD ADMINISTERED:none  DRAINS: none   LOCAL MEDICATIONS USED:  MARCAINE     SPECIMEN:  Source of Specimen:  terminal ileum and right colon  DISPOSITION OF SPECIMEN:  PATHOLOGY  COUNTS:  YES  TOURNIQUET:  * No tourniquets in log *  DICTATION: .Dragon Dictation   After informed consent was obtained the patient was brought to the operating room and placed in the supine position on the operating room table. After adequate induction of general anesthesia the patient's abdomen was prepped with ChloraPrep, allowed to dry, and draped in usual sterile manner. An appropriate timeout was performed. A site was chosen in the left upper quadrant for accessing the abdominal cavity. This area was infiltrated with quarter percent Marcaine. A small stab incision was made with a 15 blade knife. A 5 mm Optiview port and camera were used to bluntly dissect through the layers of the abdominal wall under direct vision until access was gained to the abdominal cavity. The abdomen was then insufflated with carbon dioxide without difficulty. The abdomen was then inspected. There were some omental adhesions to the upper midline but these were fairly filmy. No other scar tissue was noted. Another 5 mm port was placed under direct vision in the left lower quadrant and a third 5 mm port was placed in the right lower quadrant all under direct vision. Next the right colon was mobilized by incising the retroperitoneal attachments along the white line of Toldt.  Some of the transverse colon was mobilized as well. We were able to identify the area of tattooing at the hepatic flexure. Once there was good mobilization of the right colon I elected to make an upper midline incision through her old incision with a 10 blade knife. The incision was carried through the skin and subcutaneous tissue sharply with electrocautery until the linea alba was identified. The linea alba was incised with the electrocautery and the preperitoneal space was probed bluntly until the peritoneum was opened and access was gained to the abdominal cavity. The rest of the incision was opened under direct vision. The rest of the transverse colon on the right side was mobilized by combination of blunt finger dissection and some sharp dissection with the electrocautery and Harmonic scalpel. I was unable to bring the entire right colon up into the wound. A site was chosen beyond the area of tattooing where there was good blood supply to the transverse colon for division of the transverse colon. The mesentery at this point was opened sharply with electrocautery. A GIA-75 stapler was placed across the colon at this point, clamped, and fired thereby dividing the colon between staple lines. A similar site on the terminal ileum was chosen for division of the small bowel. The mesentery at this point was opened sharply with the electrocautery. A GIA-75 stapler was placed across the small bowel at that point, clamped, and fired thereby dividing the small bowel between staple lines. The mesentery to the right colon was then taken down sharply  with the LigaSure. The 2 main vessels were clamped with Kelly clamps, divided, and ligated with 2-0 silk ties. Care was taken to avoid the duodenum. The terminal ileum and colon were then sent to pathology for further evaluation. Next the small bowel and transverse colon approximated each other easily with no tension. A small opening was made with the electrocautery on the  antimesenteric edge of the small bowel and transverse colon. Each limb of a GIA-75 stapler was then placed down the appropriate limb of small bowel or colon, clamped, and fired thereby creating a nice widely patent enteroenterostomy. The common opening was closed with a TA 60 stapler. The staple line was then reinforced with multiple 2-0 silk Lembert stitches including a 2-0 silk crotch stitch. The mesentery was then closed with interrupted 2-0 silk stitches. The anastomosis appeared healthy and had no tension. It was placed back in the abdominal cavity. The abdomen was then irrigated with copious amounts of saline. No other abnormalities were noted on general inspection. Next all gown and gloves and drapes were changed. A small hernia defect was identified along the midline fascia and this was opened and reduced. The fascial edges appeared healthy. The fascia of the abdominal wall was then closed with 2 running #1 double-stranded loop PDS sutures. The subcutaneous tissue was irrigated with copious amounts of saline. The skin was then closed with staples. Sterile dressings were applied. The patient tolerated the procedure well. At the end of the case all needle sponge and instrument counts were correct. The patient was then awakened and taken to recovery in stable condition. The assistant was essential for retraction and visualization during the case.  PLAN OF CARE: Admit to inpatient   PATIENT DISPOSITION:  PACU - hemodynamically stable.   Delay start of Pharmacological VTE agent (>24hrs) due to surgical blood loss or risk of bleeding: no

## 2016-10-24 NOTE — Anesthesia Postprocedure Evaluation (Signed)
Anesthesia Post Note  Patient: Heather Wilcox  Procedure(s) Performed: Procedure(s) (LRB): LAPAROSCOPIC ASSISTED RIGHT COLECTOMY (Right)     Patient location during evaluation: PACU Anesthesia Type: General Level of consciousness: awake and alert Pain management: pain level controlled Vital Signs Assessment: post-procedure vital signs reviewed and stable Respiratory status: spontaneous breathing, nonlabored ventilation and respiratory function stable Cardiovascular status: blood pressure returned to baseline and stable Postop Assessment: no signs of nausea or vomiting Anesthetic complications: no    Last Vitals:  Vitals:   10/24/16 1515 10/24/16 1530  BP: (!) 174/82 (!) 153/69  Pulse: (!) 55 65  Resp: 19 16  Temp:  36.6 C    Last Pain:  Vitals:   10/24/16 1530  TempSrc:   PainSc: 3                  Woodson Macha,W. EDMOND

## 2016-10-25 ENCOUNTER — Encounter (HOSPITAL_COMMUNITY): Payer: Self-pay | Admitting: General Surgery

## 2016-10-25 NOTE — Progress Notes (Signed)
1 Day Post-Op   Subjective/Chief Complaint: Complains of pain at incision but seems to be ok   Objective: Vital signs in last 24 hours: Temp:  [97.6 F (36.4 C)-99 F (37.2 C)] 98.6 F (37 C) (06/29 0620) Pulse Rate:  [50-86] 62 (06/29 0620) Resp:  [15-21] 17 (06/29 0620) BP: (148-181)/(69-92) 154/69 (06/29 0620) SpO2:  [93 %-100 %] 100 % (06/29 0620) Weight:  [80.7 kg (178 lb)] 80.7 kg (178 lb) (06/28 1039) Last BM Date: 10/23/16  Intake/Output from previous day: 06/28 0701 - 06/29 0700 In: 2400 [I.V.:2400] Out: 2165 [Urine:2125; Blood:40] Intake/Output this shift: No intake/output data recorded.  General appearance: alert and cooperative Resp: clear to auscultation bilaterally Cardio: regular rate and rhythm GI: soft, mild tenderness. few bs  Lab Results:   Recent Labs  10/22/16 1122  WBC 6.1  HGB 11.8*  HCT 37.2  PLT 190   BMET  Recent Labs  10/22/16 1122 10/24/16 1058  NA 135 134*  K 3.3* 2.8*  CL 99* 101  CO2 30 23  GLUCOSE 134* 155*  BUN 18 14  CREATININE 1.05* 1.01*  CALCIUM 8.9 8.9   PT/INR No results for input(Wilcox): LABPROT, INR in the last 72 hours. ABG No results for input(Wilcox): PHART, HCO3 in the last 72 hours.  Invalid input(Wilcox): PCO2, PO2  Studies/Results: No results found.  Anti-infectives: Anti-infectives    Start     Dose/Rate Route Frequency Ordered Stop   10/24/16 1200  cefoTEtan in Dextrose 5% (CEFOTAN) IVPB 2 g     2 g Intravenous To ShortStay Surgical 10/23/16 1342 10/24/16 1213      Assessment/Plan: Wilcox/p Procedure(Wilcox): LAPAROSCOPIC ASSISTED RIGHT COLECTOMY (Right) Continue clears until bowel function returns  Continue entereg Maintain IVF with K being low Ambulate POD 1 lap right colectomy  LOS: 1 day    Heather Wilcox,Heather Wilcox 10/25/2016

## 2016-10-26 LAB — BASIC METABOLIC PANEL
Anion gap: 5 (ref 5–15)
BUN: 5 mg/dL — ABNORMAL LOW (ref 6–20)
CHLORIDE: 101 mmol/L (ref 101–111)
CO2: 29 mmol/L (ref 22–32)
Calcium: 8.2 mg/dL — ABNORMAL LOW (ref 8.9–10.3)
Creatinine, Ser: 0.74 mg/dL (ref 0.44–1.00)
GFR calc Af Amer: >60 mL/min (ref >60)
GFR calc non Af Amer: >60 mL/min (ref >60)
Glucose, Bld: 150 mg/dL — ABNORMAL HIGH (ref 65–99)
POTASSIUM: 3 mmol/L — AB (ref 3.5–5.1)
Sodium: 135 mmol/L (ref 135–145)

## 2016-10-26 MED ORDER — POTASSIUM CHLORIDE CRYS ER 20 MEQ PO TBCR
20.0000 meq | EXTENDED_RELEASE_TABLET | Freq: Two times a day (BID) | ORAL | Status: AC
  Administered 2016-10-26 (×2): 20 meq via ORAL
  Filled 2016-10-26 (×2): qty 1

## 2016-10-26 MED ORDER — HYDRALAZINE HCL 20 MG/ML IJ SOLN
20.0000 mg | INTRAMUSCULAR | Status: DC | PRN
  Administered 2016-10-26 – 2016-10-28 (×3): 20 mg via INTRAVENOUS
  Filled 2016-10-26 (×3): qty 1

## 2016-10-26 MED ORDER — PANTOPRAZOLE SODIUM 40 MG PO TBEC
40.0000 mg | DELAYED_RELEASE_TABLET | Freq: Every day | ORAL | Status: DC
  Administered 2016-10-26 – 2016-10-28 (×3): 40 mg via ORAL
  Filled 2016-10-26 (×3): qty 1

## 2016-10-26 MED ORDER — OXYCODONE HCL 5 MG PO TABS
5.0000 mg | ORAL_TABLET | ORAL | Status: DC | PRN
  Administered 2016-10-26 – 2016-10-29 (×10): 5 mg via ORAL
  Filled 2016-10-26 (×10): qty 1

## 2016-10-26 NOTE — Progress Notes (Signed)
2 Days Post-Op   Subjective/Chief Complaint: No complaints. Feeling better   Objective: Vital signs in last 24 hours: Temp:  [97.5 F (36.4 C)-98.4 F (36.9 C)] 98.4 F (36.9 C) (06/30 0526) Pulse Rate:  [51-66] 52 (06/30 0526) Resp:  [17-18] 18 (06/30 0526) BP: (168-213)/(75-94) 213/83 (06/30 0526) SpO2:  [99 %-100 %] 99 % (06/30 0526) Last BM Date: 10/23/16  Intake/Output from previous day: 06/29 0701 - 06/30 0700 In: 2241.7 [P.O.:480; I.V.:1761.7] Out: 1550 [Urine:1550] Intake/Output this shift: No intake/output data recorded.  General appearance: alert and cooperative Resp: clear to auscultation bilaterally Cardio: regular rate and rhythm GI: soft, mild tenderness. good bs. incision looks good  Lab Results:  No results for input(s): WBC, HGB, HCT, PLT in the last 72 hours. BMET  Recent Labs  10/24/16 1058 10/26/16 0428  NA 134* 135  K 2.8* 3.0*  CL 101 101  CO2 23 29  GLUCOSE 155* 150*  BUN 14 5*  CREATININE 1.01* 0.74  CALCIUM 8.9 8.2*   PT/INR No results for input(s): LABPROT, INR in the last 72 hours. ABG No results for input(s): PHART, HCO3 in the last 72 hours.  Invalid input(s): PCO2, PO2  Studies/Results: No results found.  Anti-infectives: Anti-infectives    Start     Dose/Rate Route Frequency Ordered Stop   10/24/16 1200  cefoTEtan in Dextrose 5% (CEFOTAN) IVPB 2 g     2 g Intravenous To ShortStay Surgical 10/23/16 1342 10/24/16 1213      Assessment/Plan: s/p Procedure(s): LAPAROSCOPIC ASSISTED RIGHT COLECTOMY (Right) Advance diet. Start fulls today Replace K in IVF Add hydralazine prn for HTN Ambulate entereg  LOS: 2 days    TOTH III,Korby Ratay S 10/26/2016

## 2016-10-27 LAB — BASIC METABOLIC PANEL
Anion gap: 6 (ref 5–15)
BUN: 6 mg/dL (ref 6–20)
CO2: 27 mmol/L (ref 22–32)
CREATININE: 0.85 mg/dL (ref 0.44–1.00)
Calcium: 8.4 mg/dL — ABNORMAL LOW (ref 8.9–10.3)
Chloride: 103 mmol/L (ref 101–111)
GFR calc Af Amer: >60 mL/min (ref >60)
Glucose, Bld: 141 mg/dL — ABNORMAL HIGH (ref 65–99)
POTASSIUM: 3 mmol/L — AB (ref 3.5–5.1)
SODIUM: 136 mmol/L (ref 135–145)

## 2016-10-27 MED ORDER — POTASSIUM CHLORIDE CRYS ER 20 MEQ PO TBCR
20.0000 meq | EXTENDED_RELEASE_TABLET | Freq: Two times a day (BID) | ORAL | Status: AC
  Administered 2016-10-27 (×2): 20 meq via ORAL
  Filled 2016-10-27 (×2): qty 1

## 2016-10-27 NOTE — Progress Notes (Signed)
3 Days Post-Op   Subjective/Chief Complaint: Feels good. Had 2 bm's yesterday   Objective: Vital signs in last 24 hours: Temp:  [97.8 F (36.6 C)-98.6 F (37 C)] 98.3 F (36.8 C) (07/01 0521) Pulse Rate:  [56-74] 66 (07/01 0521) Resp:  [18] 18 (07/01 0521) BP: (139-197)/(57-86) 139/57 (07/01 0521) SpO2:  [98 %-100 %] 100 % (07/01 0521) Last BM Date: 10/26/16  Intake/Output from previous day: 06/30 0701 - 07/01 0700 In: 3641.7 [P.O.:960; I.V.:2681.7] Out: 801 [Urine:800; Stool:1] Intake/Output this shift: Total I/O In: 1240 [P.O.:240; I.V.:1000] Out: -   General appearance: alert and cooperative Resp: clear to auscultation bilaterally Cardio: regular rate and rhythm GI: soft, mild tenderness. incision looks good  Lab Results:  No results for input(s): WBC, HGB, HCT, PLT in the last 72 hours. BMET  Recent Labs  10/24/16 1058 10/26/16 0428  NA 134* 135  K 2.8* 3.0*  CL 101 101  CO2 23 29  GLUCOSE 155* 150*  BUN 14 5*  CREATININE 1.01* 0.74  CALCIUM 8.9 8.2*   PT/INR No results for input(s): LABPROT, INR in the last 72 hours. ABG No results for input(s): PHART, HCO3 in the last 72 hours.  Invalid input(s): PCO2, PO2  Studies/Results: No results found.  Anti-infectives: Anti-infectives    Start     Dose/Rate Route Frequency Ordered Stop   10/24/16 1200  cefoTEtan in Dextrose 5% (CEFOTAN) IVPB 2 g     2 g Intravenous To ShortStay Surgical 10/23/16 1342 10/24/16 1213      Assessment/Plan: s/p Procedure(s): LAPAROSCOPIC ASSISTED RIGHT COLECTOMY (Right) Advance diet. Start regular food today Ambulate Hopefully ready for discharge tomorrow Recheck K  LOS: 3 days    TOTH III,Messiyah Waterson S 10/27/2016

## 2016-10-28 NOTE — Progress Notes (Signed)
4 Days Post-Op   Subjective/Chief Complaint: Still has some soreness. Didn't eat much yesterday   Objective: Vital signs in last 24 hours: Temp:  [98.2 F (36.8 C)-98.7 F (37.1 C)] 98.4 F (36.9 C) (07/02 0448) Pulse Rate:  [62-66] 66 (07/02 0448) Resp:  [17-18] 18 (07/02 0448) BP: (143-176)/(67-76) 143/67 (07/02 0448) SpO2:  [99 %-100 %] 99 % (07/02 0448) Last BM Date: 10/27/16  Intake/Output from previous day: 07/01 0701 - 07/02 0700 In: 180 [P.O.:180] Out: 4 [Stool:4] Intake/Output this shift: No intake/output data recorded.  General appearance: alert and cooperative Resp: clear to auscultation bilaterally Cardio: regular rate and rhythm GI: soft, appropriate tenderness  Lab Results:  No results for input(s): WBC, HGB, HCT, PLT in the last 72 hours. BMET  Recent Labs  10/26/16 0428 10/27/16 0656  NA 135 136  K 3.0* 3.0*  CL 101 103  CO2 29 27  GLUCOSE 150* 141*  BUN 5* 6  CREATININE 0.74 0.85  CALCIUM 8.2* 8.4*   PT/INR No results for input(s): LABPROT, INR in the last 72 hours. ABG No results for input(s): PHART, HCO3 in the last 72 hours.  Invalid input(s): PCO2, PO2  Studies/Results: No results found.  Anti-infectives: Anti-infectives    Start     Dose/Rate Route Frequency Ordered Stop   10/24/16 1200  cefoTEtan in Dextrose 5% (CEFOTAN) IVPB 2 g     2 g Intravenous To ShortStay Surgical 10/23/16 1342 10/24/16 1213      Assessment/Plan: s/p Procedure(s): LAPAROSCOPIC ASSISTED RIGHT COLECTOMY (Right) Advance diet  Ambulate Hopefully ready for discharge tomorrow  LOS: 4 days    TOTH III,Osha Errico S 10/28/2016

## 2016-10-29 MED ORDER — HYDROCODONE-ACETAMINOPHEN 5-325 MG PO TABS: 1.0000 | ORAL_TABLET | ORAL | 0 refills | Status: DC | PRN

## 2016-10-29 NOTE — Progress Notes (Signed)
5 Days Post-Op   Subjective/Chief Complaint: No complaints   Objective: Vital signs in last 24 hours: Temp:  [98.2 F (36.8 C)] 98.2 F (36.8 C) (07/02 2048) Pulse Rate:  [60-66] 66 (07/02 2048) Resp:  [18-19] 19 (07/02 2048) BP: (139-166)/(65-84) 139/65 (07/02 2217) SpO2:  [99 %] 99 % (07/02 2048) Last BM Date: 10/28/16  Intake/Output from previous day: 07/02 0701 - 07/03 0700 In: 600 [P.O.:600] Out: -  Intake/Output this shift: No intake/output data recorded.  General appearance: alert and cooperative Resp: clear to auscultation bilaterally Cardio: regular rate and rhythm GI: soft, nontendern. incision ok  Lab Results:  No results for input(s): WBC, HGB, HCT, PLT in the last 72 hours. BMET  Recent Labs  10/27/16 0656  NA 136  K 3.0*  CL 103  CO2 27  GLUCOSE 141*  BUN 6  CREATININE 0.85  CALCIUM 8.4*   PT/INR No results for input(s): LABPROT, INR in the last 72 hours. ABG No results for input(s): PHART, HCO3 in the last 72 hours.  Invalid input(s): PCO2, PO2  Studies/Results: No results found.  Anti-infectives: Anti-infectives    Start     Dose/Rate Route Frequency Ordered Stop   10/24/16 1200  cefoTEtan in Dextrose 5% (CEFOTAN) IVPB 2 g     2 g Intravenous To ShortStay Surgical 10/23/16 1342 10/24/16 1213      Assessment/Plan: s/p Procedure(s): LAPAROSCOPIC ASSISTED RIGHT COLECTOMY (Right) Advance diet Discharge  LOS: 5 days    TOTH III,Dyllan Hughett S 10/29/2016

## 2016-10-31 NOTE — Discharge Summary (Signed)
Physician Discharge Summary  Patient ID: MIYANNA WIERSMA MRN: [id number] DOB/AGE: [id number] 75 y.o.  Admit date: 10/24/2016 Discharge date: 10/31/2016  Admission Diagnoses:  Discharge Diagnoses:  Active Problems:   Villous adenoma of right colon   Discharged Condition: good  Hospital Course: The patient underwent a lap assisted right colectomy. She tolerated surgery well. She gradually improved with incident until she was ready for discharge home.  Consults: None  Significant Diagnostic Studies: none  Treatments: surgery: as above  Discharge Exam: Blood pressure 139/65, pulse 66, temperature 98.2 F (36.8 C), temperature source Oral, resp. rate 19, height 5\' 7"  (1.702 m), weight 80.7 kg (178 lb), SpO2 99 %. General appearance: alert and cooperative Resp: clear to auscultation bilaterally Cardio: regular rate and rhythm GI: soft, nontender  Disposition: 01-Home or Self Care  Discharge Instructions    Call MD for:  difficulty breathing, headache or visual disturbances    Complete by:  As directed    Call MD for:  extreme fatigue    Complete by:  As directed    Call MD for:  hives    Complete by:  As directed    Call MD for:  persistant dizziness or light-headedness    Complete by:  As directed    Call MD for:  persistant nausea and vomiting    Complete by:  As directed    Call MD for:  redness, tenderness, or signs of infection (pain, swelling, redness, odor or green/yellow discharge around incision site)    Complete by:  As directed    Call MD for:  severe uncontrolled pain    Complete by:  As directed    Call MD for:  temperature >100.4    Complete by:  As directed    Diet - low sodium heart healthy    Complete by:  As directed    Discharge instructions    Complete by:  As directed    May shower. No heavy lifting. Diet as tolerated   Increase activity slowly    Complete by:  As directed    No wound care    Complete by:  As directed      Allergies as of  10/29/2016      Reactions   No Known Allergies       Medication List    TAKE these medications   cholecalciferol 1000 units tablet Commonly known as:  VITAMIN D Take 1,000 Units by mouth daily.   cloNIDine 0.1 MG tablet Commonly known as:  CATAPRES Take 0.1 mg by mouth at bedtime.   diphenhydramine-acetaminophen 25-500 MG Tabs tablet Commonly known as:  TYLENOL PM Take 1-2 tablets by mouth daily as needed (for pain/sleep).   folic acid 409 MCG tablet Commonly known as:  FOLVITE Take 400 mcg by mouth daily.   HYDROcodone-acetaminophen 5-325 MG tablet Commonly known as:  NORCO/VICODIN Take 1-2 tablets by mouth every 4 (four) hours as needed for moderate pain. What changed:  Another medication with the same name was added. Make sure you understand how and when to take each.   HYDROcodone-acetaminophen 5-325 MG tablet Commonly known as:  NORCO/VICODIN Take 1-2 tablets by mouth every 4 (four) hours as needed for moderate pain or severe pain. What changed:  You were already taking a medication with the same name, and this prescription was added. Make sure you understand how and when to take each.   levofloxacin 500 MG tablet Commonly known as:  LEVAQUIN Take 1 tablet (500 mg total) by  mouth daily. X 3 days   levothyroxine 75 MCG tablet Commonly known as:  SYNTHROID, LEVOTHROID Take 75 mcg by mouth daily before breakfast.   lisinopril-hydrochlorothiazide 20-25 MG tablet Commonly known as:  PRINZIDE,ZESTORETIC Take 2 tablets by mouth daily. HOLD until PCP follow-up What changed:  additional instructions   metoprolol tartrate 100 MG tablet Commonly known as:  LOPRESSOR Take 100 mg by mouth daily.   oseltamivir 75 MG capsule Commonly known as:  TAMIFLU Take 1 capsule (75 mg total) by mouth 2 (two) times daily. X 3 more days        Signed: TOTH III,Verline Kong S 10/31/2016, 2:10 PM

## 2016-11-07 DIAGNOSIS — I10 Essential (primary) hypertension: Secondary | ICD-10-CM | POA: Diagnosis not present

## 2016-11-07 DIAGNOSIS — Z720 Tobacco use: Secondary | ICD-10-CM | POA: Diagnosis not present

## 2016-11-07 DIAGNOSIS — E039 Hypothyroidism, unspecified: Secondary | ICD-10-CM | POA: Diagnosis not present

## 2016-11-07 DIAGNOSIS — E119 Type 2 diabetes mellitus without complications: Secondary | ICD-10-CM | POA: Diagnosis not present

## 2016-11-07 DIAGNOSIS — Z0000 Encounter for general adult medical examination without abnormal findings: Secondary | ICD-10-CM | POA: Diagnosis not present

## 2016-11-07 DIAGNOSIS — Z01118 Encounter for examination of ears and hearing with other abnormal findings: Secondary | ICD-10-CM | POA: Diagnosis not present

## 2016-11-07 DIAGNOSIS — M1712 Unilateral primary osteoarthritis, left knee: Secondary | ICD-10-CM | POA: Diagnosis not present

## 2016-11-21 DIAGNOSIS — E119 Type 2 diabetes mellitus without complications: Secondary | ICD-10-CM | POA: Diagnosis not present

## 2016-11-21 DIAGNOSIS — I10 Essential (primary) hypertension: Secondary | ICD-10-CM | POA: Diagnosis not present

## 2016-11-21 DIAGNOSIS — E039 Hypothyroidism, unspecified: Secondary | ICD-10-CM | POA: Diagnosis not present

## 2016-12-26 ENCOUNTER — Ambulatory Visit: Payer: Self-pay | Admitting: General Surgery

## 2017-01-08 DIAGNOSIS — H02402 Unspecified ptosis of left eyelid: Secondary | ICD-10-CM | POA: Diagnosis not present

## 2017-01-08 DIAGNOSIS — H53461 Homonymous bilateral field defects, right side: Secondary | ICD-10-CM | POA: Diagnosis not present

## 2017-01-08 DIAGNOSIS — H3540 Unspecified peripheral retinal degeneration: Secondary | ICD-10-CM | POA: Diagnosis not present

## 2017-01-15 ENCOUNTER — Other Ambulatory Visit: Payer: Self-pay | Admitting: Internal Medicine

## 2017-01-15 DIAGNOSIS — Z1231 Encounter for screening mammogram for malignant neoplasm of breast: Secondary | ICD-10-CM

## 2017-02-03 ENCOUNTER — Ambulatory Visit
Admission: RE | Admit: 2017-02-03 | Discharge: 2017-02-03 | Disposition: A | Discharge status: Home / Self Care | Payer: Medicare Other | Source: Ambulatory Visit | Attending: Internal Medicine | Admitting: Internal Medicine

## 2017-02-03 DIAGNOSIS — Z1231 Encounter for screening mammogram for malignant neoplasm of breast: Secondary | ICD-10-CM

## 2017-02-26 DIAGNOSIS — G4709 Other insomnia: Secondary | ICD-10-CM | POA: Diagnosis not present

## 2017-02-26 DIAGNOSIS — E039 Hypothyroidism, unspecified: Secondary | ICD-10-CM | POA: Diagnosis not present

## 2017-02-26 DIAGNOSIS — I10 Essential (primary) hypertension: Secondary | ICD-10-CM | POA: Diagnosis not present

## 2017-02-26 DIAGNOSIS — E119 Type 2 diabetes mellitus without complications: Secondary | ICD-10-CM | POA: Diagnosis not present

## 2017-02-26 DIAGNOSIS — M1712 Unilateral primary osteoarthritis, left knee: Secondary | ICD-10-CM | POA: Diagnosis not present

## 2017-03-18 DIAGNOSIS — H2512 Age-related nuclear cataract, left eye: Secondary | ICD-10-CM | POA: Diagnosis not present

## 2017-03-18 DIAGNOSIS — H43821 Vitreomacular adhesion, right eye: Secondary | ICD-10-CM | POA: Diagnosis not present

## 2017-03-18 DIAGNOSIS — H25043 Posterior subcapsular polar age-related cataract, bilateral: Secondary | ICD-10-CM | POA: Diagnosis not present

## 2017-03-18 DIAGNOSIS — I10 Essential (primary) hypertension: Secondary | ICD-10-CM | POA: Diagnosis not present

## 2017-03-18 DIAGNOSIS — H2513 Age-related nuclear cataract, bilateral: Secondary | ICD-10-CM | POA: Diagnosis not present

## 2017-03-18 DIAGNOSIS — H25013 Cortical age-related cataract, bilateral: Secondary | ICD-10-CM | POA: Diagnosis not present

## 2017-05-02 DIAGNOSIS — H1089 Other conjunctivitis: Secondary | ICD-10-CM | POA: Diagnosis not present

## 2017-05-06 DIAGNOSIS — H1089 Other conjunctivitis: Secondary | ICD-10-CM | POA: Diagnosis not present

## 2017-05-12 DIAGNOSIS — H2589 Other age-related cataract: Secondary | ICD-10-CM | POA: Diagnosis not present

## 2017-05-12 DIAGNOSIS — H2512 Age-related nuclear cataract, left eye: Secondary | ICD-10-CM | POA: Diagnosis not present

## 2017-05-13 DIAGNOSIS — H2511 Age-related nuclear cataract, right eye: Secondary | ICD-10-CM | POA: Diagnosis not present

## 2017-05-28 DIAGNOSIS — Z0000 Encounter for general adult medical examination without abnormal findings: Secondary | ICD-10-CM | POA: Diagnosis not present

## 2017-05-28 DIAGNOSIS — I10 Essential (primary) hypertension: Secondary | ICD-10-CM | POA: Diagnosis not present

## 2017-05-28 DIAGNOSIS — M1712 Unilateral primary osteoarthritis, left knee: Secondary | ICD-10-CM | POA: Diagnosis not present

## 2017-05-28 DIAGNOSIS — E119 Type 2 diabetes mellitus without complications: Secondary | ICD-10-CM | POA: Diagnosis not present

## 2017-06-02 DIAGNOSIS — H2511 Age-related nuclear cataract, right eye: Secondary | ICD-10-CM | POA: Diagnosis not present

## 2017-06-23 DIAGNOSIS — M1712 Unilateral primary osteoarthritis, left knee: Secondary | ICD-10-CM | POA: Diagnosis not present

## 2017-06-23 DIAGNOSIS — E039 Hypothyroidism, unspecified: Secondary | ICD-10-CM | POA: Diagnosis not present

## 2017-06-23 DIAGNOSIS — E119 Type 2 diabetes mellitus without complications: Secondary | ICD-10-CM | POA: Diagnosis not present

## 2017-06-23 DIAGNOSIS — Z0000 Encounter for general adult medical examination without abnormal findings: Secondary | ICD-10-CM | POA: Diagnosis not present

## 2017-06-23 DIAGNOSIS — G4709 Other insomnia: Secondary | ICD-10-CM | POA: Diagnosis not present

## 2017-06-23 DIAGNOSIS — I10 Essential (primary) hypertension: Secondary | ICD-10-CM | POA: Diagnosis not present

## 2017-07-07 DIAGNOSIS — I10 Essential (primary) hypertension: Secondary | ICD-10-CM | POA: Diagnosis not present

## 2017-07-07 DIAGNOSIS — E119 Type 2 diabetes mellitus without complications: Secondary | ICD-10-CM | POA: Diagnosis not present

## 2017-07-07 DIAGNOSIS — M1712 Unilateral primary osteoarthritis, left knee: Secondary | ICD-10-CM | POA: Diagnosis not present

## 2017-08-06 DIAGNOSIS — I10 Essential (primary) hypertension: Secondary | ICD-10-CM | POA: Diagnosis not present

## 2017-08-06 DIAGNOSIS — E119 Type 2 diabetes mellitus without complications: Secondary | ICD-10-CM | POA: Diagnosis not present

## 2017-08-06 DIAGNOSIS — L0291 Cutaneous abscess, unspecified: Secondary | ICD-10-CM | POA: Diagnosis not present

## 2017-08-06 DIAGNOSIS — M1712 Unilateral primary osteoarthritis, left knee: Secondary | ICD-10-CM | POA: Diagnosis not present

## 2017-08-06 DIAGNOSIS — E039 Hypothyroidism, unspecified: Secondary | ICD-10-CM | POA: Diagnosis not present

## 2017-09-25 DIAGNOSIS — E119 Type 2 diabetes mellitus without complications: Secondary | ICD-10-CM | POA: Diagnosis not present

## 2017-09-25 DIAGNOSIS — I10 Essential (primary) hypertension: Secondary | ICD-10-CM | POA: Diagnosis not present

## 2017-09-25 DIAGNOSIS — M1712 Unilateral primary osteoarthritis, left knee: Secondary | ICD-10-CM | POA: Diagnosis not present

## 2017-09-26 DIAGNOSIS — Z0101 Encounter for examination of eyes and vision with abnormal findings: Secondary | ICD-10-CM | POA: Diagnosis not present

## 2017-09-26 DIAGNOSIS — I10 Essential (primary) hypertension: Secondary | ICD-10-CM | POA: Diagnosis not present

## 2017-09-26 DIAGNOSIS — M1712 Unilateral primary osteoarthritis, left knee: Secondary | ICD-10-CM | POA: Diagnosis not present

## 2017-09-26 DIAGNOSIS — E039 Hypothyroidism, unspecified: Secondary | ICD-10-CM | POA: Diagnosis not present

## 2017-09-26 DIAGNOSIS — E119 Type 2 diabetes mellitus without complications: Secondary | ICD-10-CM | POA: Diagnosis not present

## 2017-09-26 DIAGNOSIS — G5139 Clonic hemifacial spasm, unspecified: Secondary | ICD-10-CM | POA: Diagnosis not present

## 2017-09-26 DIAGNOSIS — G4709 Other insomnia: Secondary | ICD-10-CM | POA: Diagnosis not present

## 2017-09-26 DIAGNOSIS — Z0000 Encounter for general adult medical examination without abnormal findings: Secondary | ICD-10-CM | POA: Diagnosis not present

## 2017-09-26 DIAGNOSIS — Z0110 Encounter for examination of ears and hearing without abnormal findings: Secondary | ICD-10-CM | POA: Diagnosis not present

## 2017-09-26 IMAGING — CT CT PELVIS W/ CM
2 of 3 series · 16 of 46 positions shown, 18 images · IV contrast (iopamidol)
Comparison: None.

CLINICAL DATA: Rectal pain, diarrhea and decreased appetite for 4
days.

EXAM:
CT PELVIS WITH CONTRAST
TECHNIQUE: Multidetector CT imaging of the pelvis was performed using the
standard protocol following the bolus administration of intravenous
contrast.
CONTRAST:  1 ZTSCBI-EFF IOPAMIDOL (ZTSCBI-EFF) INJECTION 61%

[Series 2: pelvis with · axial · 0.72mm/px · z∈[+953,+1228]mm · 13 of 65 slices shown, 15 images]
[im 5/65  soft-tissue]
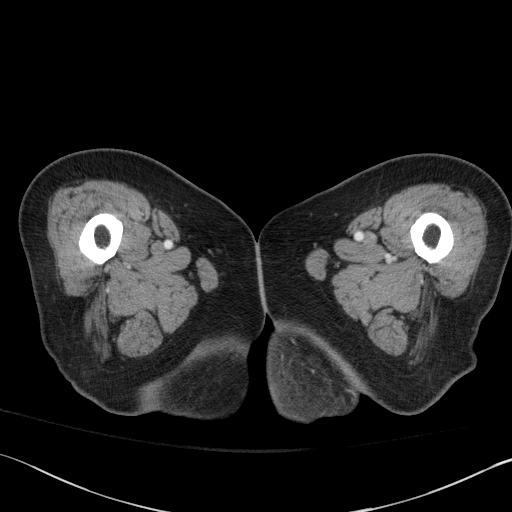
[im 5/65  bone]
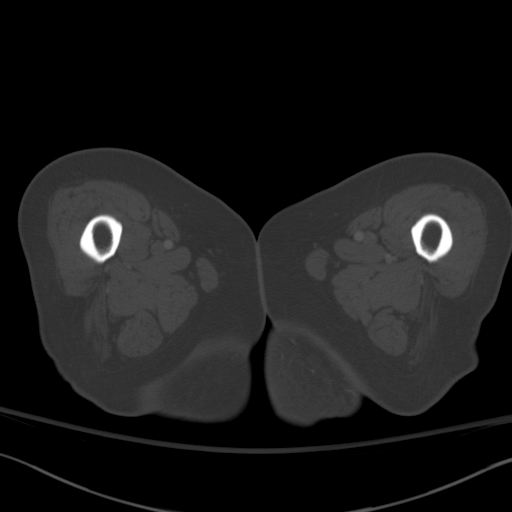
[im 9/65  soft-tissue]
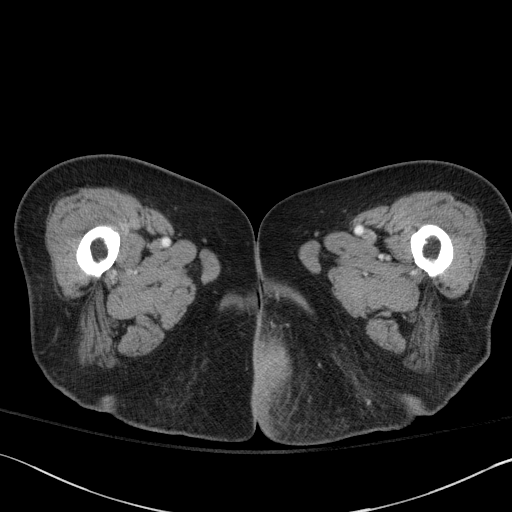
[im 13/65  soft-tissue]
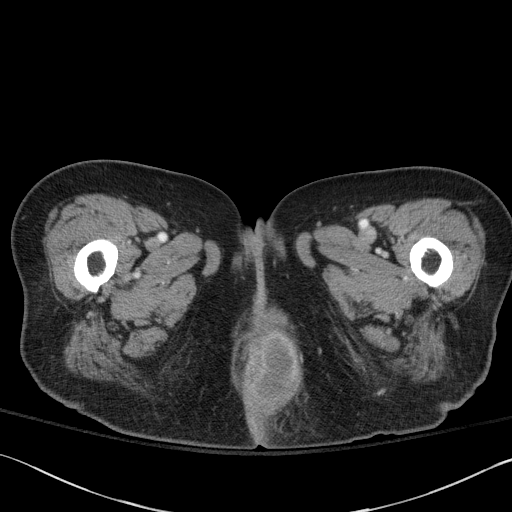
[im 19/65  soft-tissue]
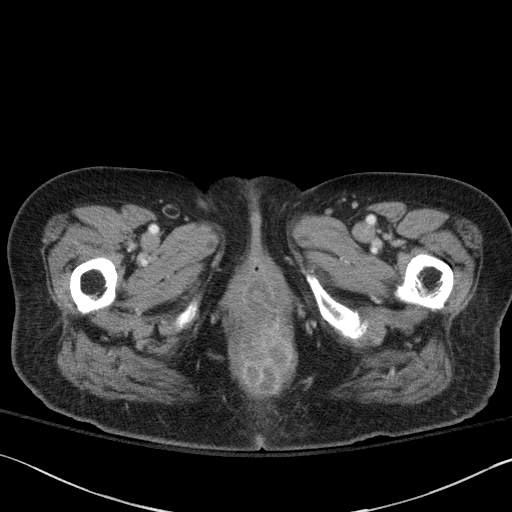
[im 23/65  soft-tissue]
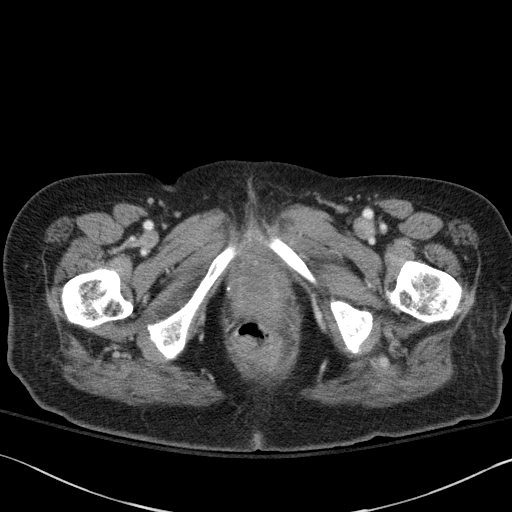
[im 27/65  soft-tissue]
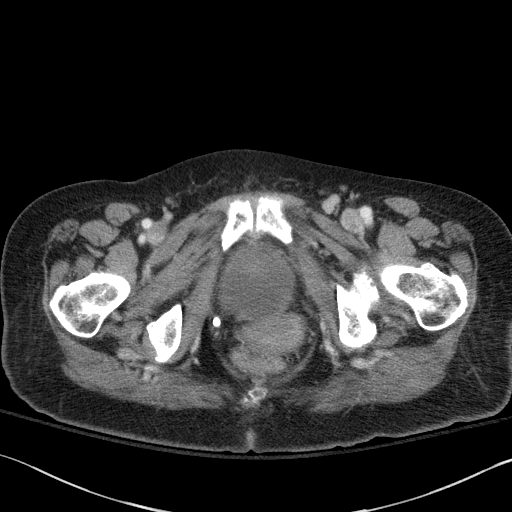
[im 34/65  soft-tissue]
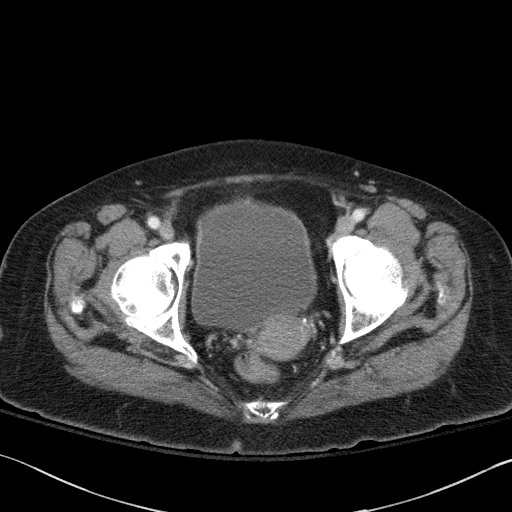
[im 38/65  soft-tissue]
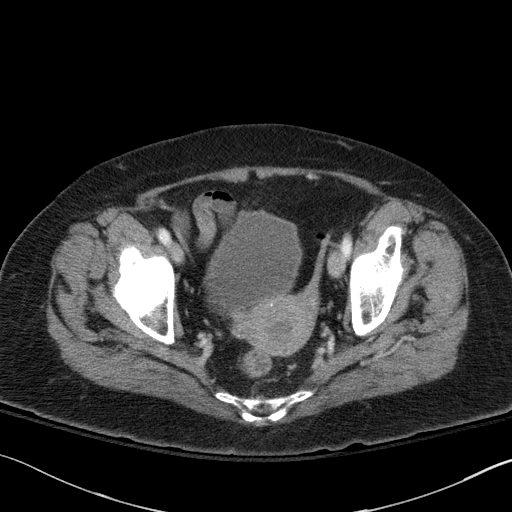
[im 42/65  soft-tissue]
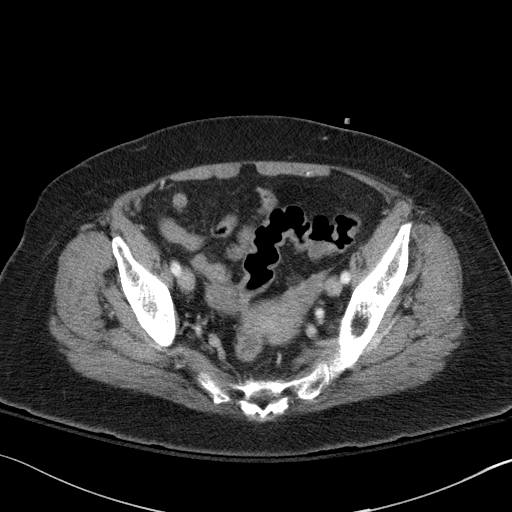
[im 42/65  bone]
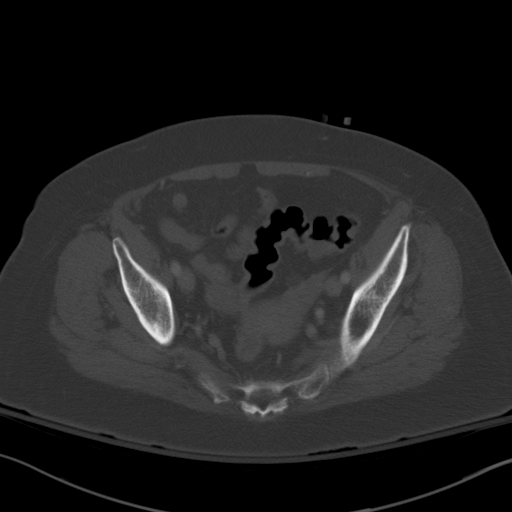
[im 46/65  soft-tissue]
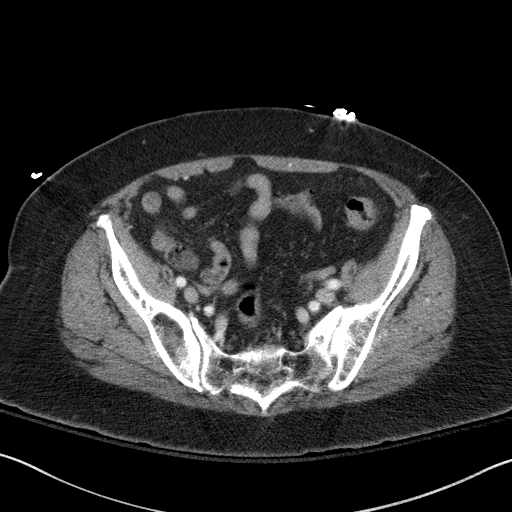
[im 52/65  soft-tissue]
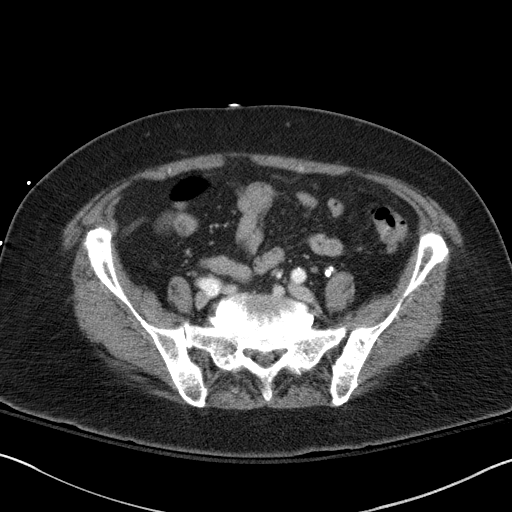
[im 56/65  soft-tissue]
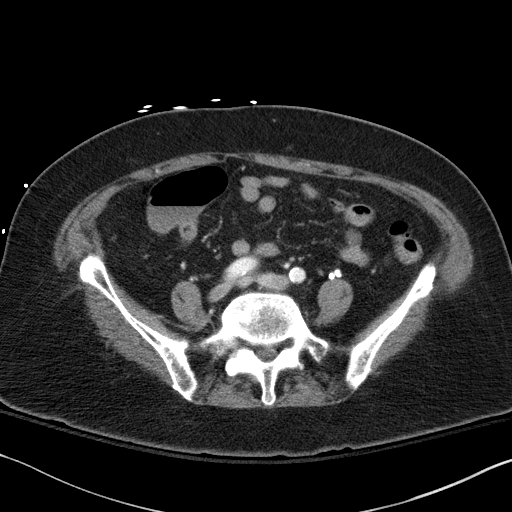
[im 60/65  soft-tissue]
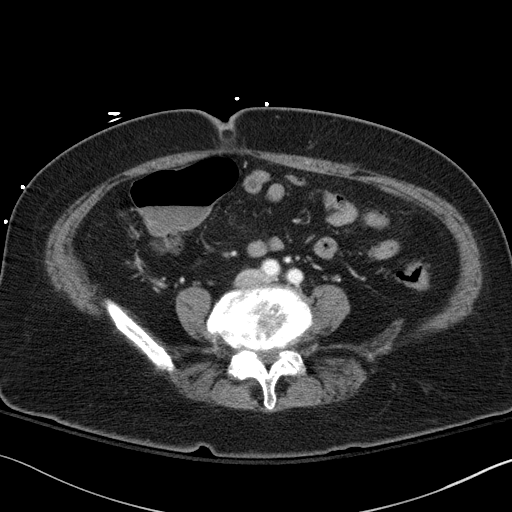

[Series 4: coronal images · coronal · 0.63mm/px · 3 of 132 slices shown]
[im 44/132  soft-tissue]
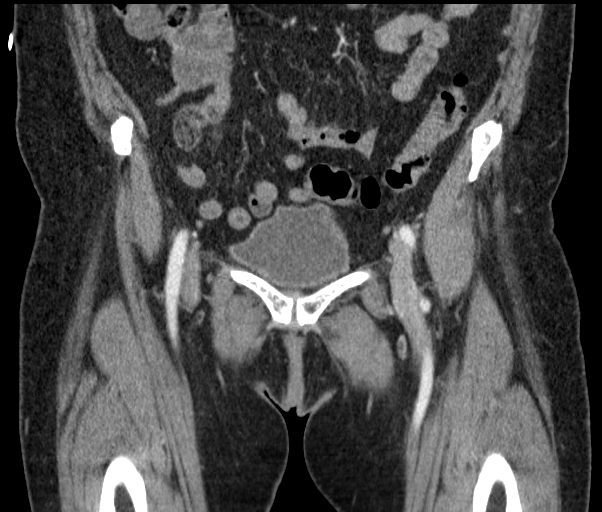
[im 59/132  soft-tissue]
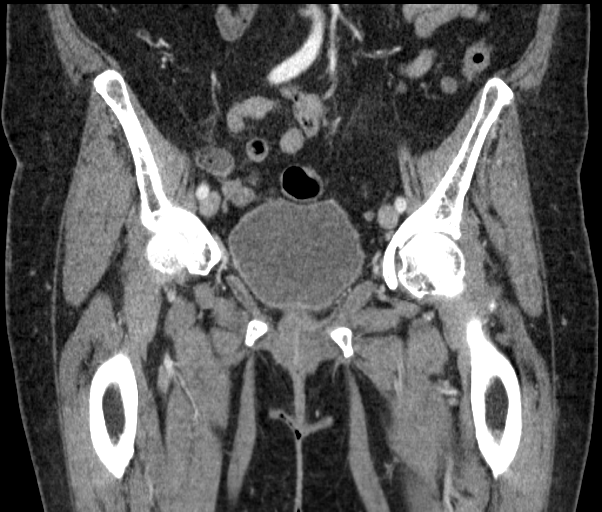
[im 73/132  soft-tissue]
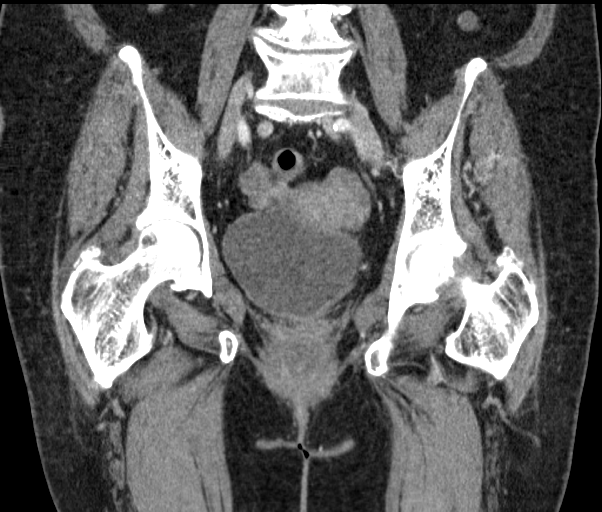

[16 of 46 positions shown; findings below may reference images not displayed]

FINDINGS: Urinary Tract: The ureters appear normal. No obstructing ureteral
calculi or bladder calculi. No asymmetric bladder wall thickening or
bladder mass.

Bowel: The visualize colon and small bowel appear normal. Part of
the appendix is visualized and appears normal. The terminal ileum is
normal. No rectal mass or perirectal adenopathy.

There is a large complex perianal/ perineal abscess. There appears
to be the perianal fistula at the 5 o'clock position extending down
into the complex abscess (5.8 x 4.6 cm). Findings most consistent
with a grade 4 perianal fistula.

Vascular/Lymphatic: The major vascular structures are patent. Distal
aortic and iliac artery calcifications are noted.

No pelvic lymphadenopathy. Small scattered inguinal lymph nodes are
noted. No intra pelvic abscess.

Reproductive: The uterus and ovaries are grossly normal. The
endometrium appears slightly thickened and irregular for age.
Recommend correlation with pelvic ultrasound.

Other:  No other significant findings.

Musculoskeletal: The bony structures are intact.
IMPRESSION: 1. CT findings most consistent with a grade 4 perianal fistula with
a large complex perianal/perineal abscess.
2. No intrapelvic abscess.
3. Slightly thickened and irregular appearing endometrium. Recommend
correlation with pelvic ultrasound. The ovaries appear normal.

## 2017-10-24 DIAGNOSIS — M1712 Unilateral primary osteoarthritis, left knee: Secondary | ICD-10-CM | POA: Diagnosis not present

## 2017-10-24 DIAGNOSIS — E876 Hypokalemia: Secondary | ICD-10-CM | POA: Diagnosis not present

## 2017-10-24 DIAGNOSIS — E119 Type 2 diabetes mellitus without complications: Secondary | ICD-10-CM | POA: Diagnosis not present

## 2017-10-24 DIAGNOSIS — I10 Essential (primary) hypertension: Secondary | ICD-10-CM | POA: Diagnosis not present

## 2017-10-24 DIAGNOSIS — E039 Hypothyroidism, unspecified: Secondary | ICD-10-CM | POA: Diagnosis not present

## 2017-10-24 DIAGNOSIS — G4709 Other insomnia: Secondary | ICD-10-CM | POA: Diagnosis not present

## 2018-01-05 ENCOUNTER — Other Ambulatory Visit: Payer: Self-pay | Admitting: Internal Medicine

## 2018-01-05 DIAGNOSIS — Z1231 Encounter for screening mammogram for malignant neoplasm of breast: Secondary | ICD-10-CM

## 2018-01-26 DIAGNOSIS — E119 Type 2 diabetes mellitus without complications: Secondary | ICD-10-CM | POA: Diagnosis not present

## 2018-01-26 DIAGNOSIS — M1712 Unilateral primary osteoarthritis, left knee: Secondary | ICD-10-CM | POA: Diagnosis not present

## 2018-01-26 DIAGNOSIS — E039 Hypothyroidism, unspecified: Secondary | ICD-10-CM | POA: Diagnosis not present

## 2018-01-26 DIAGNOSIS — I10 Essential (primary) hypertension: Secondary | ICD-10-CM | POA: Diagnosis not present

## 2018-01-26 DIAGNOSIS — G4709 Other insomnia: Secondary | ICD-10-CM | POA: Diagnosis not present

## 2018-02-04 ENCOUNTER — Ambulatory Visit
Admission: RE | Admit: 2018-02-04 | Discharge: 2018-02-04 | Disposition: A | Discharge status: Home / Self Care | Payer: Medicare Other | Source: Ambulatory Visit | Attending: Internal Medicine | Admitting: Internal Medicine

## 2018-02-04 DIAGNOSIS — Z1231 Encounter for screening mammogram for malignant neoplasm of breast: Secondary | ICD-10-CM | POA: Diagnosis not present

## 2018-04-20 DIAGNOSIS — E039 Hypothyroidism, unspecified: Secondary | ICD-10-CM | POA: Diagnosis not present

## 2018-04-20 DIAGNOSIS — I10 Essential (primary) hypertension: Secondary | ICD-10-CM | POA: Diagnosis not present

## 2018-04-20 DIAGNOSIS — M1712 Unilateral primary osteoarthritis, left knee: Secondary | ICD-10-CM | POA: Diagnosis not present

## 2018-04-20 DIAGNOSIS — E119 Type 2 diabetes mellitus without complications: Secondary | ICD-10-CM | POA: Diagnosis not present

## 2018-04-20 DIAGNOSIS — G4709 Other insomnia: Secondary | ICD-10-CM | POA: Diagnosis not present

## 2018-05-28 DIAGNOSIS — I10 Essential (primary) hypertension: Secondary | ICD-10-CM | POA: Diagnosis not present

## 2018-05-28 DIAGNOSIS — E039 Hypothyroidism, unspecified: Secondary | ICD-10-CM | POA: Diagnosis not present

## 2018-05-28 DIAGNOSIS — Z0001 Encounter for general adult medical examination with abnormal findings: Secondary | ICD-10-CM | POA: Diagnosis not present

## 2018-05-28 DIAGNOSIS — E119 Type 2 diabetes mellitus without complications: Secondary | ICD-10-CM | POA: Diagnosis not present

## 2018-05-28 DIAGNOSIS — Z2821 Immunization not carried out because of patient refusal: Secondary | ICD-10-CM | POA: Diagnosis not present

## 2018-05-28 DIAGNOSIS — Z720 Tobacco use: Secondary | ICD-10-CM | POA: Diagnosis not present

## 2018-05-29 ENCOUNTER — Ambulatory Visit: Payer: Medicare Other | Admitting: Family Medicine

## 2018-06-12 DIAGNOSIS — I10 Essential (primary) hypertension: Secondary | ICD-10-CM | POA: Diagnosis not present

## 2018-06-12 DIAGNOSIS — E119 Type 2 diabetes mellitus without complications: Secondary | ICD-10-CM | POA: Diagnosis not present

## 2018-06-12 DIAGNOSIS — E039 Hypothyroidism, unspecified: Secondary | ICD-10-CM | POA: Diagnosis not present

## 2018-06-12 DIAGNOSIS — L0292 Furuncle, unspecified: Secondary | ICD-10-CM | POA: Diagnosis not present

## 2018-06-12 DIAGNOSIS — M1712 Unilateral primary osteoarthritis, left knee: Secondary | ICD-10-CM | POA: Diagnosis not present

## 2018-06-26 DIAGNOSIS — I10 Essential (primary) hypertension: Secondary | ICD-10-CM | POA: Diagnosis not present

## 2018-06-26 DIAGNOSIS — E039 Hypothyroidism, unspecified: Secondary | ICD-10-CM | POA: Diagnosis not present

## 2018-06-26 DIAGNOSIS — E7211 Homocystinuria: Secondary | ICD-10-CM | POA: Diagnosis not present

## 2018-06-26 DIAGNOSIS — E119 Type 2 diabetes mellitus without complications: Secondary | ICD-10-CM | POA: Diagnosis not present

## 2018-06-26 DIAGNOSIS — M1712 Unilateral primary osteoarthritis, left knee: Secondary | ICD-10-CM | POA: Diagnosis not present

## 2018-06-30 ENCOUNTER — Ambulatory Visit (INDEPENDENT_AMBULATORY_CARE_PROVIDER_SITE_OTHER): Payer: Medicare Other | Admitting: Emergency Medicine

## 2018-06-30 ENCOUNTER — Encounter: Payer: Self-pay | Admitting: Emergency Medicine

## 2018-06-30 VITALS — BP 129/79 | HR 77 | Temp 98.1°F | Resp 17 | Ht 67.0 in | Wt 184.0 lb

## 2018-06-30 DIAGNOSIS — H6123 Impacted cerumen, bilateral: Secondary | ICD-10-CM | POA: Diagnosis not present

## 2018-06-30 NOTE — Progress Notes (Signed)
Heather Wilcox 78 y.o.   Chief Complaint  Patient presents with  . Cerumen Impaction    wax in ears per patient     HISTORY OF PRESENT ILLNESS: This is a 78 y.o. female complaining of impacted cerumen in both ears with hearing difficulty as a result.  Has a history of the same 2 years ago.  HPI   Prior to Admission medications   Medication Sig Start Date End Date Taking? Authorizing Provider  cholecalciferol (VITAMIN D) 1000 units tablet Take 1,000 Units by mouth daily.   Yes [provider]  cloNIDine (CATAPRES) 0.1 MG tablet Take 0.1 mg by mouth at bedtime.   Yes [provider]  diphenhydramine-acetaminophen (TYLENOL PM) 25-500 MG TABS tablet Take 1-2 tablets by mouth daily as needed (for pain/sleep).    Yes [provider]  folic acid (FOLVITE) 329 MCG tablet Take 400 mcg by mouth daily.   Yes [provider]  levofloxacin (LEVAQUIN) 500 MG tablet Take 1 tablet (500 mg total) by mouth daily. X 3 days 06/02/16  Yes Rai, Ripudeep K, MD  levothyroxine (SYNTHROID, LEVOTHROID) 75 MCG tablet Take 75 mcg by mouth daily before breakfast.   Yes [provider]  lisinopril-hydrochlorothiazide (PRINZIDE,ZESTORETIC) 20-25 MG tablet Take 2 tablets by mouth daily. HOLD until PCP follow-up Patient taking differently: Take 2 tablets by mouth daily.  06/01/16  Yes Rai, Ripudeep K, MD  metoprolol tartrate (LOPRESSOR) 100 MG tablet Take 100 mg by mouth daily.   Yes [provider]  oseltamivir (TAMIFLU) 75 MG capsule Take 1 capsule (75 mg total) by mouth 2 (two) times daily. X 3 more days 06/02/16  Yes Rai, Ripudeep K, MD    Allergies  Allergen Reactions  . No Known Allergies     Patient Active Problem List   Diagnosis Date Noted  . Villous adenoma of right colon 10/24/2016  . Thrombocytopenia ([place]) 05/30/2016  . MENINGIOMA 10/06/2006  . Hypothyroidism 10/06/2006  . DM 10/06/2006  . GLAUCOMA, PRIMARY OPEN-ANGLE 10/06/2006  . Hypertension  10/06/2006  . DEGENERATIVE JOINT DISEASE, LEFT KNEE 10/06/2006  . THYROIDECTOMY, HX OF 10/06/2006    Past Medical History:  Diagnosis Date  . Arthritis    "left knee" (10/24/2016)  . Colon cancer ([place])    ADENOMA OF COLON AT HEPATIC FLEXURE/notes 10/24/2016  . History of stomach ulcers   . Hypertension   . Hypothyroidism     Past Surgical History:  Procedure Laterality Date  . COLON SURGERY    . COLONOSCOPY    . CRANIOTOMY FOR TUMOR Left 04/2004    temporal craniotomy for meningioma with microdissection/notes 09/11/2010  . INCISION AND DRAINAGE PERIRECTAL ABSCESS N/A 06/13/2016   Procedure: IRRIGATION AND DEBRIDEMENT PERIRECTAL ABSCESS;  Surgeon: Autumn Messing III, MD;  Location: [facility];  Service: General;  Laterality: N/A;  . LAPAROSCOPIC PARTIAL RIGHT COLECTOMY Right 10/24/2016  . LAPAROSCOPIC RIGHT COLECTOMY Right 10/24/2016   Procedure: LAPAROSCOPIC ASSISTED RIGHT COLECTOMY;  Surgeon: Jovita Kussmaul, MD;  Location: [facility];  Service: General;  Laterality: Right;  . REPAIR OF PERFORATED ULCER     "bleeding stomach ulcer"  . TOTAL THYROIDECTOMY      Social History   Socioeconomic History  . Marital status: Widowed    Spouse name: Not on file  . Number of children: Not on file  . Years of education: Not on file  . Highest education level: Not on file  Occupational History  . Not on file  Social Needs  .  Financial resource strain: Not on file  . Food insecurity:    Worry: Not on file    Inability: Not on file  . Transportation needs:    Medical: Not on file    Non-medical: Not on file  Tobacco Use  . Smoking status: Current Some Day Smoker    Packs/day: 0.10    Years: 58.00    Pack years: 5.80    Types: Cigarettes  . Smokeless tobacco: Never Used  Substance and Sexual Activity  . Alcohol use: No  . Drug use: No  . Sexual activity: Never  Lifestyle  . Physical activity:    Days per week: Not on file    Minutes per session: Not on file  . Stress: Not on file    Relationships  . Social connections:    Talks on phone: Not on file    Gets together: Not on file    Attends religious service: Not on file    Active member of club or organization: Not on file    Attends meetings of clubs or organizations: Not on file    Relationship status: Not on file  . Intimate partner violence:    Fear of current or ex partner: Not on file    Emotionally abused: Not on file    Physically abused: Not on file    Forced sexual activity: Not on file  Other Topics Concern  . Not on file  Social History Narrative  . Not on file    Family History  Family history unknown: Yes     Review of Systems  Constitutional: Negative for chills and fever.  HENT: Positive for hearing loss.   Eyes: Negative for blurred vision and double vision.  Respiratory: Negative for shortness of breath.   Gastrointestinal: Negative for abdominal pain, nausea and vomiting.  Neurological: Negative for dizziness and headaches.  All other systems reviewed and are negative.  Vitals:   06/30/18 1357  BP: 129/79  Pulse: 77  Resp: 17  Temp: 98.1 F (36.7 C)  SpO2: 98%     Physical Exam Constitutional:      Appearance: Normal appearance.  HENT:     Head: Normocephalic and atraumatic.     Right Ear: There is impacted cerumen.     Left Ear: There is impacted cerumen.     Mouth/Throat:     Mouth: Mucous membranes are moist.     Pharynx: Oropharynx is clear.  Cardiovascular:     Rate and Rhythm: Normal rate and regular rhythm.     Heart sounds: Normal heart sounds.  Pulmonary:     Effort: Pulmonary effort is normal.     Breath sounds: Normal breath sounds.  Skin:    General: Skin is warm and dry.  Neurological:     Mental Status: She is alert and oriented to person, place, and time.  Psychiatric:        Mood and Affect: Mood normal.        Behavior: Behavior normal.    Ceruminosis is noted.  Wax is removed by syringing and manual debridement. Instructions for home care  to prevent wax buildup are given.   ASSESSMENT & PLAN: [name] was seen today for cerumen impaction.  Diagnoses and all orders for this visit:  Hearing loss due to cerumen impaction, bilateral -     Ear wax removal    Patient Instructions       If you have lab work done today you will be contacted with  your lab results within the next 2 weeks.  If you have not heard from [place] then please contact us. The fastest way to get your results is to register for My Chart.   IF you received an x-ray today, you will receive an invoice from [facility]. Please contact [facility] at [phone] with questions or concerns regarding your invoice.   IF you received labwork today, you will receive an invoice from [facility]. Please contact [facility] at [phone] with questions or concerns regarding your invoice.   Our billing staff will not be able to assist you with questions regarding bills from these companies.  You will be contacted with the lab results as soon as they are available. The fastest way to get your results is to activate your My Chart account. Instructions are located on the last page of this paperwork. If you have not heard from [place] regarding the results in 2 weeks, please contact this office.      Earwax Buildup, Adult The ears produce a substance called earwax that helps keep bacteria out of the ear and protects the skin in the ear canal. Occasionally, earwax can build up in the ear and cause discomfort or hearing loss. What increases the risk? This condition is more likely to develop in people who:  Are female.  Are elderly.  Naturally produce more earwax.  Clean their ears often with cotton swabs.  Use earplugs often.  Use in-ear headphones often.  Wear hearing aids.  Have narrow ear canals.  Have earwax that is overly thick or sticky.  Have eczema.  Are dehydrated.  Have excess hair in the ear canal. What are the signs or  symptoms? Symptoms of this condition include:  Reduced or muffled hearing.  A feeling of fullness in the ear or feeling that the ear is plugged.  Fluid coming from the ear.  Ear pain.  Ear itch.  Ringing in the ear.  Coughing.  An obvious piece of earwax that can be seen inside the ear canal. How is this diagnosed? This condition may be diagnosed based on:  Your symptoms.  Your medical history.  An ear exam. During the exam, your health care provider will look into your ear with an instrument called an otoscope. You may have tests, including a hearing test. How is this treated? This condition may be treated by:  Using ear drops to soften the earwax.  Having the earwax removed by a health care provider. The health care provider may: ? Flush the ear with water. ? Use an instrument that has a loop on the end (curette). ? Use a suction device.  Surgery to remove the wax buildup. This may be done in severe cases. Follow these instructions at home:   Take over-the-counter and prescription medicines only as told by your health care provider.  Do not put any objects, including cotton swabs, into your ear. You can clean the opening of your ear canal with a washcloth or facial tissue.  Follow instructions from your health care provider about cleaning your ears. Do not over-clean your ears.  Drink enough fluid to keep your urine clear or pale yellow. This will help to thin the earwax.  Keep all follow-up visits as told by your health care provider. If earwax builds up in your ears often or if you use hearing aids, consider seeing your health care provider for routine, preventive ear cleanings. Ask your health care provider how often you should schedule your cleanings.  If you  have hearing aids, clean them according to instructions from the manufacturer and your health care provider. Contact a health care provider if:  You have ear pain.  You develop a fever.  You have  blood, pus, or other fluid coming from your ear.  You have hearing loss.  You have ringing in your ears that does not go away.  Your symptoms do not improve with treatment.  You feel like the room is spinning (vertigo). Summary  Earwax can build up in the ear and cause discomfort or hearing loss.  The most common symptoms of this condition include reduced or muffled hearing and a feeling of fullness in the ear or feeling that the ear is plugged.  This condition may be diagnosed based on your symptoms, your medical history, and an ear exam.  This condition may be treated by using ear drops to soften the earwax or by having the earwax removed by a health care provider.  Do not put any objects, including cotton swabs, into your ear. You can clean the opening of your ear canal with a washcloth or facial tissue. This information is not intended to replace advice given to you by your health care provider. Make sure you discuss any questions you have with your health care provider. Document Released: 05/23/2004 Document Revised: 03/27/2017 Document Reviewed: 06/26/2016 Elsevier Interactive Patient Education  2019 [employer]vier Inc.      Agustina Caroli, MD [facility]

## 2018-06-30 NOTE — Patient Instructions (Addendum)
   If you have lab work done today you will be contacted with your lab results within the next 2 weeks.  If you have not heard from us then please contact us. The fastest way to get your results is to register for My Chart.   IF you received an x-ray today, you will receive an invoice from [facility]. Please contact [facility] at [phone] with questions or concerns regarding your invoice.   IF you received labwork today, you will receive an invoice from [facility]. Please contact [facility] at [phone] with questions or concerns regarding your invoice.   Our billing staff will not be able to assist you with questions regarding bills from these companies.  You will be contacted with the lab results as soon as they are available. The fastest way to get your results is to activate your My Chart account. Instructions are located on the last page of this paperwork. If you have not heard from us regarding the results in 2 weeks, please contact this office.     Earwax Buildup, Adult The ears produce a substance called earwax that helps keep bacteria out of the ear and protects the skin in the ear canal. Occasionally, earwax can build up in the ear and cause discomfort or hearing loss. What increases the risk? This condition is more likely to develop in people who:  Are female.  Are elderly.  Naturally produce more earwax.  Clean their ears often with cotton swabs.  Use earplugs often.  Use in-ear headphones often.  Wear hearing aids.  Have narrow ear canals.  Have earwax that is overly thick or sticky.  Have eczema.  Are dehydrated.  Have excess hair in the ear canal. What are the signs or symptoms? Symptoms of this condition include:  Reduced or muffled hearing.  A feeling of fullness in the ear or feeling that the ear is plugged.  Fluid coming from the ear.  Ear pain.  Ear itch.  Ringing in the ear.  Coughing.  An obvious piece of earwax  that can be seen inside the ear canal. How is this diagnosed? This condition may be diagnosed based on:  Your symptoms.  Your medical history.  An ear exam. During the exam, your health care provider will look into your ear with an instrument called an otoscope. You may have tests, including a hearing test. How is this treated? This condition may be treated by:  Using ear drops to soften the earwax.  Having the earwax removed by a health care provider. The health care provider may: ? Flush the ear with water. ? Use an instrument that has a loop on the end (curette). ? Use a suction device.  Surgery to remove the wax buildup. This may be done in severe cases. Follow these instructions at home:   Take over-the-counter and prescription medicines only as told by your health care provider.  Do not put any objects, including cotton swabs, into your ear. You can clean the opening of your ear canal with a washcloth or facial tissue.  Follow instructions from your health care provider about cleaning your ears. Do not over-clean your ears.  Drink enough fluid to keep your urine clear or pale yellow. This will help to thin the earwax.  Keep all follow-up visits as told by your health care provider. If earwax builds up in your ears often or if you use hearing aids, consider seeing your health care provider for routine, preventive ear cleanings. Ask   Ask your health care provider how often you should schedule your cleanings.  If you have hearing aids, clean them according to instructions from the manufacturer and your health care provider. Contact a health care provider if:  You have ear pain.  You develop a fever.  You have blood, pus, or other fluid coming from your ear.  You have hearing loss.  You have ringing in your ears that does not go away.  Your symptoms do not improve with treatment.  You feel like the room is spinning (vertigo). Summary  Earwax can build up in the  ear and cause discomfort or hearing loss.  The most common symptoms of this condition include reduced or muffled hearing and a feeling of fullness in the ear or feeling that the ear is plugged.  This condition may be diagnosed based on your symptoms, your medical history, and an ear exam.  This condition may be treated by using ear drops to soften the earwax or by having the earwax removed by a health care provider.  Do not put any objects, including cotton swabs, into your ear. You can clean the opening of your ear canal with a washcloth or facial tissue. This information is not intended to replace advice given to you by your health care provider. Make sure you discuss any questions you have with your health care provider. Document Released: 05/23/2004 Document Revised: 03/27/2017 Document Reviewed: 06/26/2016 Elsevier Interactive Patient Education  2019 [employer].

## 2018-08-21 DIAGNOSIS — E7211 Homocystinuria: Secondary | ICD-10-CM | POA: Diagnosis not present

## 2018-08-21 DIAGNOSIS — E119 Type 2 diabetes mellitus without complications: Secondary | ICD-10-CM | POA: Diagnosis not present

## 2018-08-21 DIAGNOSIS — I10 Essential (primary) hypertension: Secondary | ICD-10-CM | POA: Diagnosis not present

## 2018-08-21 DIAGNOSIS — E039 Hypothyroidism, unspecified: Secondary | ICD-10-CM | POA: Diagnosis not present

## 2018-08-21 DIAGNOSIS — M1712 Unilateral primary osteoarthritis, left knee: Secondary | ICD-10-CM | POA: Diagnosis not present

## 2018-11-13 DIAGNOSIS — Z0100 Encounter for examination of eyes and vision without abnormal findings: Secondary | ICD-10-CM | POA: Diagnosis not present

## 2018-11-13 DIAGNOSIS — E039 Hypothyroidism, unspecified: Secondary | ICD-10-CM | POA: Diagnosis not present

## 2018-11-13 DIAGNOSIS — Z0000 Encounter for general adult medical examination without abnormal findings: Secondary | ICD-10-CM | POA: Diagnosis not present

## 2018-11-13 DIAGNOSIS — I10 Essential (primary) hypertension: Secondary | ICD-10-CM | POA: Diagnosis not present

## 2018-11-13 DIAGNOSIS — E119 Type 2 diabetes mellitus without complications: Secondary | ICD-10-CM | POA: Diagnosis not present

## 2018-12-29 DIAGNOSIS — R197 Diarrhea, unspecified: Secondary | ICD-10-CM | POA: Diagnosis not present

## 2019-01-01 ENCOUNTER — Other Ambulatory Visit: Payer: Self-pay | Admitting: Internal Medicine

## 2019-01-01 DIAGNOSIS — Z1231 Encounter for screening mammogram for malignant neoplasm of breast: Secondary | ICD-10-CM

## 2019-02-03 DIAGNOSIS — Z1159 Encounter for screening for other viral diseases: Secondary | ICD-10-CM | POA: Diagnosis not present

## 2019-02-08 DIAGNOSIS — K644 Residual hemorrhoidal skin tags: Secondary | ICD-10-CM | POA: Diagnosis not present

## 2019-02-08 DIAGNOSIS — D125 Benign neoplasm of sigmoid colon: Secondary | ICD-10-CM | POA: Diagnosis not present

## 2019-02-08 DIAGNOSIS — K5730 Diverticulosis of large intestine without perforation or abscess without bleeding: Secondary | ICD-10-CM | POA: Diagnosis not present

## 2019-02-08 DIAGNOSIS — D128 Benign neoplasm of rectum: Secondary | ICD-10-CM | POA: Diagnosis not present

## 2019-02-08 DIAGNOSIS — Z86010 Personal history of colonic polyps: Secondary | ICD-10-CM | POA: Diagnosis not present

## 2019-02-08 DIAGNOSIS — Z980 Intestinal bypass and anastomosis status: Secondary | ICD-10-CM | POA: Diagnosis not present

## 2019-02-08 DIAGNOSIS — D124 Benign neoplasm of descending colon: Secondary | ICD-10-CM | POA: Diagnosis not present

## 2019-02-08 DIAGNOSIS — K635 Polyp of colon: Secondary | ICD-10-CM | POA: Diagnosis not present

## 2019-02-08 DIAGNOSIS — D123 Benign neoplasm of transverse colon: Secondary | ICD-10-CM | POA: Diagnosis not present

## 2019-02-11 DIAGNOSIS — D125 Benign neoplasm of sigmoid colon: Secondary | ICD-10-CM | POA: Diagnosis not present

## 2019-02-11 DIAGNOSIS — D124 Benign neoplasm of descending colon: Secondary | ICD-10-CM | POA: Diagnosis not present

## 2019-02-11 DIAGNOSIS — D123 Benign neoplasm of transverse colon: Secondary | ICD-10-CM | POA: Diagnosis not present

## 2019-02-11 DIAGNOSIS — K635 Polyp of colon: Secondary | ICD-10-CM | POA: Diagnosis not present

## 2019-02-11 DIAGNOSIS — D128 Benign neoplasm of rectum: Secondary | ICD-10-CM | POA: Diagnosis not present

## 2019-02-12 DIAGNOSIS — I10 Essential (primary) hypertension: Secondary | ICD-10-CM | POA: Diagnosis not present

## 2019-02-12 DIAGNOSIS — M1712 Unilateral primary osteoarthritis, left knee: Secondary | ICD-10-CM | POA: Diagnosis not present

## 2019-02-12 DIAGNOSIS — E7211 Homocystinuria: Secondary | ICD-10-CM | POA: Diagnosis not present

## 2019-02-12 DIAGNOSIS — E119 Type 2 diabetes mellitus without complications: Secondary | ICD-10-CM | POA: Diagnosis not present

## 2019-02-12 DIAGNOSIS — E039 Hypothyroidism, unspecified: Secondary | ICD-10-CM | POA: Diagnosis not present

## 2019-02-24 ENCOUNTER — Other Ambulatory Visit: Payer: Self-pay

## 2019-02-24 ENCOUNTER — Ambulatory Visit
Admission: RE | Admit: 2019-02-24 | Discharge: 2019-02-24 | Disposition: A | Discharge status: Home / Self Care | Payer: Medicare Other | Source: Ambulatory Visit | Attending: Internal Medicine | Admitting: Internal Medicine

## 2019-02-24 DIAGNOSIS — Z1231 Encounter for screening mammogram for malignant neoplasm of breast: Secondary | ICD-10-CM

## 2019-03-01 DIAGNOSIS — E119 Type 2 diabetes mellitus without complications: Secondary | ICD-10-CM | POA: Diagnosis not present

## 2019-03-01 DIAGNOSIS — M1712 Unilateral primary osteoarthritis, left knee: Secondary | ICD-10-CM | POA: Diagnosis not present

## 2019-03-01 DIAGNOSIS — E7211 Homocystinuria: Secondary | ICD-10-CM | POA: Diagnosis not present

## 2019-03-01 DIAGNOSIS — E039 Hypothyroidism, unspecified: Secondary | ICD-10-CM | POA: Diagnosis not present

## 2019-03-01 DIAGNOSIS — I10 Essential (primary) hypertension: Secondary | ICD-10-CM | POA: Diagnosis not present

## 2019-05-19 DIAGNOSIS — E119 Type 2 diabetes mellitus without complications: Secondary | ICD-10-CM | POA: Diagnosis not present

## 2019-05-19 DIAGNOSIS — I10 Essential (primary) hypertension: Secondary | ICD-10-CM | POA: Diagnosis not present

## 2019-05-19 DIAGNOSIS — E7211 Homocystinuria: Secondary | ICD-10-CM | POA: Diagnosis not present

## 2019-05-19 DIAGNOSIS — E039 Hypothyroidism, unspecified: Secondary | ICD-10-CM | POA: Diagnosis not present

## 2019-05-19 DIAGNOSIS — M1712 Unilateral primary osteoarthritis, left knee: Secondary | ICD-10-CM | POA: Diagnosis not present

## 2019-06-09 DIAGNOSIS — E119 Type 2 diabetes mellitus without complications: Secondary | ICD-10-CM | POA: Diagnosis not present

## 2019-06-09 DIAGNOSIS — I10 Essential (primary) hypertension: Secondary | ICD-10-CM | POA: Diagnosis not present

## 2019-06-09 DIAGNOSIS — Z0001 Encounter for general adult medical examination with abnormal findings: Secondary | ICD-10-CM | POA: Diagnosis not present

## 2019-06-09 DIAGNOSIS — E039 Hypothyroidism, unspecified: Secondary | ICD-10-CM | POA: Diagnosis not present

## 2019-06-09 DIAGNOSIS — M1712 Unilateral primary osteoarthritis, left knee: Secondary | ICD-10-CM | POA: Diagnosis not present

## 2019-08-11 DIAGNOSIS — E119 Type 2 diabetes mellitus without complications: Secondary | ICD-10-CM | POA: Diagnosis not present

## 2019-08-11 DIAGNOSIS — I10 Essential (primary) hypertension: Secondary | ICD-10-CM | POA: Diagnosis not present

## 2019-08-11 DIAGNOSIS — E039 Hypothyroidism, unspecified: Secondary | ICD-10-CM | POA: Diagnosis not present

## 2019-08-11 DIAGNOSIS — M1712 Unilateral primary osteoarthritis, left knee: Secondary | ICD-10-CM | POA: Diagnosis not present

## 2019-08-11 DIAGNOSIS — E7211 Homocystinuria: Secondary | ICD-10-CM | POA: Diagnosis not present

## 2019-11-03 DIAGNOSIS — I10 Essential (primary) hypertension: Secondary | ICD-10-CM | POA: Diagnosis not present

## 2019-11-03 DIAGNOSIS — Z0000 Encounter for general adult medical examination without abnormal findings: Secondary | ICD-10-CM | POA: Diagnosis not present

## 2019-11-03 DIAGNOSIS — E119 Type 2 diabetes mellitus without complications: Secondary | ICD-10-CM | POA: Diagnosis not present

## 2019-11-03 DIAGNOSIS — Z0101 Encounter for examination of eyes and vision with abnormal findings: Secondary | ICD-10-CM | POA: Diagnosis not present

## 2019-11-03 DIAGNOSIS — Z0110 Encounter for examination of ears and hearing without abnormal findings: Secondary | ICD-10-CM | POA: Diagnosis not present

## 2019-11-03 DIAGNOSIS — M1712 Unilateral primary osteoarthritis, left knee: Secondary | ICD-10-CM | POA: Diagnosis not present

## 2020-01-24 DIAGNOSIS — I10 Essential (primary) hypertension: Secondary | ICD-10-CM | POA: Diagnosis not present

## 2020-01-24 DIAGNOSIS — E039 Hypothyroidism, unspecified: Secondary | ICD-10-CM | POA: Diagnosis not present

## 2020-01-24 DIAGNOSIS — K611 Rectal abscess: Secondary | ICD-10-CM | POA: Diagnosis not present

## 2020-01-24 DIAGNOSIS — E119 Type 2 diabetes mellitus without complications: Secondary | ICD-10-CM | POA: Diagnosis not present

## 2020-01-24 DIAGNOSIS — M1712 Unilateral primary osteoarthritis, left knee: Secondary | ICD-10-CM | POA: Diagnosis not present

## 2020-01-27 DIAGNOSIS — K611 Rectal abscess: Secondary | ICD-10-CM | POA: Diagnosis not present

## 2020-02-07 ENCOUNTER — Other Ambulatory Visit: Payer: Self-pay | Admitting: Physician Assistant

## 2020-02-07 DIAGNOSIS — Z1231 Encounter for screening mammogram for malignant neoplasm of breast: Secondary | ICD-10-CM

## 2020-02-21 DIAGNOSIS — E039 Hypothyroidism, unspecified: Secondary | ICD-10-CM | POA: Diagnosis not present

## 2020-02-21 DIAGNOSIS — M1712 Unilateral primary osteoarthritis, left knee: Secondary | ICD-10-CM | POA: Diagnosis not present

## 2020-02-21 DIAGNOSIS — I10 Essential (primary) hypertension: Secondary | ICD-10-CM | POA: Diagnosis not present

## 2020-02-21 DIAGNOSIS — E7211 Homocystinuria: Secondary | ICD-10-CM | POA: Diagnosis not present

## 2020-02-21 DIAGNOSIS — E119 Type 2 diabetes mellitus without complications: Secondary | ICD-10-CM | POA: Diagnosis not present

## 2020-03-01 ENCOUNTER — Ambulatory Visit
Admission: RE | Admit: 2020-03-01 | Discharge: 2020-03-01 | Disposition: A | Discharge status: Home / Self Care | Payer: Medicare Other | Source: Ambulatory Visit | Attending: Physician Assistant | Admitting: Physician Assistant

## 2020-03-01 ENCOUNTER — Other Ambulatory Visit: Payer: Self-pay

## 2020-03-01 DIAGNOSIS — Z1231 Encounter for screening mammogram for malignant neoplasm of breast: Secondary | ICD-10-CM | POA: Diagnosis not present

## 2020-03-13 DIAGNOSIS — K642 Third degree hemorrhoids: Secondary | ICD-10-CM | POA: Diagnosis not present

## 2020-04-17 DIAGNOSIS — I10 Essential (primary) hypertension: Secondary | ICD-10-CM | POA: Diagnosis not present

## 2020-04-17 DIAGNOSIS — E039 Hypothyroidism, unspecified: Secondary | ICD-10-CM | POA: Diagnosis not present

## 2020-04-17 DIAGNOSIS — M1712 Unilateral primary osteoarthritis, left knee: Secondary | ICD-10-CM | POA: Diagnosis not present

## 2020-04-17 DIAGNOSIS — E7211 Homocystinuria: Secondary | ICD-10-CM | POA: Diagnosis not present

## 2020-04-17 DIAGNOSIS — E119 Type 2 diabetes mellitus without complications: Secondary | ICD-10-CM | POA: Diagnosis not present

## 2020-04-26 DIAGNOSIS — I10 Essential (primary) hypertension: Secondary | ICD-10-CM | POA: Diagnosis not present

## 2020-06-08 IMAGING — MG DIGITAL SCREENING BILAT W/ TOMO
6 of 12 series · 6 of 36 positions shown · non-contrast
Comparison: Previous exam(s).

CLINICAL DATA: Screening.

EXAM:
DIGITAL SCREENING BILATERAL MAMMOGRAM WITH TOMO AND CAD

[R CC synth-2D (1 of 2)]
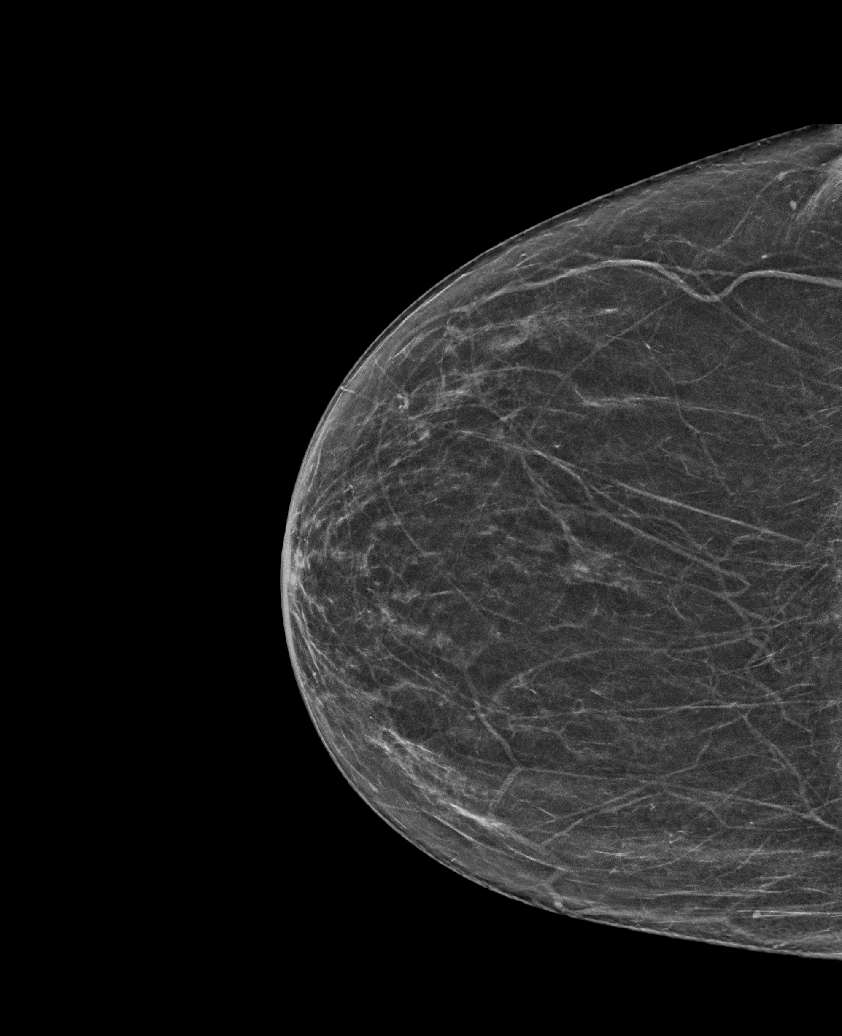

[R MLO synth-2D]
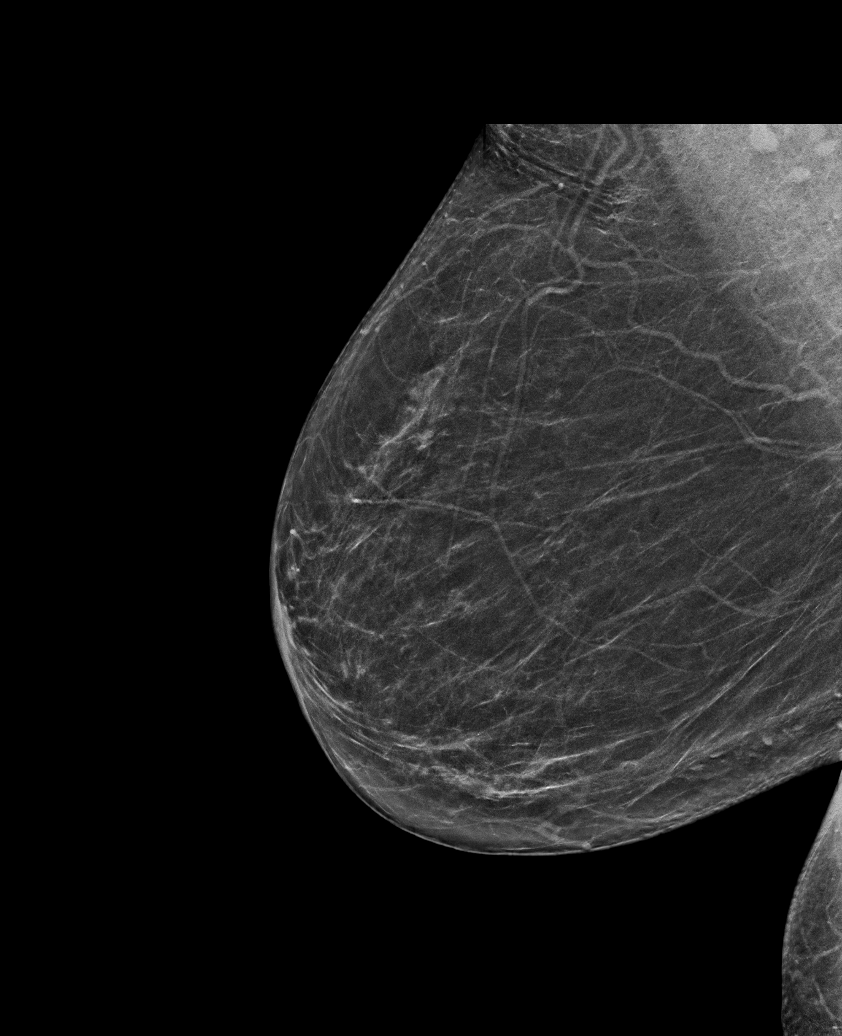

[R CC synth-2D (2 of 2)]
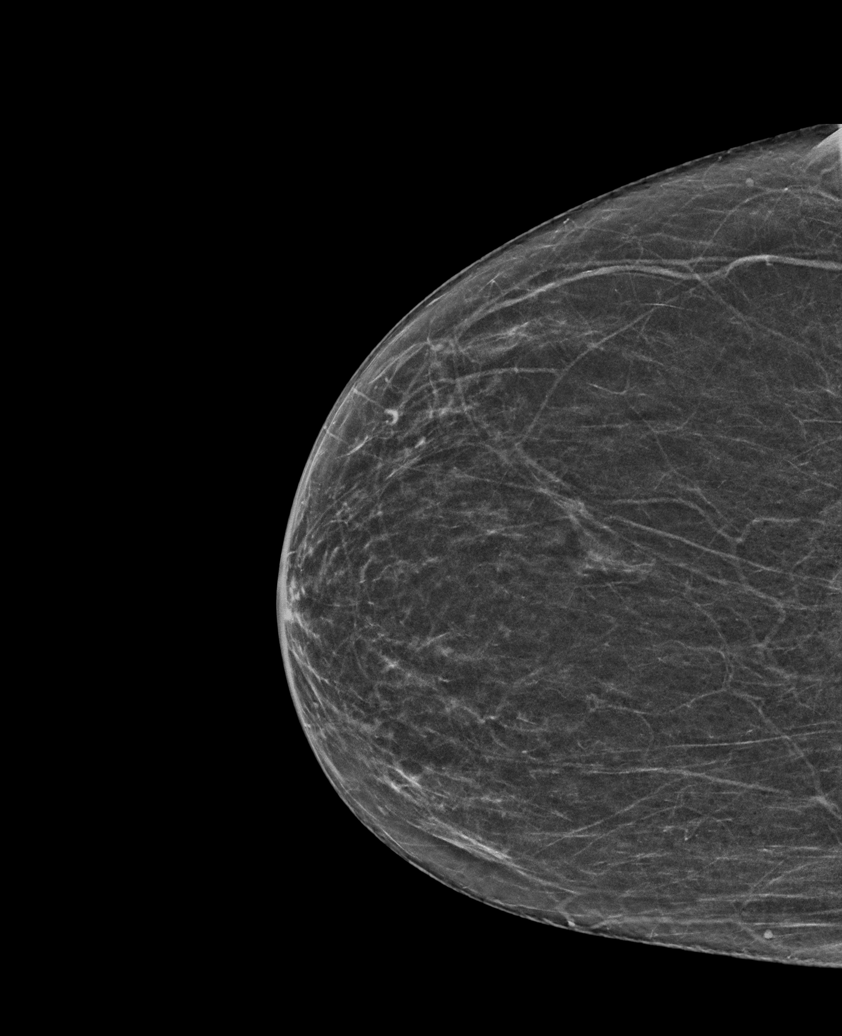

[L MLO synth-2D (1 of 2)]
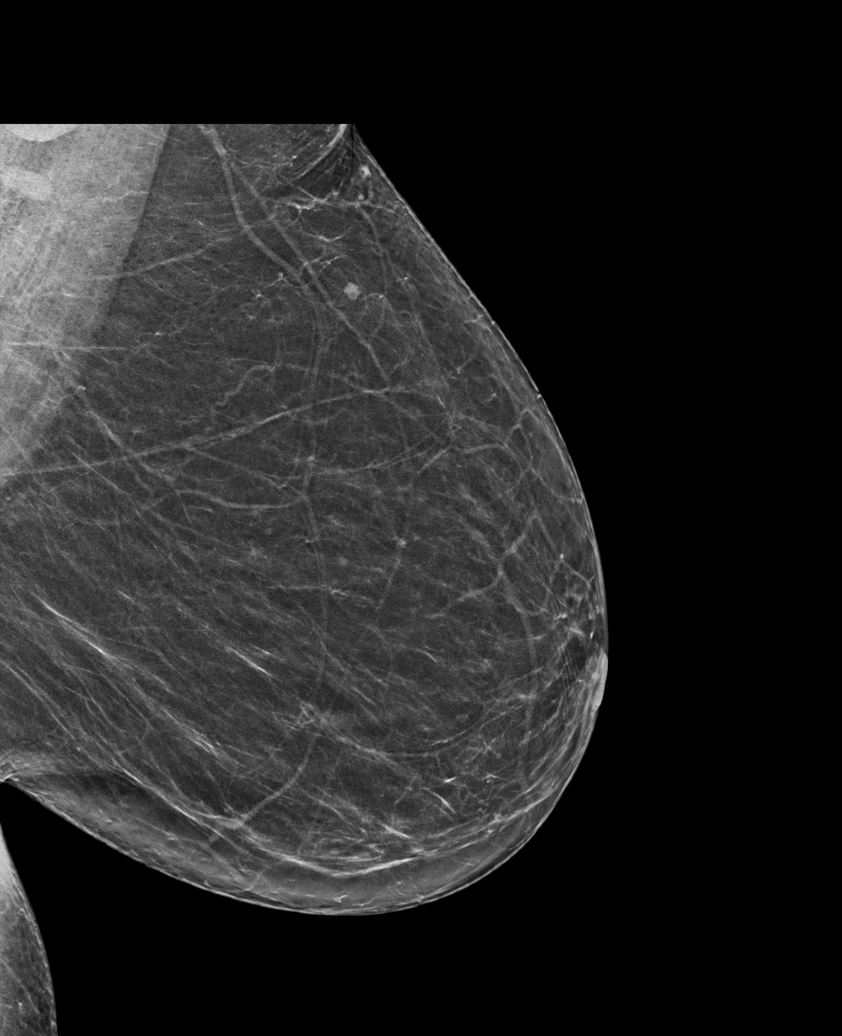

[L CC synth-2D]
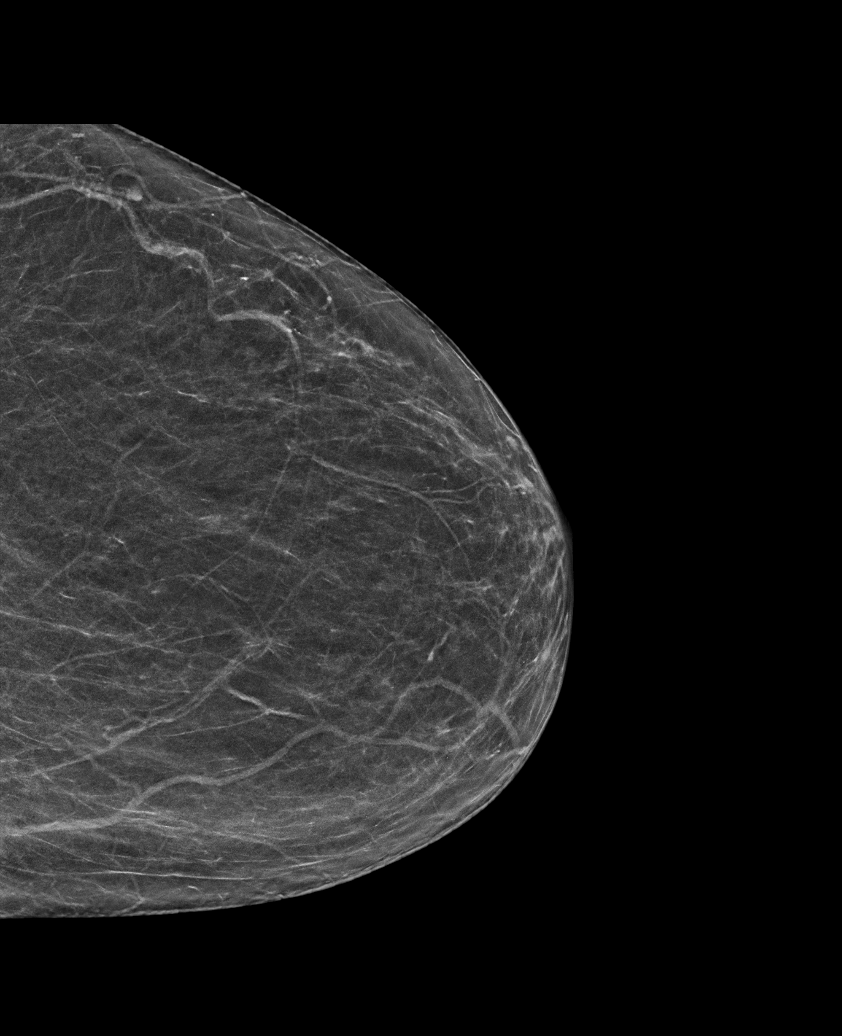

[L MLO synth-2D (2 of 2)]
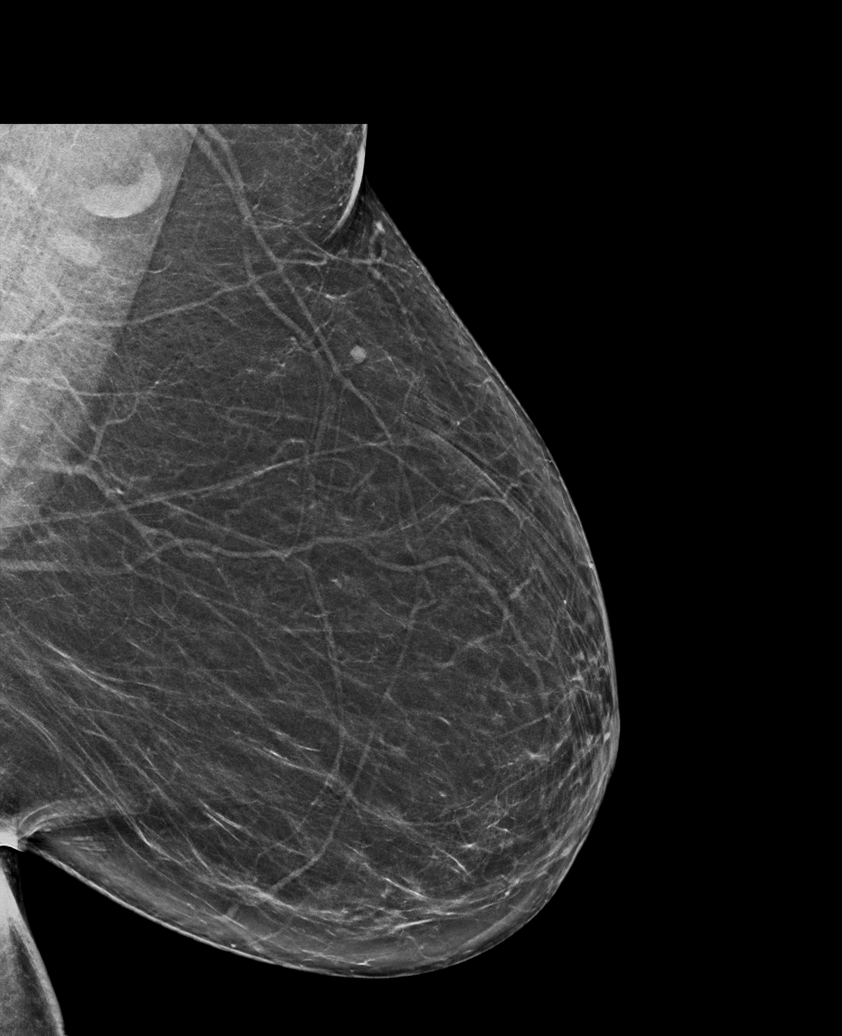

[6 of 36 positions shown; findings below may reference images not displayed]

ACR Breast Density Category b: There are scattered areas of
fibroglandular density.
FINDINGS: There are no findings suspicious for malignancy. Images were
processed with CAD.
IMPRESSION: No mammographic evidence of malignancy. A result letter of this
screening mammogram will be mailed directly to the patient.

RECOMMENDATION:
Screening mammogram in one year. (Code:[id number])

BI-RADS CATEGORY  1: Negative.

## 2020-06-26 DIAGNOSIS — Z720 Tobacco use: Secondary | ICD-10-CM | POA: Diagnosis not present

## 2020-06-26 DIAGNOSIS — E039 Hypothyroidism, unspecified: Secondary | ICD-10-CM | POA: Diagnosis not present

## 2020-06-26 DIAGNOSIS — Z87898 Personal history of other specified conditions: Secondary | ICD-10-CM | POA: Diagnosis not present

## 2020-06-26 DIAGNOSIS — M1712 Unilateral primary osteoarthritis, left knee: Secondary | ICD-10-CM | POA: Diagnosis not present

## 2020-06-26 DIAGNOSIS — Z0001 Encounter for general adult medical examination with abnormal findings: Secondary | ICD-10-CM | POA: Diagnosis not present

## 2020-06-26 DIAGNOSIS — G5139 Clonic hemifacial spasm, unspecified: Secondary | ICD-10-CM | POA: Diagnosis not present

## 2020-06-26 DIAGNOSIS — E119 Type 2 diabetes mellitus without complications: Secondary | ICD-10-CM | POA: Diagnosis not present

## 2020-06-26 DIAGNOSIS — I10 Essential (primary) hypertension: Secondary | ICD-10-CM | POA: Diagnosis not present

## 2020-06-26 DIAGNOSIS — E7211 Homocystinuria: Secondary | ICD-10-CM | POA: Diagnosis not present

## 2020-06-30 DIAGNOSIS — H35363 Drusen (degenerative) of macula, bilateral: Secondary | ICD-10-CM | POA: Diagnosis not present

## 2020-07-20 DIAGNOSIS — H5213 Myopia, bilateral: Secondary | ICD-10-CM | POA: Diagnosis not present

## 2020-09-22 DIAGNOSIS — I10 Essential (primary) hypertension: Secondary | ICD-10-CM | POA: Diagnosis not present

## 2020-09-22 DIAGNOSIS — G5139 Clonic hemifacial spasm, unspecified: Secondary | ICD-10-CM | POA: Diagnosis not present

## 2020-09-22 DIAGNOSIS — Z87898 Personal history of other specified conditions: Secondary | ICD-10-CM | POA: Diagnosis not present

## 2020-09-22 DIAGNOSIS — E7211 Homocystinuria: Secondary | ICD-10-CM | POA: Diagnosis not present

## 2020-09-22 DIAGNOSIS — M1712 Unilateral primary osteoarthritis, left knee: Secondary | ICD-10-CM | POA: Diagnosis not present

## 2020-09-22 DIAGNOSIS — E039 Hypothyroidism, unspecified: Secondary | ICD-10-CM | POA: Diagnosis not present

## 2020-09-22 DIAGNOSIS — Z720 Tobacco use: Secondary | ICD-10-CM | POA: Diagnosis not present

## 2020-09-22 DIAGNOSIS — E119 Type 2 diabetes mellitus without complications: Secondary | ICD-10-CM | POA: Diagnosis not present

## 2020-10-02 DIAGNOSIS — H524 Presbyopia: Secondary | ICD-10-CM | POA: Diagnosis not present

## 2020-12-20 DIAGNOSIS — E039 Hypothyroidism, unspecified: Secondary | ICD-10-CM | POA: Diagnosis not present

## 2020-12-20 DIAGNOSIS — I10 Essential (primary) hypertension: Secondary | ICD-10-CM | POA: Diagnosis not present

## 2020-12-20 DIAGNOSIS — E7211 Homocystinuria: Secondary | ICD-10-CM | POA: Diagnosis not present

## 2020-12-20 DIAGNOSIS — Z720 Tobacco use: Secondary | ICD-10-CM | POA: Diagnosis not present

## 2020-12-20 DIAGNOSIS — G5139 Clonic hemifacial spasm, unspecified: Secondary | ICD-10-CM | POA: Diagnosis not present

## 2020-12-20 DIAGNOSIS — M1712 Unilateral primary osteoarthritis, left knee: Secondary | ICD-10-CM | POA: Diagnosis not present

## 2020-12-20 DIAGNOSIS — Z87898 Personal history of other specified conditions: Secondary | ICD-10-CM | POA: Diagnosis not present

## 2020-12-20 DIAGNOSIS — E119 Type 2 diabetes mellitus without complications: Secondary | ICD-10-CM | POA: Diagnosis not present

## 2021-02-13 ENCOUNTER — Other Ambulatory Visit: Payer: Self-pay | Admitting: Physician Assistant

## 2021-02-13 DIAGNOSIS — Z1231 Encounter for screening mammogram for malignant neoplasm of breast: Secondary | ICD-10-CM

## 2021-03-13 ENCOUNTER — Other Ambulatory Visit: Payer: Self-pay

## 2021-03-13 ENCOUNTER — Ambulatory Visit
Admission: RE | Admit: 2021-03-13 | Discharge: 2021-03-13 | Disposition: A | Discharge status: Home / Self Care | Payer: Medicare Other | Source: Ambulatory Visit | Attending: Physician Assistant | Admitting: Physician Assistant

## 2021-03-13 DIAGNOSIS — Z1231 Encounter for screening mammogram for malignant neoplasm of breast: Secondary | ICD-10-CM | POA: Diagnosis not present

## 2021-03-14 DIAGNOSIS — M1712 Unilateral primary osteoarthritis, left knee: Secondary | ICD-10-CM | POA: Diagnosis not present

## 2021-03-14 DIAGNOSIS — Z720 Tobacco use: Secondary | ICD-10-CM | POA: Diagnosis not present

## 2021-03-14 DIAGNOSIS — Z87898 Personal history of other specified conditions: Secondary | ICD-10-CM | POA: Diagnosis not present

## 2021-03-14 DIAGNOSIS — I10 Essential (primary) hypertension: Secondary | ICD-10-CM | POA: Diagnosis not present

## 2021-03-14 DIAGNOSIS — Z0000 Encounter for general adult medical examination without abnormal findings: Secondary | ICD-10-CM | POA: Diagnosis not present

## 2021-03-14 DIAGNOSIS — E119 Type 2 diabetes mellitus without complications: Secondary | ICD-10-CM | POA: Diagnosis not present

## 2021-03-14 DIAGNOSIS — G5139 Clonic hemifacial spasm, unspecified: Secondary | ICD-10-CM | POA: Diagnosis not present

## 2021-03-14 DIAGNOSIS — E7211 Homocystinuria: Secondary | ICD-10-CM | POA: Diagnosis not present

## 2021-03-14 DIAGNOSIS — E039 Hypothyroidism, unspecified: Secondary | ICD-10-CM | POA: Diagnosis not present

## 2021-04-18 ENCOUNTER — Other Ambulatory Visit: Payer: Self-pay

## 2021-04-18 ENCOUNTER — Encounter (HOSPITAL_COMMUNITY): Payer: Self-pay | Admitting: Emergency Medicine

## 2021-04-18 ENCOUNTER — Observation Stay (HOSPITAL_COMMUNITY)
Admission: EM | Admit: 2021-04-18 | Discharge: 2021-04-21 | Disposition: A | Discharge status: Home / Self Care | Payer: Medicare Other | Attending: Emergency Medicine | Admitting: Emergency Medicine

## 2021-04-18 DIAGNOSIS — Z79899 Other long term (current) drug therapy: Secondary | ICD-10-CM | POA: Insufficient documentation

## 2021-04-18 DIAGNOSIS — R262 Difficulty in walking, not elsewhere classified: Secondary | ICD-10-CM | POA: Diagnosis not present

## 2021-04-18 DIAGNOSIS — Z85038 Personal history of other malignant neoplasm of large intestine: Secondary | ICD-10-CM | POA: Diagnosis not present

## 2021-04-18 DIAGNOSIS — E1165 Type 2 diabetes mellitus with hyperglycemia: Secondary | ICD-10-CM

## 2021-04-18 DIAGNOSIS — Z20822 Contact with and (suspected) exposure to covid-19: Secondary | ICD-10-CM | POA: Insufficient documentation

## 2021-04-18 DIAGNOSIS — E039 Hypothyroidism, unspecified: Secondary | ICD-10-CM | POA: Insufficient documentation

## 2021-04-18 DIAGNOSIS — E876 Hypokalemia: Secondary | ICD-10-CM | POA: Insufficient documentation

## 2021-04-18 DIAGNOSIS — F17210 Nicotine dependence, cigarettes, uncomplicated: Secondary | ICD-10-CM | POA: Diagnosis not present

## 2021-04-18 DIAGNOSIS — K922 Gastrointestinal hemorrhage, unspecified: Secondary | ICD-10-CM | POA: Diagnosis not present

## 2021-04-18 DIAGNOSIS — I10 Essential (primary) hypertension: Secondary | ICD-10-CM | POA: Diagnosis not present

## 2021-04-18 DIAGNOSIS — Z7984 Long term (current) use of oral hypoglycemic drugs: Secondary | ICD-10-CM | POA: Insufficient documentation

## 2021-04-18 DIAGNOSIS — Z794 Long term (current) use of insulin: Secondary | ICD-10-CM | POA: Insufficient documentation

## 2021-04-18 DIAGNOSIS — R195 Other fecal abnormalities: Secondary | ICD-10-CM | POA: Diagnosis present

## 2021-04-18 DIAGNOSIS — E119 Type 2 diabetes mellitus without complications: Secondary | ICD-10-CM | POA: Diagnosis not present

## 2021-04-18 LAB — COMPREHENSIVE METABOLIC PANEL
ALT: 19 U/L (ref 0–44)
AST: 21 U/L (ref 15–41)
Albumin: 3.8 g/dL (ref 3.5–5.0)
Alkaline Phosphatase: 61 U/L (ref 38–126)
Anion gap: 10 (ref 5–15)
BUN: 8 mg/dL (ref 8–23)
CO2: 25 mmol/L (ref 22–32)
Calcium: 9.3 mg/dL (ref 8.9–10.3)
Chloride: 97 mmol/L — ABNORMAL LOW (ref 98–111)
Creatinine, Ser: 1.02 mg/dL — ABNORMAL HIGH (ref 0.44–1.00)
GFR, Estimated: 56 mL/min — ABNORMAL LOW (ref >60)
Glucose, Bld: 265 mg/dL — ABNORMAL HIGH (ref 70–99)
Potassium: 3.3 mmol/L — ABNORMAL LOW (ref 3.5–5.1)
Sodium: 132 mmol/L — ABNORMAL LOW (ref 135–145)
Total Bilirubin: 0.9 mg/dL (ref 0.3–1.2)
Total Protein: 7.7 g/dL (ref 6.5–8.1)

## 2021-04-18 LAB — CBC
HCT: 39.9 % (ref 36.0–46.0)
Hemoglobin: 12.7 g/dL (ref 12.0–15.0)
MCH: 29.5 pg (ref 26.0–34.0)
MCHC: 31.8 g/dL (ref 30.0–36.0)
MCV: 92.8 fL (ref 80.0–100.0)
Platelets: 181 10*3/uL (ref 150–400)
RBC: 4.3 MIL/uL (ref 3.87–5.11)
RDW: 11.3 % — ABNORMAL LOW (ref 11.5–15.5)
WBC: 5 10*3/uL (ref 4.0–10.5)
nRBC: 0 % (ref 0.0–0.2)

## 2021-04-18 LAB — TYPE AND SCREEN
ABO/RH(D): O POS
Antibody Screen: NEGATIVE

## 2021-04-18 LAB — POC OCCULT BLOOD, ED: Fecal Occult Bld: POSITIVE — AB

## 2021-04-18 LAB — PROTIME-INR
INR: 1 (ref 0.8–1.2)
Prothrombin Time: 13 seconds (ref 11.4–15.2)

## 2021-04-18 LAB — RESP PANEL BY RT-PCR (FLU A&B, COVID) ARPGX2
Influenza A by PCR: NEGATIVE
Influenza B by PCR: NEGATIVE
SARS Coronavirus 2 by RT PCR: NEGATIVE

## 2021-04-18 MED ORDER — LISINOPRIL 20 MG PO TABS
20.0000 mg | ORAL_TABLET | Freq: Once | ORAL | Status: AC
  Administered 2021-04-18: 21:00:00 20 mg via ORAL
  Filled 2021-04-18: qty 1

## 2021-04-18 MED ORDER — CLONIDINE HCL 0.1 MG PO TABS
0.1000 mg | ORAL_TABLET | Freq: Once | ORAL | Status: AC
  Administered 2021-04-18: 21:00:00 0.1 mg via ORAL
  Filled 2021-04-18: qty 1

## 2021-04-18 NOTE — ED Triage Notes (Signed)
Pt here POV with c/o of bloody stool. Began 12/21. Recently had dentures placed and now blood in stool. Dark red with clots. Denies pain.

## 2021-04-18 NOTE — H&P (Signed)
History and Physical    Heather Wilcox [id number]:[id number] DOB: 06-25-1940 DOA: 04/18/2021  PCP: Benito Mccreedy, MD  Patient coming from: Home  I have personally briefly reviewed patient's old medical records in [place]  Chief Complaint: Blood in stool  HPI: Heather Wilcox is a 80 y.o. female with medical history significant for hypertension, hypothyroidism, colon cancer s/p partial right colectomy who presented to the ED for evaluation of rectal bleeding.  Patient reports new onset of painless rectal bleeding beginning afternoon prior to admission.  She initially felt like she was having gas and went to use the toilet.  She saw bright red blood from her rectum with clots.  She denied any rectal or abdominal pain.  She denies any other obvious bleeding.  She is not taking any blood thinners or antiplatelets.  She has not had any lightheadedness, dizziness, chest pain, dyspnea, dysuria.   ED Course:  Initial vitals showed BP 152/102, pulse 103, RR 16, temp 98.3 F, SPO2 100% on room air.  Labs show sodium 132, potassium 3.3, bicarb 25, BUN 8, creatinine 1.02, serum glucose 265, LFTs within normal limits, WBC 5.0, hemoglobin 12.7, platelets 181,000, INR 1.0.  FOBT is positive.  Respiratory panel in process.  EDP discussed with on-call GI who recommended clear liquid diet, medical admission, they will consult in a.m.  The hospitalist service was consulted to admit for further evaluation and management.  Review of Systems: All systems reviewed and are negative except as documented in history of present illness above.   Past Medical History:  Diagnosis Date   Arthritis    "left knee" (10/24/2016)   Colon cancer ([place])    ADENOMA OF COLON AT HEPATIC FLEXURE/notes 10/24/2016   History of stomach ulcers    Hypertension    Hypothyroidism     Past Surgical History:  Procedure Laterality Date   COLON SURGERY     COLONOSCOPY     CRANIOTOMY FOR TUMOR Left 04/2004    temporal  craniotomy for meningioma with microdissection/notes 09/11/2010   INCISION AND DRAINAGE PERIRECTAL ABSCESS N/A 06/13/2016   Procedure: IRRIGATION AND DEBRIDEMENT PERIRECTAL ABSCESS;  Surgeon: Autumn Messing III, MD;  Location: [facility];  Service: General;  Laterality: N/A;   LAPAROSCOPIC PARTIAL RIGHT COLECTOMY Right 10/24/2016   LAPAROSCOPIC RIGHT COLECTOMY Right 10/24/2016   Procedure: LAPAROSCOPIC ASSISTED RIGHT COLECTOMY;  Surgeon: Jovita Kussmaul, MD;  Location: [facility];  Service: General;  Laterality: Right;   REPAIR OF PERFORATED ULCER     "bleeding stomach ulcer"   TOTAL THYROIDECTOMY      Social History:  reports that she has been smoking cigarettes. She has a 5.80 pack-year smoking history. She has never used smokeless tobacco. She reports that she does not drink alcohol and does not use drugs.  Allergies  Allergen Reactions   Penicillin G Other (See Comments)    Other reaction(s): Unknown    Family History  Problem Relation Age of Onset   Breast cancer Neg Hx      Prior to Admission medications   Medication Sig Start Date End Date Taking? Authorizing Provider  cholecalciferol (VITAMIN D) 1000 units tablet Take 1,000 Units by mouth daily.    [provider]  cloNIDine (CATAPRES) 0.1 MG tablet Take 0.1 mg by mouth at bedtime.    [provider]  diphenhydramine-acetaminophen (TYLENOL PM) 25-500 MG TABS tablet Take 1-2 tablets by mouth daily as needed (for pain/sleep).     [provider]  folic acid (FOLVITE)  400 MCG tablet Take 400 mcg by mouth daily.    [provider]  levofloxacin (LEVAQUIN) 500 MG tablet Take 1 tablet (500 mg total) by mouth daily. X 3 days 06/02/16   Rai, Vernelle Emerald, MD  levothyroxine (SYNTHROID, LEVOTHROID) 75 MCG tablet Take 75 mcg by mouth daily before breakfast.    [provider]  lisinopril-hydrochlorothiazide (PRINZIDE,ZESTORETIC) 20-25 MG tablet Take 2 tablets by mouth daily. HOLD until PCP follow-up Patient  taking differently: Take 2 tablets by mouth daily.  06/01/16   Rai, Vernelle Emerald, MD  metoprolol tartrate (LOPRESSOR) 100 MG tablet Take 100 mg by mouth daily.    [provider]  oseltamivir (TAMIFLU) 75 MG capsule Take 1 capsule (75 mg total) by mouth 2 (two) times daily. X 3 more days 06/02/16   Mendel Corning, MD    Physical Exam: Vitals:   04/18/21 1534 04/18/21 1536 04/18/21 1842 04/18/21 2037  BP:  (!) 184/106 (!) 152/102 (!) 197/112  Pulse:  (!) 114 (!) 103 98  Resp:   16 18  Temp:  98.5 F (36.9 C) 98.3 F (36.8 C)   TempSrc:  Oral Oral   SpO2:  99% 100% 97%  Weight: 86.2 kg     Height: 5\' 7"  (1.702 m)      Constitutional: NAD, calm, comfortable Eyes: PERRL, lids and conjunctivae normal ENMT: Mucous membranes are moist. Posterior pharynx clear of any exudate or lesions.Normal dentition.  Neck: normal, supple, no masses. Respiratory: clear to auscultation bilaterally, no wheezing, no crackles. Normal respiratory effort. No accessory muscle use.  Cardiovascular: Regular rate and rhythm, no murmurs / rubs / gallops. No extremity edema. 2+ pedal pulses. Abdomen: no tenderness, no masses palpated. No hepatosplenomegaly. Bowel sounds positive.  Musculoskeletal: no clubbing / cyanosis. No joint deformity upper and lower extremities. Good ROM, no contractures. Normal muscle tone.  Skin: no rashes, lesions, ulcers. No induration Neurologic: CN 2-12 grossly intact. Sensation intact. Strength 5/5 in all 4.  Psychiatric: Normal judgment and insight. Alert and oriented x 3. Normal mood.   Labs on Admission: I have personally reviewed following labs and imaging studies  CBC: Recent Labs  Lab 04/18/21 1546  WBC 5.0  HGB 12.7  HCT 39.9  MCV 92.8  PLT 378   Basic Metabolic Panel: Recent Labs  Lab 04/18/21 1546  NA 132*  K 3.3*  CL 97*  CO2 25  GLUCOSE 265*  BUN 8  CREATININE 1.02*  CALCIUM 9.3   GFR: Estimated Creatinine Clearance: 50.4 mL/min (A) (by C-G formula  based on SCr of 1.02 mg/dL (H)). Liver Function Tests: Recent Labs  Lab 04/18/21 1546  AST 21  ALT 19  ALKPHOS 61  BILITOT 0.9  PROT 7.7  ALBUMIN 3.8   No results for input(s): LIPASE, AMYLASE in the last 168 hours. No results for input(s): AMMONIA in the last 168 hours. Coagulation Profile: Recent Labs  Lab 04/18/21 2118  INR 1.0   Cardiac Enzymes: No results for input(s): CKTOTAL, CKMB, CKMBINDEX, TROPONINI in the last 168 hours. BNP (last 3 results) No results for input(s): PROBNP in the last 8760 hours. HbA1C: No results for input(s): HGBA1C in the last 72 hours. CBG: No results for input(s): GLUCAP in the last 168 hours. Lipid Profile: No results for input(s): CHOL, HDL, LDLCALC, TRIG, CHOLHDL, LDLDIRECT in the last 72 hours. Thyroid Function Tests: No results for input(s): TSH, T4TOTAL, FREET4, T3FREE, THYROIDAB in the last 72 hours. Anemia Panel: No results for input(s): VITAMINB12, FOLATE,  FERRITIN, TIBC, IRON, RETICCTPCT in the last 72 hours. Urine analysis:    Component Value Date/Time   COLORURINE AMBER (A) 06/13/2016 1626   APPEARANCEUR CLEAR 06/13/2016 1626   LABSPEC 1.018 06/13/2016 1626   PHURINE 6.0 06/13/2016 1626   GLUCOSEU NEGATIVE 06/13/2016 1626   HGBUR SMALL (A) 06/13/2016 1626   BILIRUBINUR NEGATIVE 06/13/2016 1626   KETONESUR 5 (A) 06/13/2016 1626   PROTEINUR NEGATIVE 06/13/2016 1626   NITRITE NEGATIVE 06/13/2016 1626   LEUKOCYTESUR NEGATIVE 06/13/2016 1626    Radiological Exams on Admission: No results found.  EKG: Not performed.  Assessment/Plan Principal Problem:   Acute lower GI bleeding Active Problems:   Hypothyroidism   Hypertension   Heather Wilcox is a 80 y.o. female with medical history significant for hypertension, hypothyroidism, colon cancer s/p partial right colectomy who is admitted with acute lower GI bleeding.  Acute lower GI bleeding: Patient presenting with acute painless rectal bleeding.  Hemoglobin  currently stable.  Follows with [facility], Dr. Michail Sermon, who will consult in AM.  Placed on clear liquid diet for now.  Hypertension: Continue home clonidine, hydralazine, lisinopril.  Holding HCTZ with hypokalemia.  Hypokalemia: IV supplement ordered.  Hypothyroidism: Continue Synthroid.  History of colon cancer s/p partial right colectomy: GI to consult as above.  DVT prophylaxis: SCDs Code Status: Full code, confirmed on admission Family Communication: Discussed with patient, she has discussed with family Disposition Plan: From home and likely discharge to home pending clinical progress Consults called: Gastroenterology Level of care: Med-Surg Admission status:  Status is: Observation  The patient remains OBS appropriate and will d/c before 2 midnights.  Zada Finders MD [facility]  If 7PM-7AM, please contact night-coverage [email or web address]  04/19/2021, 12:11 AM

## 2021-04-18 NOTE — ED Provider Notes (Signed)
Emergency Medicine Provider Triage Evaluation Note  Heather Wilcox , a 80 y.o. female  was evaluated in triage.  Pt complains of red blood noted in the stool starting today.  Patient recently had dentures fitted but denies bleeding from the mouth.  No lightheadedness or syncope.  No chest pain or abdominal pain.  She is not on anticoagulation does not take aspirin.  Denies history of GI bleeding.  Review of Systems  Positive: Blood in stool Negative: Abdominal pain  Physical Exam  BP (!) 184/106 (BP Location: Right Arm)    Pulse (!) 114    Temp 98.5 F (36.9 C) (Oral)    Ht 5\' 7"  (1.702 m)    Wt 86.2 kg    SpO2 99%    BMI 29.76 kg/m  Gen:   Awake, no distress   Resp:  Normal effort  MSK:   Moves extremities without difficulty  Other:  Abdomen is soft and nontender  Medical Decision Making  Medically screening exam initiated at 3:44 PM.  Appropriate orders placed.  MAANYA HIPPERT was informed that the remainder of the evaluation will be completed by another provider, this initial triage assessment does not replace that evaluation, and the importance of remaining in the ED until their evaluation is complete.     Carlisle Cater, PA-C 04/18/21 1546    Luna Fuse, MD 04/18/21 2118

## 2021-04-19 ENCOUNTER — Encounter (HOSPITAL_COMMUNITY): Payer: Self-pay | Admitting: Internal Medicine

## 2021-04-19 DIAGNOSIS — E039 Hypothyroidism, unspecified: Secondary | ICD-10-CM | POA: Diagnosis not present

## 2021-04-19 DIAGNOSIS — E538 Deficiency of other specified B group vitamins: Secondary | ICD-10-CM | POA: Diagnosis not present

## 2021-04-19 DIAGNOSIS — E1165 Type 2 diabetes mellitus with hyperglycemia: Secondary | ICD-10-CM | POA: Diagnosis not present

## 2021-04-19 DIAGNOSIS — K921 Melena: Secondary | ICD-10-CM | POA: Diagnosis not present

## 2021-04-19 DIAGNOSIS — E876 Hypokalemia: Secondary | ICD-10-CM

## 2021-04-19 DIAGNOSIS — K922 Gastrointestinal hemorrhage, unspecified: Secondary | ICD-10-CM | POA: Diagnosis not present

## 2021-04-19 DIAGNOSIS — I10 Essential (primary) hypertension: Secondary | ICD-10-CM

## 2021-04-19 LAB — BASIC METABOLIC PANEL
Anion gap: 7 (ref 5–15)
BUN: 9 mg/dL (ref 8–23)
CO2: 26 mmol/L (ref 22–32)
Calcium: 8.9 mg/dL (ref 8.9–10.3)
Chloride: 100 mmol/L (ref 98–111)
Creatinine, Ser: 0.84 mg/dL (ref 0.44–1.00)
GFR, Estimated: >60 mL/min (ref >60)
Glucose, Bld: 210 mg/dL — ABNORMAL HIGH (ref 70–99)
Potassium: 4.1 mmol/L (ref 3.5–5.1)
Sodium: 133 mmol/L — ABNORMAL LOW (ref 135–145)

## 2021-04-19 LAB — CBC
HCT: 38 % (ref 36.0–46.0)
Hemoglobin: 12.1 g/dL (ref 12.0–15.0)
MCH: 29.4 pg (ref 26.0–34.0)
MCHC: 31.8 g/dL (ref 30.0–36.0)
MCV: 92.2 fL (ref 80.0–100.0)
Platelets: 184 10*3/uL (ref 150–400)
RBC: 4.12 MIL/uL (ref 3.87–5.11)
RDW: 11.5 % (ref 11.5–15.5)
WBC: 6 10*3/uL (ref 4.0–10.5)
nRBC: 0 % (ref 0.0–0.2)

## 2021-04-19 LAB — ABO/RH: ABO/RH(D): O POS

## 2021-04-19 LAB — CBG MONITORING, ED
Glucose-Capillary: 239 mg/dL — ABNORMAL HIGH (ref 70–99)
Glucose-Capillary: 204 mg/dL — ABNORMAL HIGH (ref 70–99)

## 2021-04-19 LAB — HEMOGLOBIN A1C
Hgb A1c MFr Bld: 10.8 % — ABNORMAL HIGH (ref 4.8–5.6)
Mean Plasma Glucose: 263.26 mg/dL

## 2021-04-19 LAB — GLUCOSE, CAPILLARY
Glucose-Capillary: 220 mg/dL — ABNORMAL HIGH (ref 70–99)
Glucose-Capillary: 196 mg/dL — ABNORMAL HIGH (ref 70–99)

## 2021-04-19 MED ORDER — HYDRALAZINE HCL 25 MG PO TABS
25.0000 mg | ORAL_TABLET | Freq: Two times a day (BID) | ORAL | Status: DC
  Administered 2021-04-19: 01:00:00 25 mg via ORAL
  Filled 2021-04-19: qty 1

## 2021-04-19 MED ORDER — HYDRALAZINE HCL 25 MG PO TABS
25.0000 mg | ORAL_TABLET | Freq: Three times a day (TID) | ORAL | Status: DC
  Administered 2021-04-19 – 2021-04-21 (×7): 25 mg via ORAL
  Filled 2021-04-19 (×7): qty 1

## 2021-04-19 MED ORDER — CLONIDINE HCL 0.1 MG PO TABS: 0.1000 mg | ORAL_TABLET | Freq: Every day | ORAL | Status: DC

## 2021-04-19 MED ORDER — INSULIN ASPART 100 UNIT/ML IJ SOLN
0.0000 [IU] | Freq: Three times a day (TID) | INTRAMUSCULAR | Status: DC
  Administered 2021-04-19 (×3): 3 [IU] via SUBCUTANEOUS
  Administered 2021-04-20: 09:00:00 2 [IU] via SUBCUTANEOUS
  Administered 2021-04-20: 13:00:00 3 [IU] via SUBCUTANEOUS
  Administered 2021-04-20 – 2021-04-21 (×3): 2 [IU] via SUBCUTANEOUS

## 2021-04-19 MED ORDER — ACETAMINOPHEN 325 MG PO TABS: 650.0000 mg | ORAL_TABLET | Freq: Four times a day (QID) | ORAL | Status: DC | PRN

## 2021-04-19 MED ORDER — ONDANSETRON HCL 4 MG PO TABS: 4.0000 mg | ORAL_TABLET | Freq: Four times a day (QID) | ORAL | Status: DC | PRN

## 2021-04-19 MED ORDER — POTASSIUM CHLORIDE 10 MEQ/100ML IV SOLN
10.0000 meq | INTRAVENOUS | Status: AC
  Administered 2021-04-19 (×3): 10 meq via INTRAVENOUS
  Filled 2021-04-19 (×3): qty 100

## 2021-04-19 MED ORDER — CLONIDINE HCL 0.1 MG PO TABS
0.1000 mg | ORAL_TABLET | Freq: Two times a day (BID) | ORAL | Status: DC
  Administered 2021-04-19 – 2021-04-21 (×5): 0.1 mg via ORAL
  Filled 2021-04-19 (×5): qty 1

## 2021-04-19 MED ORDER — LISINOPRIL 20 MG PO TABS
40.0000 mg | ORAL_TABLET | Freq: Every day | ORAL | Status: DC
  Administered 2021-04-19 – 2021-04-21 (×3): 40 mg via ORAL
  Filled 2021-04-19 (×3): qty 2

## 2021-04-19 MED ORDER — ATORVASTATIN CALCIUM 10 MG PO TABS
20.0000 mg | ORAL_TABLET | Freq: Every day | ORAL | Status: DC
  Administered 2021-04-19 – 2021-04-21 (×3): 20 mg via ORAL
  Filled 2021-04-19 (×3): qty 2

## 2021-04-19 MED ORDER — INSULIN ASPART 100 UNIT/ML IJ SOLN: 0.0000 [IU] | Freq: Every day | INTRAMUSCULAR | Status: DC

## 2021-04-19 MED ORDER — ONDANSETRON HCL 4 MG/2ML IJ SOLN: 4.0000 mg | Freq: Four times a day (QID) | INTRAMUSCULAR | Status: DC | PRN

## 2021-04-19 MED ORDER — ACETAMINOPHEN 650 MG RE SUPP: 650.0000 mg | Freq: Four times a day (QID) | RECTAL | Status: DC | PRN

## 2021-04-19 MED ORDER — INSULIN GLARGINE-YFGN 100 UNIT/ML ~~LOC~~ SOLN
10.0000 [IU] | Freq: Every day | SUBCUTANEOUS | Status: DC
  Administered 2021-04-19 – 2021-04-21 (×3): 10 [IU] via SUBCUTANEOUS
  Filled 2021-04-19 (×3): qty 0.1

## 2021-04-19 MED ORDER — LEVOTHYROXINE SODIUM 75 MCG PO TABS
75.0000 ug | ORAL_TABLET | Freq: Every day | ORAL | Status: DC
Start: 2021-04-19 — End: 2021-04-21
  Administered 2021-04-19 – 2021-04-21 (×3): 75 ug via ORAL
  Filled 2021-04-19 (×3): qty 1

## 2021-04-19 NOTE — Consult Note (Signed)
Referring Provider: Dr. Cyndia Skeeters Primary Care Physician:  Benito Mccreedy, MD Primary Gastroenterologist:  Dr. Michail Sermon  Reason for Consultation:  GI Bleed  HPI: Heather Wilcox is a 80 y.o. female who had the acute onset of rectal bleeding described as a large amount of dark red blood and clots that occurred 3 times without any visible stool and without any abdominal pain or rectal pain. Denies dizziness, lightheadedness, nausea/vomiting, heartburn. Denies NSAIDs. Rare alcohol use. Colonoscopy in 01/2019 were 3 tubular adenomas and 8 hyperplastic polyps were removed and showed sigmoid diverticulosis and medium internal hemorrhoids. S/P right hemicolectomy in 2018 due to a large adenomatous polyp (no cancer was seen on surgical path). Smaller amount of rectal bleeding this morning. Hgb 12.1.  Past Medical History:  Diagnosis Date   Arthritis    "left knee" (10/24/2016)       ADENOMA OF COLON AT HEPATIC FLEXURE/notes 10/24/2016   History of stomach ulcers    Hypertension    Hypothyroidism     Past Surgical History:  Procedure Laterality Date   COLON SURGERY     COLONOSCOPY     CRANIOTOMY FOR TUMOR Left 04/2004    temporal craniotomy for meningioma with microdissection/notes 09/11/2010   INCISION AND DRAINAGE PERIRECTAL ABSCESS N/A 06/13/2016   Procedure: IRRIGATION AND DEBRIDEMENT PERIRECTAL ABSCESS;  Surgeon: Autumn Messing III, MD;  Location: [facility];  Service: General;  Laterality: N/A;   LAPAROSCOPIC PARTIAL RIGHT COLECTOMY Right 10/24/2016   LAPAROSCOPIC RIGHT COLECTOMY Right 10/24/2016   Procedure: LAPAROSCOPIC ASSISTED RIGHT COLECTOMY;  Surgeon: Jovita Kussmaul, MD;  Location: [facility];  Service: General;  Laterality: Right;   REPAIR OF PERFORATED ULCER     "bleeding stomach ulcer"   TOTAL THYROIDECTOMY      Prior to Admission medications   Medication Sig Start Date End Date Taking? Authorizing Provider  cholecalciferol (VITAMIN D) 1000 units tablet Take 1,000 Units by mouth daily.   Yes  [provider]  cloNIDine (CATAPRES) 0.1 MG tablet Take 0.1 mg by mouth daily.   Yes [provider]  diphenhydramine-acetaminophen (TYLENOL PM) 25-500 MG TABS tablet Take 1-2 tablets by mouth daily as needed (for pain/sleep).    Yes [provider]  folic acid (FOLVITE) 341 MCG tablet Take 400 mcg by mouth daily.   Yes [provider]  hydrALAZINE (APRESOLINE) 25 MG tablet Take 25 mg by mouth 2 (two) times daily. 04/11/21  Yes [provider]  levothyroxine (SYNTHROID, LEVOTHROID) 75 MCG tablet Take 75 mcg by mouth daily before breakfast.   Yes [provider]  lisinopril-hydrochlorothiazide (PRINZIDE,ZESTORETIC) 20-25 MG tablet Take 2 tablets by mouth daily. HOLD until PCP follow-up Patient taking differently: Take 2 tablets by mouth daily. 06/01/16  Yes Rai, Ripudeep K, MD  meloxicam (MOBIC) 7.5 MG tablet Take 7.5 mg by mouth daily. 04/11/21  Yes [provider]    Scheduled Meds:  cloNIDine  0.1 mg Oral BID   hydrALAZINE  25 mg Oral Q8H   insulin aspart  0-5 Units Subcutaneous QHS   insulin aspart  0-9 Units Subcutaneous TID WC   levothyroxine  75 mcg Oral Q0600   lisinopril  40 mg Oral Daily   Continuous Infusions: PRN Meds:.acetaminophen **OR** acetaminophen, ondansetron **OR** ondansetron (ZOFRAN) IV  Allergies as of 04/18/2021 - Review Complete 04/18/2021  Allergen Reaction Noted   Penicillin g Other (See Comments) 03/14/2021    Family History  Problem Relation Age of Onset   Breast cancer Neg Hx  Social History   Socioeconomic History   Marital status: Widowed    Spouse name: Not on file   Number of children: Not on file   Years of education: Not on file   Highest education level: Not on file  Occupational History   Not on file  Tobacco Use   Smoking status: Some Days    Packs/day: 0.10    Years: 58.00    Pack years: 5.80    Types: Cigarettes   Smokeless tobacco: Never  Vaping Use   Vaping Use:  Never used  Substance and Sexual Activity   Alcohol use: No   Drug use: No   Sexual activity: Never  Other Topics Concern   Not on file  Social History Narrative   Not on file   Social Determinants of Health   Financial Resource Strain: Not on file  Food Insecurity: Not on file  Transportation Needs: Not on file  Physical Activity: Not on file  Stress: Not on file  Social Connections: Not on file  Intimate Partner Violence: Not on file    Review of Systems: All negative except as stated above in HPI.  Physical Exam: Vital signs: Vitals:   04/19/21 0735 04/19/21 0800  BP: (!) 183/103 (!) 158/95  Pulse: 72 72  Resp: 16 (!) 23  Temp: 98 F (36.7 C)   SpO2: 99% 97%     General:   Lethargic, elderly, Well-developed, well-nourished, pleasant and cooperative in NAD Head: normocephalic, atraumatic Eyes: anicteric sclera, left eyelid closed ENT: oropharynx clear Neck: supple, nontender Lungs:  Clear throughout to auscultation.   No wheezes, crackles, or rhonchi. No acute distress. Heart:  Regular rate and rhythm; no murmurs, clicks, rubs,  or gallops. Abdomen: soft, nontender, nondistended, +BS  Rectal:  Deferred Ext: no edema  GI:  Lab Results: Recent Labs    04/18/21 1546 04/19/21 0440  WBC 5.0 6.0  HGB 12.7 12.1  HCT 39.9 38.0  PLT 181 184   BMET Recent Labs    04/18/21 1546 04/19/21 0440  NA 132* 133*  K 3.3* 4.1  CL 97* 100  CO2 25 26  GLUCOSE 265* 210*  BUN 8 9  CREATININE 1.02* 0.84  CALCIUM 9.3 8.9   LFT Recent Labs    04/18/21 1546  PROT 7.7  ALBUMIN 3.8  AST 21  ALT 19  ALKPHOS 61  BILITOT 0.9   PT/INR Recent Labs    04/18/21 2118  LABPROT 13.0  INR 1.0     Studies/Results: No results found.  Impression/Plan: Painless hematochezia likely due to a diverticular source. Colonoscopy in 2020 as stated above and due for surveillance in Oct 2023. Manage conservatively and hold off on CT angiogram unless bleeding worsens or  hemodynamics change. I do not think a repeat colonoscopy is needed this admission unless bleeding worsens and persists. She wants to go home but I would recommend admit/observation overnight to medicine service and if bleeding resolves home tomorrow. Clear liquid diet. Will follow.    LOS: 0 days   Lear Ng  04/19/2021, 9:36 AM  Questions please call [phone]

## 2021-04-19 NOTE — Progress Notes (Signed)
PROGRESS NOTE  Heather Wilcox KVQ:[id number] DOB: 1940/06/13   PCP: Benito Mccreedy, MD  Patient is from: Home  DOA: 04/18/2021 LOS: 0  Chief complaints:  Chief Complaint  Patient presents with   Blood In Stools     Brief Narrative / Interim history: 80 year old F with PMH of colon cancer s/p partial right colectomy, hypertension and hypothyroidism presenting with painless hematochezia the day of admission, and admitted for the same.  Hemodynamically stable.  FOBT positive.  Hgb 12.7.  GI consulted.   Subjective: Seen and examined earlier this morning.  No further hematochezia or melena.  Denies nausea, vomiting or abdominal pain.  She denies dizziness, palpitation, chest pain or dyspnea.  Objective: Vitals:   04/19/21 0735 04/19/21 0800 04/19/21 1052 04/19/21 1300  BP: (!) 183/103 (!) 158/95 131/71 (!) 158/110  Pulse: 72 72 78 65  Resp: 16 (!) 23 20 (!) 22  Temp: 98 F (36.7 C)     TempSrc: Oral     SpO2: 99% 97% 98% 98%  Weight:      Height:        Examination:  GENERAL: No apparent distress.  Nontoxic. HEENT: MMM.  Vision and hearing grossly intact.  NECK: Supple.  No apparent JVD.  RESP: 98% on RA.  No IWOB.  Fair aeration bilaterally. CVS:  RRR. Heart sounds normal.  ABD/GI/GU: BS+. Abd soft, NTND.  MSK/EXT:  Moves extremities. No apparent deformity. No edema.  SKIN: no apparent skin lesion or wound NEURO: Awake, alert and oriented appropriately.  No apparent focal neuro deficit. PSYCH: Calm. Normal affect.   Procedures:  None  Microbiology summarized: [id number] and influenza PCR nonreactive.  Assessment & Plan: Acute lower GI bleeding: Likely diverticular.  H&H relatively stable. Recent Labs    04/18/21 1546 04/19/21 0440  HGB 12.7 12.1  -Continue monitoring -Check anemia panel -Continue clear liquid diet per GI  Uncontrolled hypertension: Probably due to noncompliance with meds.  She says she was out of a hydralazine for about a week, and  has been doubling on clonidine.  -Change clonidine to twice daily -Change hydralazine to 3 times daily -Continue lisinopril -Further adjustment as appropriate  Hypokalemia: Resolved.  New diagnosis of DM-2: A1c 10.8%.  Not on meds at home. Recent Labs  Lab 04/19/21 0737 04/19/21 1231  GLUCAP 239* 204*  -Start Lantus 10 units daily -Start SSI-sensitive -Atorvastatin 20 mg daily  Hypothyroidism -Continue home Synthroid  History of colon cancer s/p right partial colectomy   Body mass index is 29.76 kg/m.         DVT prophylaxis:  SCDs Start: 04/19/21 0006  Code Status: Full code Family Communication: Patient and/or RN. Available if any question.  Level of care: Med-Surg Status is: Observation  The patient remains OBS appropriate and will d/c before 2 midnights.      Consultants:  Gastroenterology   Sch Meds:  Scheduled Meds:  atorvastatin  20 mg Oral Daily   cloNIDine  0.1 mg Oral BID   hydrALAZINE  25 mg Oral Q8H   insulin aspart  0-5 Units Subcutaneous QHS   insulin aspart  0-9 Units Subcutaneous TID WC   insulin glargine-yfgn  10 Units Subcutaneous Daily   levothyroxine  75 mcg Oral Q0600   lisinopril  40 mg Oral Daily   Continuous Infusions: PRN Meds:.acetaminophen **OR** acetaminophen, ondansetron **OR** ondansetron (ZOFRAN) IV  Antimicrobials: Anti-infectives (From admission, onward)    None        I have personally reviewed the following  labs and images: CBC: Recent Labs  Lab 04/18/21 1546 04/19/21 0440  WBC 5.0 6.0  HGB 12.7 12.1  HCT 39.9 38.0  MCV 92.8 92.2  PLT 181 184   BMP &GFR Recent Labs  Lab 04/18/21 1546 04/19/21 0440  NA 132* 133*  K 3.3* 4.1  CL 97* 100  CO2 25 26  GLUCOSE 265* 210*  BUN 8 9  CREATININE 1.02* 0.84  CALCIUM 9.3 8.9   Estimated Creatinine Clearance: 61.2 mL/min (by C-G formula based on SCr of 0.84 mg/dL). Liver & Pancreas: Recent Labs  Lab 04/18/21 1546  AST 21  ALT 19  ALKPHOS 61   BILITOT 0.9  PROT 7.7  ALBUMIN 3.8   No results for input(s): LIPASE, AMYLASE in the last 168 hours. No results for input(s): AMMONIA in the last 168 hours. Diabetic: Recent Labs    04/19/21 0440  HGBA1C 10.8*   Recent Labs  Lab 04/19/21 0737 04/19/21 1231  GLUCAP 239* 204*   Cardiac Enzymes: No results for input(s): CKTOTAL, CKMB, CKMBINDEX, TROPONINI in the last 168 hours. No results for input(s): PROBNP in the last 8760 hours. Coagulation Profile: Recent Labs  Lab 04/18/21 2118  INR 1.0   Thyroid Function Tests: No results for input(s): TSH, T4TOTAL, FREET4, T3FREE, THYROIDAB in the last 72 hours. Lipid Profile: No results for input(s): CHOL, HDL, LDLCALC, TRIG, CHOLHDL, LDLDIRECT in the last 72 hours. Anemia Panel: No results for input(s): VITAMINB12, FOLATE, FERRITIN, TIBC, IRON, RETICCTPCT in the last 72 hours. Urine analysis:    Component Value Date/Time   COLORURINE AMBER (A) 06/13/2016 1626   APPEARANCEUR CLEAR 06/13/2016 1626   LABSPEC 1.018 06/13/2016 1626   PHURINE 6.0 06/13/2016 1626   GLUCOSEU NEGATIVE 06/13/2016 1626   HGBUR SMALL (A) 06/13/2016 1626   BILIRUBINUR NEGATIVE 06/13/2016 1626   KETONESUR 5 (A) 06/13/2016 1626   PROTEINUR NEGATIVE 06/13/2016 1626   NITRITE NEGATIVE 06/13/2016 1626   LEUKOCYTESUR NEGATIVE 06/13/2016 1626   Sepsis Labs: Invalid input(s): PROCALCITONIN, Free [place]  Microbiology: Recent Results (from the past 240 hour(s))  Resp Panel by RT-PCR (Flu A&B, Covid) Nasopharyngeal Swab     Status: None   Collection Time: 04/18/21  9:19 PM   Specimen: Nasopharyngeal Swab; Nasopharyngeal(NP) swabs in vial transport medium  Result Value Ref Range Status   SARS Coronavirus 2 by RT PCR NEGATIVE NEGATIVE Final    Comment: (NOTE) SARS-CoV-2 target nucleic acids are NOT DETECTED.  The SARS-CoV-2 RNA is generally detectable in upper respiratory specimens during the acute phase of infection. The lowest concentration of  SARS-CoV-2 viral copies this assay can detect is 138 copies/mL. A negative result does not preclude SARS-Cov-2 infection and should not be used as the sole basis for treatment or other patient management decisions. A negative result may occur with  improper specimen collection/handling, submission of specimen other than nasopharyngeal swab, presence of viral mutation(s) within the areas targeted by this assay, and inadequate number of viral copies(<138 copies/mL). A negative result must be combined with clinical observations, patient history, and epidemiological information. The expected result is Negative.  Fact Sheet for Patients:  EntrepreneurPulse.com.au  Fact Sheet for Healthcare Providers:  IncredibleEmployment.be  This test is no t yet approved or cleared by the [place] FDA and  has been authorized for detection and/or diagnosis of SARS-CoV-2 by FDA under an Emergency Use Authorization (EUA). This EUA will remain  in effect (meaning this test can be used) for the duration of the COVID-19 declaration under Section 564(b)(1) of  the Act, 21 U.S.C.section 360bbb-3(b)(1), unless the authorization is terminated  or revoked sooner.       Influenza A by PCR NEGATIVE NEGATIVE Final   Influenza B by PCR NEGATIVE NEGATIVE Final    Comment: (NOTE) The Xpert Xpress SARS-CoV-2/FLU/RSV plus assay is intended as an aid in the diagnosis of influenza from Nasopharyngeal swab specimens and should not be used as a sole basis for treatment. Nasal washings and aspirates are unacceptable for Xpert Xpress SARS-CoV-2/FLU/RSV testing.  Fact Sheet for Patients: EntrepreneurPulse.com.au  Fact Sheet for Healthcare Providers: IncredibleEmployment.be  This test is not yet approved or cleared by the [place] FDA and has been authorized for detection and/or diagnosis of SARS-CoV-2 by FDA under an Emergency Use  Authorization (EUA). This EUA will remain in effect (meaning this test can be used) for the duration of the COVID-19 declaration under Section 564(b)(1) of the Act, 21 U.S.C. section 360bbb-3(b)(1), unless the authorization is terminated or revoked.  Performed at [facility], [place] [address] [place], [place] [address]     Radiology Studies: No results found.    Delmont Prosch T. [place]  If 7PM-7AM, please contact night-coverage [email or web address] 04/19/2021, 1:28 PM

## 2021-04-19 NOTE — Progress Notes (Signed)
Pt arrived to floor aroun 1650 by ED tech via stretcher. Pt was able to slide herself from stretcher to hospital bed. Pt can ambulate with SBA. Pt alert and oriented x4 in no acute distress. VSS. Respirations even and unlabored on room air. Pt oriented to room. Call bell within reach. Pt instructed on use of call bell. Bed in low position.

## 2021-04-19 NOTE — ED Notes (Signed)
Pt stated she felt like she was gushing blood from her rectum. This RN checked pt who was bleeding a little bit. This RN helped clean the pt up and changed the pt's gown. This RN will continue to monitor.

## 2021-04-19 NOTE — ED Notes (Signed)
Cleaned patient up placed a brief on patient is resting with call bell in reach

## 2021-04-20 DIAGNOSIS — K921 Melena: Secondary | ICD-10-CM | POA: Diagnosis not present

## 2021-04-20 DIAGNOSIS — E1165 Type 2 diabetes mellitus with hyperglycemia: Secondary | ICD-10-CM | POA: Diagnosis not present

## 2021-04-20 DIAGNOSIS — I10 Essential (primary) hypertension: Secondary | ICD-10-CM | POA: Diagnosis not present

## 2021-04-20 DIAGNOSIS — E039 Hypothyroidism, unspecified: Secondary | ICD-10-CM | POA: Diagnosis not present

## 2021-04-20 DIAGNOSIS — E876 Hypokalemia: Secondary | ICD-10-CM | POA: Diagnosis not present

## 2021-04-20 DIAGNOSIS — K922 Gastrointestinal hemorrhage, unspecified: Secondary | ICD-10-CM | POA: Diagnosis not present

## 2021-04-20 DIAGNOSIS — E538 Deficiency of other specified B group vitamins: Secondary | ICD-10-CM | POA: Diagnosis not present

## 2021-04-20 LAB — IRON AND TIBC
Iron: 40 ug/dL (ref 28–170)
Saturation Ratios: 13 % (ref 10.4–31.8)
TIBC: 297 ug/dL (ref 250–450)
UIBC: 257 ug/dL

## 2021-04-20 LAB — CBC
HCT: 32.8 % — ABNORMAL LOW (ref 36.0–46.0)
HCT: 35.3 % — ABNORMAL LOW (ref 36.0–46.0)
Hemoglobin: 10.9 g/dL — ABNORMAL LOW (ref 12.0–15.0)
Hemoglobin: 11.5 g/dL — ABNORMAL LOW (ref 12.0–15.0)
MCH: 29.9 pg (ref 26.0–34.0)
MCH: 29.8 pg (ref 26.0–34.0)
MCHC: 33.2 g/dL (ref 30.0–36.0)
MCHC: 32.6 g/dL (ref 30.0–36.0)
MCV: 89.9 fL (ref 80.0–100.0)
MCV: 91.5 fL (ref 80.0–100.0)
Platelets: 170 10*3/uL (ref 150–400)
Platelets: 168 10*3/uL (ref 150–400)
RBC: 3.65 MIL/uL — ABNORMAL LOW (ref 3.87–5.11)
RBC: 3.86 MIL/uL — ABNORMAL LOW (ref 3.87–5.11)
RDW: 11.4 % — ABNORMAL LOW (ref 11.5–15.5)
RDW: 11.4 % — ABNORMAL LOW (ref 11.5–15.5)
WBC: 4.6 10*3/uL (ref 4.0–10.5)
WBC: 7.1 10*3/uL (ref 4.0–10.5)
nRBC: 0 % (ref 0.0–0.2)
nRBC: 0 % (ref 0.0–0.2)

## 2021-04-20 LAB — RETICULOCYTES
Immature Retic Fract: 14.7 % (ref 2.3–15.9)
RBC.: 3.62 MIL/uL — ABNORMAL LOW (ref 3.87–5.11)
Retic Count, Absolute: 69.5 10*3/uL (ref 19.0–186.0)
Retic Ct Pct: 1.9 % (ref 0.4–3.1)

## 2021-04-20 LAB — LIPID PANEL
Cholesterol: 151 mg/dL (ref 0–200)
HDL: 35 mg/dL — ABNORMAL LOW (ref >40)
LDL Cholesterol: 82 mg/dL (ref 0–99)
Total CHOL/HDL Ratio: 4.3 RATIO
Triglycerides: 168 mg/dL — ABNORMAL HIGH (ref <150)
VLDL: 34 mg/dL (ref 0–40)

## 2021-04-20 LAB — RENAL FUNCTION PANEL
Albumin: 3.3 g/dL — ABNORMAL LOW (ref 3.5–5.0)
Anion gap: 6 (ref 5–15)
BUN: 9 mg/dL (ref 8–23)
CO2: 26 mmol/L (ref 22–32)
Calcium: 8.7 mg/dL — ABNORMAL LOW (ref 8.9–10.3)
Chloride: 103 mmol/L (ref 98–111)
Creatinine, Ser: 0.75 mg/dL (ref 0.44–1.00)
GFR, Estimated: >60 mL/min (ref >60)
Glucose, Bld: 192 mg/dL — ABNORMAL HIGH (ref 70–99)
Phosphorus: 3.6 mg/dL (ref 2.5–4.6)
Potassium: 3.2 mmol/L — ABNORMAL LOW (ref 3.5–5.1)
Sodium: 135 mmol/L (ref 135–145)

## 2021-04-20 LAB — GLUCOSE, CAPILLARY
Glucose-Capillary: 196 mg/dL — ABNORMAL HIGH (ref 70–99)
Glucose-Capillary: 226 mg/dL — ABNORMAL HIGH (ref 70–99)
Glucose-Capillary: 159 mg/dL — ABNORMAL HIGH (ref 70–99)
Glucose-Capillary: 188 mg/dL — ABNORMAL HIGH (ref 70–99)

## 2021-04-20 LAB — HEMOGLOBIN AND HEMATOCRIT, BLOOD
HCT: 35.9 % — ABNORMAL LOW (ref 36.0–46.0)
Hemoglobin: 11.6 g/dL — ABNORMAL LOW (ref 12.0–15.0)

## 2021-04-20 LAB — MAGNESIUM: Magnesium: 1.7 mg/dL (ref 1.7–2.4)

## 2021-04-20 LAB — FOLATE: Folate: 42.8 ng/mL (ref >5.9)

## 2021-04-20 LAB — FERRITIN: Ferritin: 90 ng/mL (ref 11–307)

## 2021-04-20 LAB — VITAMIN B12: Vitamin B-12: 182 pg/mL (ref 180–914)

## 2021-04-20 MED ORDER — CYANOCOBALAMIN 1000 MCG/ML IJ SOLN
1000.0000 ug | Freq: Once | INTRAMUSCULAR | Status: DC
  Filled 2021-04-20: qty 1

## 2021-04-20 MED ORDER — HYDRALAZINE HCL 25 MG PO TABS: 25.0000 mg | ORAL_TABLET | Freq: Three times a day (TID) | ORAL | 2 refills | Status: DC

## 2021-04-20 MED ORDER — LISINOPRIL-HYDROCHLOROTHIAZIDE 20-12.5 MG PO TABS: 1.0000 | ORAL_TABLET | Freq: Every day | ORAL | 1 refills | Status: AC

## 2021-04-20 MED ORDER — BASAGLAR KWIKPEN 100 UNIT/ML ~~LOC~~ SOPN: 10.0000 [IU] | PEN_INJECTOR | Freq: Every day | SUBCUTANEOUS | 1 refills | Status: DC

## 2021-04-20 MED ORDER — DIPHENHYDRAMINE HCL 25 MG PO CAPS
25.0000 mg | ORAL_CAPSULE | Freq: Every evening | ORAL | Status: DC | PRN
Start: 2021-04-20 — End: 2021-04-21
  Administered 2021-04-20: 23:00:00 25 mg via ORAL
  Filled 2021-04-20: qty 2

## 2021-04-20 MED ORDER — ACETAMINOPHEN 325 MG PO TABS: 650.0000 mg | ORAL_TABLET | Freq: Four times a day (QID) | ORAL | Status: AC | PRN

## 2021-04-20 MED ORDER — DIPHENHYDRAMINE-APAP (SLEEP) 25-500 MG PO TABS: 1.0000 | ORAL_TABLET | Freq: Every day | ORAL | Status: DC | PRN

## 2021-04-20 MED ORDER — VITAMIN B-12 1000 MCG PO TABS
1000.0000 ug | ORAL_TABLET | Freq: Every day | ORAL | Status: DC
  Filled 2021-04-20: qty 1

## 2021-04-20 MED ORDER — MAGNESIUM SULFATE 2 GM/50ML IV SOLN
2.0000 g | Freq: Once | INTRAVENOUS | Status: AC
  Administered 2021-04-20: 09:00:00 2 g via INTRAVENOUS
  Filled 2021-04-20: qty 50

## 2021-04-20 MED ORDER — LIVING WELL WITH DIABETES BOOK
Freq: Once | Status: DC
  Filled 2021-04-20: qty 1

## 2021-04-20 MED ORDER — PEN NEEDLES 30G X 5 MM MISC: 1.0000 "pen " | Freq: Two times a day (BID) | 1 refills | Status: DC

## 2021-04-20 MED ORDER — BLOOD GLUCOSE MONITOR KIT: PACK | 0 refills | Status: DC

## 2021-04-20 MED ORDER — POTASSIUM CHLORIDE CRYS ER 20 MEQ PO TBCR
40.0000 meq | EXTENDED_RELEASE_TABLET | ORAL | Status: AC
  Administered 2021-04-20 (×2): 40 meq via ORAL
  Filled 2021-04-20 (×2): qty 2

## 2021-04-20 MED ORDER — CLONIDINE HCL 0.1 MG PO TABS: 0.1000 mg | ORAL_TABLET | Freq: Two times a day (BID) | ORAL | 11 refills | Status: AC

## 2021-04-20 MED ORDER — METFORMIN HCL 500 MG PO TABS: 500.0000 mg | ORAL_TABLET | Freq: Two times a day (BID) | ORAL | 3 refills | Status: DC

## 2021-04-20 MED ORDER — ACETAMINOPHEN 500 MG PO TABS
500.0000 mg | ORAL_TABLET | Freq: Every evening | ORAL | Status: DC | PRN
  Administered 2021-04-20: 23:00:00 500 mg via ORAL
  Filled 2021-04-20: qty 1

## 2021-04-20 MED ORDER — ATORVASTATIN CALCIUM 20 MG PO TABS: 20.0000 mg | ORAL_TABLET | Freq: Every day | ORAL | 1 refills | Status: DC

## 2021-04-20 NOTE — Progress Notes (Signed)
°  Transition of Care [facility]) Screening Note   Patient Details  Name: Heather Wilcox Date of Birth: [birth year]   Transition of Care [facility]) CM/SW Contact:    Benard Halsted, LCSW Phone Number: 04/20/2021, 8:49 AM    Transition of Care Department [facility]) has reviewed patient and no TOC needs have been identified at this time. We will continue to monitor patient advancement through interdisciplinary progression rounds. If new patient transition needs arise, please place a TOC consult.

## 2021-04-20 NOTE — Progress Notes (Addendum)
Inpatient Diabetes Program Recommendations  AACE/ADA: New Consensus Statement on Inpatient Glycemic Control (2015)  Target Ranges:  Prepandial:   less than 140 mg/dL      Peak postprandial:   less than 180 mg/dL (1-2 hours)      Critically ill patients:  140 - 180 mg/dL   Lab Results  Component Value Date   GLUCAP 196 (H) 04/20/2021   HGBA1C 10.8 (H) 04/19/2021    Review of Glycemic Control  Diabetes history: new onset type 2 Outpatient Diabetes medications: none Current orders for Inpatient glycemic control: Semglee 10 units daily, Novolog SENSITIVE correction scale TID & HS scale  Inpatient Diabetes Program Recommendations:   Noted that patient has new onset diabetes. hgbA1C is 10.8%. spoke with patient on the phone about the new diagnosis. States that she had high blood sugars about 20 years ago and was started on Metformin at that time. She was not able to tolerate the Metformin. She was never on anything else.   States that she drinks water and coffee with creamer. She does not drink sodas, sweet tea, or juices. Usually eats frozen foods with meal and vegetables, likes salads. She does not eat potatoes or rice. She seems to want to compliant with diet and medications if needed. Discussed HgbA1C of 10.8% and what it meant for blood sugars levels. Discussed checking blood sugars at home with a meter. States that she can get a meter and strips at her drugstore.  Patient will be seeing her PCP, Dr. Smith Mince, again in February, after last visit in November. Will need to follow up with PCP for glucose control. Ordered living Well with Diabetes booklet for her to take home with her.   Home blood glucose meter kit order #[id number] at discharge.   Harvel Ricks RN BSN CDE Diabetes Coordinator Pager: [phone]  8am-5pm

## 2021-04-20 NOTE — Evaluation (Signed)
Physical Therapy Evaluation/ Discharge Patient Details Name: Heather Wilcox MRN: [id number] DOB: February 13, 1941 Today's Date: 04/20/2021  History of Present Illness  80 yo admitted 12/21 with GIB. PMhx: HTN, hypothyroidism, colon CA s/p partial Rt colectomy, Lt crani for tumor resection, glaucoma, DM  Clinical Impression  Pt very pleasant, sitting in chair on arrival. Pt lives alone, cares for herself and no history of falls. Pt able to don/doff sock, walk in hall and complete stairs independently. Pt at baseline functional level without further acute physical therapy needs at this time. Pt agreeable to no needs, will sign off and encouraged continued hall ambulation acutely.      Recommendations for follow up therapy are one component of a multi-disciplinary discharge planning process, led by the attending physician.  Recommendations may be updated based on patient status, additional functional criteria and insurance authorization.  Follow Up Recommendations No PT follow up    Assistance Recommended at Discharge None  Functional Status Assessment Patient has not had a recent decline in their functional status  Equipment Recommendations  None recommended by PT    Recommendations for Other Services       Precautions / Restrictions Precautions Precautions: None      Mobility  Bed Mobility               General bed mobility comments: in chair on arrival and end of session    Transfers Overall transfer level: Independent                      Ambulation/Gait Ambulation/Gait assistance: Independent Gait Distance (Feet): 400 Feet Assistive device: None Gait Pattern/deviations: WFL(Within Functional Limits)   Gait velocity interpretation: >4.37 ft/sec, indicative of normal walking speed      Stairs Stairs: Yes Stairs assistance: Modified independent (Device/Increase time) Stair Management: One rail Right;Alternating pattern;Forwards Number of Stairs: 3     Wheelchair Mobility    Modified Rankin (Stroke Patients Only)       Balance Overall balance assessment: No apparent balance deficits (not formally assessed)                                           Pertinent Vitals/Pain Pain Assessment: No/denies pain    Home Living Family/patient expects to be discharged to:: Private residence Living Arrangements: Alone Available Help at Discharge: Family;Available PRN/intermittently Type of Home: House Home Access: Stairs to enter Entrance Stairs-Rails: None Entrance Stairs-Number of Steps: 3   Home Layout: One level Home Equipment: Shower seat;BSC/3in1      Prior Function Prior Level of Function : Independent/Modified Independent                     Hand Dominance        Extremity/Trunk Assessment   Upper Extremity Assessment Upper Extremity Assessment: Overall WFL for tasks assessed    Lower Extremity Assessment Lower Extremity Assessment: Overall WFL for tasks assessed    Cervical / Trunk Assessment Cervical / Trunk Assessment: Normal  Communication   Communication: No difficulties  Cognition Arousal/Alertness: Awake/alert Behavior During Therapy: WFL for tasks assessed/performed Overall Cognitive Status: Within Functional Limits for tasks assessed  General Comments      Exercises     Assessment/Plan    PT Assessment Patient does not need any further PT services  PT Problem List         PT Treatment Interventions      PT Goals (Current goals can be found in the Care Plan section)  Acute Rehab PT Goals PT Goal Formulation: All assessment and education complete, DC therapy    Frequency     Barriers to discharge        Co-evaluation               AM-PAC PT "6 Clicks" Mobility  Outcome Measure Help needed turning from your back to your side while in a flat bed without using bedrails?: None Help needed  moving from lying on your back to sitting on the side of a flat bed without using bedrails?: None Help needed moving to and from a bed to a chair (including a wheelchair)?: None Help needed standing up from a chair using your arms (e.g., wheelchair or bedside chair)?: None Help needed to walk in hospital room?: None Help needed climbing 3-5 steps with a railing? : None 6 Click Score: 24    End of Session   Activity Tolerance: Patient tolerated treatment well Patient left: in chair;with call bell/phone within reach Nurse Communication: Mobility status PT Visit Diagnosis: Difficulty in walking, not elsewhere classified (R26.2)    Time: 7026-3785 PT Time Calculation (min) (ACUTE ONLY): 18 min   Charges:   PT Evaluation $PT Eval Low Complexity: [place], PT Acute Rehabilitation Services Pager: [phone] Office: [place] 04/20/2021, 7:47 AM

## 2021-04-20 NOTE — Progress Notes (Signed)
OT Cancellation Note  Patient Details Name: Heather Wilcox MRN: [id number] DOB: 07/06/1940   Cancelled Treatment:    Reason Eval/Treat Not Completed: OT screened, no needs identified, will sign off. Pt has been seen by PT and presents at her baseline. She demonstrates independence in all ADL's and mobility. No OT needs, OT will sign off. Please re-consult if needs change.   Kamaury Cutbirth H., OTR/L Acute Rehabilitation  Eithel Ryall Elane Yolanda Bonine 04/20/2021, 7:48 AM

## 2021-04-20 NOTE — Progress Notes (Signed)
[facility] Gastroenterology Progress Note  Heather Wilcox 80 y.o. 27-Jan-1941  CC:  Painless hematochezia   Subjective: Patient states she has had not further bleeding and no further bowel movements. Denies abdominal pain, nausea, vomiting. States she would like to go home. She thinks her bleeding was due to starting a new medication (meloxicam).  ROS : Review of Systems  Gastrointestinal:  Negative for abdominal pain, blood in stool, constipation, diarrhea, heartburn, melena, nausea and vomiting.  Genitourinary:  Negative for dysuria and urgency.     Objective: Vital signs in last 24 hours: Vitals:   04/20/21 0745 04/20/21 0754  BP:  (!) 146/84  Pulse: (!) 123 100  Resp:  19  Temp:  98.3 F (36.8 C)  SpO2: 98% 97%    Physical Exam:  General:  Alert, cooperative, no distress, appears stated age  Head:  Normocephalic, without obvious abnormality, atraumatic  Eyes:  Anicteric sclera, EOM's intact  Lungs:   Clear to auscultation bilaterally, respirations unlabored  Heart:  Regular rate and rhythm, S1, S2 normal  Abdomen:   Soft, non-tender, bowel sounds active all four quadrants,  no masses,     Lab Results: Recent Labs    04/19/21 0440 04/20/21 0123  NA 133* 135  K 4.1 3.2*  CL 100 103  CO2 26 26  GLUCOSE 210* 192*  BUN 9 9  CREATININE 0.84 0.75  CALCIUM 8.9 8.7*  MG  --  1.7  PHOS  --  3.6   Recent Labs    04/18/21 1546 04/20/21 0123  AST 21  --   ALT 19  --   ALKPHOS 61  --   BILITOT 0.9  --   PROT 7.7  --   ALBUMIN 3.8 3.3*   Recent Labs    04/19/21 0440 04/20/21 0123  WBC 6.0 4.6  HGB 12.1 10.9*  HCT 38.0 32.8*  MCV 92.2 89.9  PLT 184 170   Recent Labs    04/18/21 2118  LABPROT 13.0  INR 1.0      Assessment Painless hematochezia; likely diverticular bleed - hgb  10.9, MCV normal - iron 40 - Vit B12 182 - normal renal function   Plan: No further bleeding and stable hgb. Patient likely experienced diverticular bleed. Colonoscopy  up to date, no need for procedures at this time. With no further bleeding, no need for CTA. Patient can be discharged with outpatient follow up. [facility] will sign off. Please contact us if we can be of any further assistance during this hospital stay.   Garnette Scheuermann PA-C 04/20/2021, 9:43 AM  Contact #  [phone]

## 2021-04-20 NOTE — Care Management Obs Status (Signed)
[place] NOTIFICATION   Patient Details  Name: Heather Wilcox MRN: [id number] Date of Birth: [birth year]   Medicare Observation Status Notification Given:  Yes    Angelita Ingles, RN 04/20/2021, 1:32 PM

## 2021-04-20 NOTE — Progress Notes (Signed)
PROGRESS NOTE  Heather Wilcox VPX:[id number] DOB: 03-20-1941   PCP: Benito Mccreedy, MD  Patient is from: Home  DOA: 04/18/2021 LOS: 0  Chief complaints:  Chief Complaint  Patient presents with   Blood In Stools     Brief Narrative / Interim history: 80 year old F with PMH of colon cancer s/p partial right colectomy, hypertension and hypothyroidism presenting with painless hematochezia the day of admission, and admitted for the same.  Hemodynamically stable.  FOBT positive.  Hgb 12.7.  GI consulted.   Subjective: Hemoglobin slightly dropped to 10.9 but patient has not had further bleed or bowel movement until earlier this morning.  She was cleared for discharge by GI. However, she had significant bright red blood as well as dark looking blood per rectum later in the morning.  Hemodynamically stable.  No abdominal pain or cardiopulmonary symptoms.  GI recommended watching overnight and rechecking CBC in the afternoon  Objective: Vitals:   04/20/21 0600 04/20/21 0745 04/20/21 0754 04/20/21 1150  BP:   (!) 146/84 121/73  Pulse: 72 (!) 123 100 86  Resp: 18  19 13   Temp:   98.3 F (36.8 C) 98.3 F (36.8 C)  TempSrc:   Oral Oral  SpO2: 97% 98% 97% 98%  Weight:      Height:        Examination:  GENERAL: No apparent distress.  Nontoxic. HEENT: MMM.  Vision and hearing grossly intact.  NECK: Supple.  No apparent JVD.  RESP: 98% on RA.  No IWOB.  Fair aeration bilaterally. CVS:  RRR. Heart sounds normal.  ABD/GI/GU: BS+. Abd soft, NTND.  MSK/EXT:  Moves extremities. No apparent deformity. No edema.  SKIN: no apparent skin lesion or wound NEURO: Awake and alert. Oriented appropriately.  No apparent focal neuro deficit. PSYCH: Calm. Normal affect.   Procedures:  None  Microbiology summarized: IOEVO-35 and influenza PCR nonreactive.  Assessment & Plan: Acute painless lower GI bleeding: Likely diverticular.  H&H relatively stable but expect some drop after a  significant bleed this morning.  Hemodynamically stable. Recent Labs    04/18/21 1546 04/19/21 0440 04/20/21 0123 04/20/21 1036  HGB 12.7 12.1 10.9* 11.6*  -Check CBC in the afternoon -Continue clear liquid diet  Vitamin B12 deficiency: B12 182. -Vitamin B12 1000 mcg  IM x1 followed by p.o. vitamin B12.  Uncontrolled hypertension: Probably due to noncompliance with meds.  BP improved -Continue clonidine, hydralazine and lisinopril -Further adjustment as appropriate  Hypokalemia: K3.2.  Mg 1.7. -P.o. KCl 40x2 -IV magnesium sulfate 2 g x 1  Uncontrolled DM-2 with hyperglycemia: A1c 10.8%.  Not on medication at home.  She used insulin and metformin at some point in the past.  Recent Labs  Lab 04/19/21 1231 04/19/21 1658 04/19/21 2205 04/20/21 0757 04/20/21 1151  GLUCAP 204* 220* 196* 196* 226*  -Continue Lantus 10 units daily -Increase SSI to moderate -Atorvastatin 20 mg daily -Appreciate input by diabetic coordinator  Hypothyroidism -Continue home Synthroid  History of colon cancer s/p right partial colectomy  Class I obesity Body mass index is 30.35 kg/m.         DVT prophylaxis:  SCDs Start: 04/19/21 0006  Code Status: Full code Family Communication: Patient and/or RN. Available if any question.  Level of care: Med-Surg Status is: Inpatient  The patient will require care spanning > 2 midnights and should be moved to inpatient because: Ongoing diverticular bleed that requires close monitoring.  Patient has significant bleeding this morning   Final disposition: Likely  home once bleeding subsides.   Consultants:  Gastroenterology   Sch Meds:  Scheduled Meds:  atorvastatin  20 mg Oral Daily   cloNIDine  0.1 mg Oral BID   cyanocobalamin  1,000 mcg Intramuscular Once   hydrALAZINE  25 mg Oral Q8H   insulin aspart  0-5 Units Subcutaneous QHS   insulin aspart  0-9 Units Subcutaneous TID WC   insulin glargine-yfgn  10 Units Subcutaneous Daily    levothyroxine  75 mcg Oral Q0600   lisinopril  40 mg Oral Daily   living well with diabetes book   Does not apply Once   potassium chloride  40 mEq Oral Q3H   [START ON 04/21/2021] vitamin B-12  1,000 mcg Oral Daily   Continuous Infusions: PRN Meds:.acetaminophen **OR** acetaminophen, ondansetron **OR** ondansetron (ZOFRAN) IV  Antimicrobials: Anti-infectives (From admission, onward)    None        I have personally reviewed the following labs and images: CBC: Recent Labs  Lab 04/18/21 1546 04/19/21 0440 04/20/21 0123 04/20/21 1036  WBC 5.0 6.0 4.6  --   HGB 12.7 12.1 10.9* 11.6*  HCT 39.9 38.0 32.8* 35.9*  MCV 92.8 92.2 89.9  --   PLT 181 184 170  --    BMP &GFR Recent Labs  Lab 04/18/21 1546 04/19/21 0440 04/20/21 0123  NA 132* 133* 135  K 3.3* 4.1 3.2*  CL 97* 100 103  CO2 25 26 26   GLUCOSE 265* 210* 192*  BUN 8 9 9   CREATININE 1.02* 0.84 0.75  CALCIUM 9.3 8.9 8.7*  MG  --   --  1.7  PHOS  --   --  3.6   Estimated Creatinine Clearance: 64.9 mL/min (by C-G formula based on SCr of 0.75 mg/dL). Liver & Pancreas: Recent Labs  Lab 04/18/21 1546 04/20/21 0123  AST 21  --   ALT 19  --   ALKPHOS 61  --   BILITOT 0.9  --   PROT 7.7  --   ALBUMIN 3.8 3.3*   No results for input(s): LIPASE, AMYLASE in the last 168 hours. No results for input(s): AMMONIA in the last 168 hours. Diabetic: Recent Labs    04/19/21 0440  HGBA1C 10.8*   Recent Labs  Lab 04/19/21 1231 04/19/21 1658 04/19/21 2205 04/20/21 0757 04/20/21 1151  GLUCAP 204* 220* 196* 196* 226*   Cardiac Enzymes: No results for input(s): CKTOTAL, CKMB, CKMBINDEX, TROPONINI in the last 168 hours. No results for input(s): PROBNP in the last 8760 hours. Coagulation Profile: Recent Labs  Lab 04/18/21 2118  INR 1.0   Thyroid Function Tests: No results for input(s): TSH, T4TOTAL, FREET4, T3FREE, THYROIDAB in the last 72 hours. Lipid Profile: Recent Labs    04/20/21 0123  CHOL 151   HDL 35*  LDLCALC 82  TRIG 168*  CHOLHDL 4.3   Anemia Panel: Recent Labs    04/20/21 0123  VITAMINB12 182  FOLATE 42.8  FERRITIN 90  TIBC 297  IRON 40  RETICCTPCT 1.9   Urine analysis:    Component Value Date/Time   COLORURINE AMBER (A) 06/13/2016 1626   APPEARANCEUR CLEAR 06/13/2016 1626   LABSPEC 1.018 06/13/2016 1626   PHURINE 6.0 06/13/2016 1626   GLUCOSEU NEGATIVE 06/13/2016 1626   HGBUR SMALL (A) 06/13/2016 1626   BILIRUBINUR NEGATIVE 06/13/2016 1626   KETONESUR 5 (A) 06/13/2016 1626   PROTEINUR NEGATIVE 06/13/2016 1626   NITRITE NEGATIVE 06/13/2016 1626   LEUKOCYTESUR NEGATIVE 06/13/2016 1626   Sepsis Labs: Invalid input(s):  PROCALCITONIN, LACTICIDVEN  Microbiology: Recent Results (from the past 240 hour(s))  Resp Panel by RT-PCR (Flu A&B, Covid) Nasopharyngeal Swab     Status: None   Collection Time: 04/18/21  9:19 PM   Specimen: Nasopharyngeal Swab; Nasopharyngeal(NP) swabs in vial transport medium  Result Value Ref Range Status   SARS Coronavirus 2 by RT PCR NEGATIVE NEGATIVE Final    Comment: (NOTE) SARS-CoV-2 target nucleic acids are NOT DETECTED.  The SARS-CoV-2 RNA is generally detectable in upper respiratory specimens during the acute phase of infection. The lowest concentration of SARS-CoV-2 viral copies this assay can detect is 138 copies/mL. A negative result does not preclude SARS-Cov-2 infection and should not be used as the sole basis for treatment or other patient management decisions. A negative result may occur with  improper specimen collection/handling, submission of specimen other than nasopharyngeal swab, presence of viral mutation(s) within the areas targeted by this assay, and inadequate number of viral copies(<138 copies/mL). A negative result must be combined with clinical observations, patient history, and epidemiological information. The expected result is Negative.  Fact Sheet for Patients:   EntrepreneurPulse.com.au  Fact Sheet for Healthcare Providers:  IncredibleEmployment.be  This test is no t yet approved or cleared by the [place] FDA and  has been authorized for detection and/or diagnosis of SARS-CoV-2 by FDA under an Emergency Use Authorization (EUA). This EUA will remain  in effect (meaning this test can be used) for the duration of the COVID-19 declaration under Section 564(b)(1) of the Act, 21 U.S.C.section 360bbb-3(b)(1), unless the authorization is terminated  or revoked sooner.       Influenza A by PCR NEGATIVE NEGATIVE Final   Influenza B by PCR NEGATIVE NEGATIVE Final    Comment: (NOTE) The Xpert Xpress SARS-CoV-2/FLU/RSV plus assay is intended as an aid in the diagnosis of influenza from Nasopharyngeal swab specimens and should not be used as a sole basis for treatment. Nasal washings and aspirates are unacceptable for Xpert Xpress SARS-CoV-2/FLU/RSV testing.  Fact Sheet for Patients: EntrepreneurPulse.com.au  Fact Sheet for Healthcare Providers: IncredibleEmployment.be  This test is not yet approved or cleared by the [place] FDA and has been authorized for detection and/or diagnosis of SARS-CoV-2 by FDA under an Emergency Use Authorization (EUA). This EUA will remain in effect (meaning this test can be used) for the duration of the COVID-19 declaration under Section 564(b)(1) of the Act, 21 U.S.C. section 360bbb-3(b)(1), unless the authorization is terminated or revoked.  Performed at [facility], [place] [address], [place],  [address]     Radiology Studies: No results found.    Hilari Wethington T. [place]  If 7PM-7AM, please contact night-coverage [email or web address] 04/20/2021, 12:24 PM

## 2021-04-21 DIAGNOSIS — E1165 Type 2 diabetes mellitus with hyperglycemia: Secondary | ICD-10-CM | POA: Diagnosis not present

## 2021-04-21 DIAGNOSIS — I10 Essential (primary) hypertension: Secondary | ICD-10-CM | POA: Diagnosis not present

## 2021-04-21 DIAGNOSIS — E538 Deficiency of other specified B group vitamins: Secondary | ICD-10-CM | POA: Diagnosis not present

## 2021-04-21 DIAGNOSIS — K922 Gastrointestinal hemorrhage, unspecified: Secondary | ICD-10-CM | POA: Diagnosis not present

## 2021-04-21 DIAGNOSIS — E039 Hypothyroidism, unspecified: Secondary | ICD-10-CM | POA: Diagnosis not present

## 2021-04-21 DIAGNOSIS — K921 Melena: Secondary | ICD-10-CM | POA: Diagnosis not present

## 2021-04-21 LAB — CBC
HCT: 34 % — ABNORMAL LOW (ref 36.0–46.0)
Hemoglobin: 10.9 g/dL — ABNORMAL LOW (ref 12.0–15.0)
MCH: 29.5 pg (ref 26.0–34.0)
MCHC: 32.1 g/dL (ref 30.0–36.0)
MCV: 92.1 fL (ref 80.0–100.0)
Platelets: 172 10*3/uL (ref 150–400)
RBC: 3.69 MIL/uL — ABNORMAL LOW (ref 3.87–5.11)
RDW: 11.4 % — ABNORMAL LOW (ref 11.5–15.5)
WBC: 6.2 10*3/uL (ref 4.0–10.5)
nRBC: 0 % (ref 0.0–0.2)

## 2021-04-21 LAB — RENAL FUNCTION PANEL
Albumin: 3.3 g/dL — ABNORMAL LOW (ref 3.5–5.0)
Anion gap: 9 (ref 5–15)
BUN: 11 mg/dL (ref 8–23)
CO2: 23 mmol/L (ref 22–32)
Calcium: 8.8 mg/dL — ABNORMAL LOW (ref 8.9–10.3)
Chloride: 107 mmol/L (ref 98–111)
Creatinine, Ser: 0.81 mg/dL (ref 0.44–1.00)
GFR, Estimated: >60 mL/min (ref >60)
Glucose, Bld: 193 mg/dL — ABNORMAL HIGH (ref 70–99)
Phosphorus: 3.2 mg/dL (ref 2.5–4.6)
Potassium: 4.1 mmol/L (ref 3.5–5.1)
Sodium: 139 mmol/L (ref 135–145)

## 2021-04-21 LAB — GLUCOSE, CAPILLARY
Glucose-Capillary: 155 mg/dL — ABNORMAL HIGH (ref 70–99)
Glucose-Capillary: 179 mg/dL — ABNORMAL HIGH (ref 70–99)

## 2021-04-21 LAB — MAGNESIUM: Magnesium: 2.1 mg/dL (ref 1.7–2.4)

## 2021-04-21 MED ORDER — AMLODIPINE BESYLATE 10 MG PO TABS
10.0000 mg | ORAL_TABLET | Freq: Every day | ORAL | Status: DC
  Administered 2021-04-21: 11:00:00 10 mg via ORAL
  Filled 2021-04-21: qty 1

## 2021-04-21 MED ORDER — AMLODIPINE BESYLATE 10 MG PO TABS: 10.0000 mg | ORAL_TABLET | Freq: Every day | ORAL | 1 refills | Status: AC

## 2021-04-21 NOTE — Discharge Summary (Signed)
Physician Discharge Summary  Heather Wilcox BTD:[id number] DOB: 30-Sep-1940 DOA: 04/18/2021  PCP: Benito Mccreedy, MD  Admit date: 04/18/2021 Discharge date: 04/21/2021 Admitted From: Home Disposition: Home Recommendations for Outpatient Follow-up:  Follow up with PCP as below Outpatient follow-up with [facility] in 4 to 6 weeks Please obtain CBC/BMP/Mag at follow up Check blood pressure and adjust antihypertensive meds as appropriate Please follow up on the following pending results: None Home Health: Not indicated Equipment/Devices: Not indicated Discharge Condition: Stable CODE STATUS: Full code  Follow-up Information     Osei-Bonsu, [name], MD. Schedule an appointment as soon as possible for a visit in 1 week(s).   Specialty: Internal Medicine Contact information: [address] [place] [address] [phone]                Hospital Course: 80 year old F with PMH of colon cancer s/p partial right colectomy, hypertension and hypothyroidism presenting with painless hematochezia the day of admission, and admitted for the same.  Hemodynamically stable.  FOBT positive.  Hgb 12.7.  GI consulted.   Rectal bleed felt to be diverticular, and resolved with bowel rest.  She tolerated full liquid diet.  Hgb dropped to 10.9 and remained stable after that.  Cleared for discharge by gastroenterology.  Of note, patient blood pressure was not well controlled, requiring adjustment to antihypertensive meds.   See individual problem list below for more on hospital course.  Discharge Diagnoses:  Acute painless lower GI bleeding: Likely diverticular. She had 1 episode of GI bleed on 12/23 while hospitalized.  However, no significant drop in Hgb after bleeding.  Remained hemodynamically stable.  Cleared for discharge by gastroenterology for outpatient follow-up. Recent Labs    04/18/21 1546 04/19/21 0440 04/20/21 0123 04/20/21 1036 04/20/21 1606 04/21/21 0954  HGB 12.7  12.1 10.9* 11.6* 11.5* 10.9*  -Check CBC in 1 week -Outpatient follow-up with GI in 4 to 6 weeks -Discontinued meloxicam    Vitamin B12 deficiency: B12 182. -Vitamin B12 1000 mcg  IM x1 followed by p.o. vitamin B12.  Uncontrolled hypertension: Improved. -Start amlodipine 10 mg daily-better for compliance -Discontinue hydralazine -Decrease lisinopril/HCTZ to 20/12.5 mg once a day -Continue clonidine 0.1 mg twice daily.  May consider weaning off this if blood pressure improves. -Reassess and adjust antihypertensive meds as appropriate.   Hypokalemia: Resolved.   Uncontrolled DM-2 with hyperglycemia: A1c 10.8%.  Not on medication at home.  She used insulin and metformin at some point in the past.  -Discharged on Lantus 10 units daily and metformin 500 mg twice daily -Started on atorvastatin.   Hypothyroidism -Continue home Synthroid   History of colon cancer s/p right partial colectomy   Class I obesity Body mass index is 30.35 kg/m.           Discharge Exam: Vitals:   04/21/21 0441 04/21/21 0816 04/21/21 1034 04/21/21 1229  BP: (!) 171/79 (!) 186/90 (!) 149/79 (!) 133/96  Pulse: 60 66  100  Temp: 98.7 F (37.1 C) 98.5 F (36.9 C)    Resp: _0 Height:      Weight:      SpO2: 98% 97%    TempSrc: Oral Oral    BMI (Calculated):         GENERAL: No apparent distress.  Nontoxic. HEENT: MMM.  Vision and hearing grossly intact.  NECK: Supple.  No apparent JVD.  RESP:  No IWOB.  Fair aeration bilaterally. CVS:  RRR. Heart sounds normal.  ABD/GI/GU: Bowel  sounds present. Soft. Non tender.  MSK/EXT:  Moves extremities. No apparent deformity. No edema.  SKIN: no apparent skin lesion or wound NEURO: Awake and alert.  Oriented appropriately.  No apparent focal neuro deficit. PSYCH: Calm. Normal affect.   Discharge Instructions  Discharge Instructions     Diet - low sodium heart healthy   Complete by: As directed    Diet Carb Modified   Complete by: As  directed    Discharge instructions   Complete by: As directed    It has been a pleasure taking care of you!  You were hospitalized due to rectal bleed that seems to have subsided.  We recommend continuing soft diet over the next 3 to 4 days.  We also recommend avoiding any over-the-counter pain medication other than plain Tylenol.  We discontinued the meloxicam as it could increase your risk of bleeding.  We made adjustment to your blood pressure medications during this hospitalization.  Please review your new medication list and the directions on your medications before you take them.  Follow-up with your primary care doctor in 1 to 2 weeks.  Managing your diabetes:  Your A1c is 10.8%. Your goal A1c is less than 7.0%. See below for more information about A1c.  Check your blood glucose 3 times a day 15 minutes before each meal and keep blood glucose log. Inject 10 units of lantus at the same time. You may increase by 2 units the next day if your blood glucose is persistently greater than 180. Skip if your blood glucose is less than 100.  Take your other diabetic medication (Metformin) as prescribed. Eat regular meals (breakfast, lunch and dinner) around-the-clock. You may snack as needed. See below about few tips and recommendations on diet and exercise.  Watch for symptoms of low blood sugar (hypoglycemia). Read below about these symptoms and management. Please follow-up with your primary care doctor in 1 to 2 weeks.  Take your medications and blood glucose log to your follow-up. You also need an eye exam as soon as possible.  Please find an eye doctor and schedule an appointment as soon as possible. Do not walk barefoot.  Wear appropriate shoe with good cushion . Check your feet for any skin break or wound daily.  What is A1c:  The A1C test result reflects your average blood sugar level for the past two to three months. Specifically, the A1C test measures what percentage of your hemoglobin  - a protein in red blood cells that carries oxygen - is coated with sugar (glycated). The higher your A1C level, the poorer your blood sugar control and the higher your risk of diabetes complications. Portion Size ?  Choose healthier foods such as 100% whole grains, vegetables, fruits, beans, nut seeds, olive oil, most vegetable oils, fat-free dietary, wild game and fish.  Avoid sweet tea, other sweetened beverages, soda, fruit juice, cold cereal and milk and trans fat.  Eat at least 3 meals and 1-2 snacks per day.  Aim for no more than 5 hours between eating.  Eat breakfast within one hour of getting up.   Exercise at least 150 minutes per week, including weight resistance exercises 3 or 4 times per week.  Try to lose at least 7-10% of your current body weight.   Hypoglycemia ??Hypoglycemia is when the sugar (glucose) level in the blood is too low. Symptoms of low blood sugar may include: Feeling: Hungry. Worried or nervous (anxious). Sweaty and clammy. Confused. Dizzy. Sleepy. Sick to your  stomach (nauseous). Having: A fast heartbeat. A headache. A change in your vision. Jerky movements that you cannot control (seizure). Nightmares. Tingling or no feeling (numbness) around the mouth, lips, or tongue. Having trouble with: Talking. Paying attention (concentrating). Moving (coordination). Sleeping. Shaking. Passing out (fainting). Getting upset easily (irritability).  Low blood sugar can happen to people who have diabetes and people who do not have diabetes. Low blood sugar can happen quickly, and it can be an emergency. Treating Low Blood Sugar Low blood sugar is often treated by eating or drinking something sugary right away. If you can think clearly and swallow safely, follow the 15:15 rule: Take 15 grams of a fast-acting carb (carbohydrate). Some fast-acting carbs are: 1 tube of glucose gel. 3 sugar tablets (glucose pills). 6-8 pieces of hard candy. 4 oz (120 mL) of  fruit juice. 4 oz (120 mL) of regular (not diet) soda. Check your blood sugar 15 minutes after you take the carb. If your blood sugar is still at or below 70 mg/dL (3.9 mmol/L), take 15 grams of a carb again. If your blood sugar does not go above 70 mg/dL (3.9 mmol/L) after 3 tries, get help right away. After your blood sugar goes back to normal, eat a meal or a snack within 1 hour.   Treating Very Low Blood Sugar If your blood sugar is at or below 54 mg/dL (3 mmol/L), you have very low blood sugar (severe hypoglycemia). This is an emergency. Do not wait to see if the symptoms will go away. Get medical help right away. Call your local emergency services (911 in the [place] Do not drive yourself to the hospital. If you have very low blood sugar and you cannot eat or drink, you may need a glucagon shot (injection). A family member or friend should learn how to check your blood sugar and how to give you a glucagon shot. Ask your doctor if you need to have a glucagon shot kit at home. Follow these instructions at home: General instructions Avoid any diets that cause you to not eat enough food. Talk with your doctor before you start any new diet. Take over-the-counter and prescription medicines only as told by your doctor. Limit alcohol to no more than 1 drink per day for nonpregnant women and 2 drinks per day for men. One drink equals 12 oz of beer, 5 oz of wine, or 1 oz of hard liquor. Keep all follow-up visits as told by your doctor. This is important. If You Have Diabetes: ? Make sure you know the symptoms of low blood sugar. Always keep a source of sugar with you, such as: Sugar. Sugar tablets. Glucose gel. Fruit juice. Regular soda (not diet soda). Milk. Hard candy. Honey. Take your medicines as told. Follow your exercise and meal plan. Eat on time. Do not skip meals. Follow your sick day plan when you cannot eat or drink normally. Make this plan ahead of time with your doctor. Check  your blood sugar as often as told by your doctor. Always check before and after exercise. Share your diabetes care plan with: Your work or school. People you live with. Check your pee (urine) for ketones: When you are sick. As told by your doctor. Carry a card or wear jewelry that says you have diabetes. If You Have Low Blood Sugar From Other Causes: ? Check your blood sugar as often as told by your doctor. Follow instructions from your doctor about what you cannot eat or drink. Contact a  doctor if: You have trouble keeping your blood sugar in your target range. You have low blood sugar often. Get help right away if: You still have symptoms after you eat or drink something sugary. Your blood sugar is at or below 54 mg/dL (3 mmol/L). You have jerky movements that you cannot control.     Take care,   Increase activity slowly   Complete by: As directed       Allergies as of 04/21/2021       Reactions   Penicillin G Other (See Comments)   Other reaction(s): Unknown        Medication List     STOP taking these medications    diphenhydramine-acetaminophen 25-500 MG Tabs tablet Commonly known as: TYLENOL PM   hydrALAZINE 25 MG tablet Commonly known as: APRESOLINE   lisinopril-hydrochlorothiazide 20-25 MG tablet Commonly known as: ZESTORETIC Replaced by: lisinopril-hydrochlorothiazide 20-12.5 MG tablet   meloxicam 7.5 MG tablet Commonly known as: MOBIC       TAKE these medications    acetaminophen 325 MG tablet Commonly known as: TYLENOL Take 2 tablets (650 mg total) by mouth every 6 (six) hours as needed for mild pain (or Fever >/= 101).   amLODipine 10 MG tablet Commonly known as: NORVASC Take 1 tablet (10 mg total) by mouth daily. Start taking on: April 22, 2021   atorvastatin 20 MG tablet Commonly known as: LIPITOR Take 1 tablet (20 mg total) by mouth daily.   Basaglar KwikPen 100 UNIT/ML Inject 10 Units into the skin daily.   blood glucose  meter kit and supplies Kit Dispense based on patient and insurance preference. Use up to four times daily as directed.   cholecalciferol 1000 units tablet Commonly known as: VITAMIN D Take 1,000 Units by mouth daily.   cloNIDine 0.1 MG tablet Commonly known as: CATAPRES Take 1 tablet (0.1 mg total) by mouth 2 (two) times daily. What changed: when to take this   folic acid 720 MCG tablet Commonly known as: FOLVITE Take 400 mcg by mouth daily.   levothyroxine 75 MCG tablet Commonly known as: SYNTHROID Take 75 mcg by mouth daily before breakfast.   lisinopril-hydrochlorothiazide 20-12.5 MG tablet Commonly known as: Zestoretic Take 1 tablet by mouth daily. Replaces: lisinopril-hydrochlorothiazide 20-25 MG tablet   metFORMIN 500 MG tablet Commonly known as: Glucophage Take 1 tablet (500 mg total) by mouth 2 (two) times daily with a meal.   Pen Needles 30G X 5 MM Misc 1 pen by Does not apply route in the morning and at bedtime.        Consultations: Gastroenterology  Procedures/Studies:    No results found.     The results of significant diagnostics from this hospitalization (including imaging, microbiology, ancillary and laboratory) are listed below for reference.     Microbiology: Recent Results (from the past 240 hour(s))  Resp Panel by RT-PCR (Flu A&B, Covid) Nasopharyngeal Swab     Status: None   Collection Time: 04/18/21  9:19 PM   Specimen: Nasopharyngeal Swab; Nasopharyngeal(NP) swabs in vial transport medium  Result Value Ref Range Status   SARS Coronavirus 2 by RT PCR NEGATIVE NEGATIVE Final    Comment: (NOTE) SARS-CoV-2 target nucleic acids are NOT DETECTED.  The SARS-CoV-2 RNA is generally detectable in upper respiratory specimens during the acute phase of infection. The lowest concentration of SARS-CoV-2 viral copies this assay can detect is 138 copies/mL. A negative result does not preclude SARS-Cov-2 infection and should not be used as the sole  basis for treatment or other patient management decisions. A negative result may occur with  improper specimen collection/handling, submission of specimen other than nasopharyngeal swab, presence of viral mutation(s) within the areas targeted by this assay, and inadequate number of viral copies(<138 copies/mL). A negative result must be combined with clinical observations, patient history, and epidemiological information. The expected result is Negative.  Fact Sheet for Patients:  EntrepreneurPulse.com.au  Fact Sheet for Healthcare Providers:  IncredibleEmployment.be  This test is no t yet approved or cleared by the [place] FDA and  has been authorized for detection and/or diagnosis of SARS-CoV-2 by FDA under an Emergency Use Authorization (EUA). This EUA will remain  in effect (meaning this test can be used) for the duration of the COVID-19 declaration under Section 564(b)(1) of the Act, 21 U.S.C.section 360bbb-3(b)(1), unless the authorization is terminated  or revoked sooner.       Influenza A by PCR NEGATIVE NEGATIVE Final   Influenza B by PCR NEGATIVE NEGATIVE Final    Comment: (NOTE) The Xpert Xpress SARS-CoV-2/FLU/RSV plus assay is intended as an aid in the diagnosis of influenza from Nasopharyngeal swab specimens and should not be used as a sole basis for treatment. Nasal washings and aspirates are unacceptable for Xpert Xpress SARS-CoV-2/FLU/RSV testing.  Fact Sheet for Patients: EntrepreneurPulse.com.au  Fact Sheet for Healthcare Providers: IncredibleEmployment.be  This test is not yet approved or cleared by the [place] FDA and has been authorized for detection and/or diagnosis of SARS-CoV-2 by FDA under an Emergency Use Authorization (EUA). This EUA will remain in effect (meaning this test can be used) for the duration of the COVID-19 declaration under Section 564(b)(1) of the Act,  21 U.S.C. section 360bbb-3(b)(1), unless the authorization is terminated or revoked.  Performed at [facility], [place] [address] [place], [place] [address]      Labs:  CBC: Recent Labs  Lab 04/18/21 1546 04/19/21 0440 04/20/21 0123 04/20/21 1036 04/20/21 1606 04/21/21 0954  WBC 5.0 6.0 4.6  --  7.1 6.2  HGB 12.7 12.1 10.9* 11.6* 11.5* 10.9*  HCT 39.9 38.0 32.8* 35.9* 35.3* 34.0*  MCV 92.8 92.2 89.9  --  91.5 92.1  PLT 181 184 170  --  168 172   BMP &GFR Recent Labs  Lab 04/18/21 1546 04/19/21 0440 04/20/21 0123 04/21/21 0954  NA 132* 133* 135 139  K 3.3* 4.1 3.2* 4.1  CL 97* 100 103 107  CO2 _0 GLUCOSE 265* 210* 192* 193*  BUN _1 CREATININE 1.02* 0.84 0.75 0.81  CALCIUM 9.3 8.9 8.7* 8.8*  MG  --   --  1.7 2.1  PHOS  --   --  3.6 3.2   Estimated Creatinine Clearance: 64.1 mL/min (by C-G formula based on SCr of 0.81 mg/dL). Liver & Pancreas: Recent Labs  Lab 04/18/21 1546 04/20/21 0123 04/21/21 0954  AST 21  --   --   ALT 19  --   --   ALKPHOS 61  --   --   BILITOT 0.9  --   --   PROT 7.7  --   --   ALBUMIN 3.8 3.3* 3.3*   No results for input(s): LIPASE, AMYLASE in the last 168 hours. No results for input(s): AMMONIA in the last 168 hours. Diabetic: Recent Labs    04/19/21 0440  HGBA1C 10.8*   Recent Labs  Lab 04/20/21 1151 04/20/21 1613 04/20/21 2047 04/21/21 0813 04/21/21 1227  GLUCAP 226*  159* 188* 155* 179*   Cardiac Enzymes: No results for input(s): CKTOTAL, CKMB, CKMBINDEX, TROPONINI in the last 168 hours. No results for input(s): PROBNP in the last 8760 hours. Coagulation Profile: Recent Labs  Lab 04/18/21 2118  INR 1.0   Thyroid Function Tests: No results for input(s): TSH, T4TOTAL, FREET4, T3FREE, THYROIDAB in the last 72 hours. Lipid Profile: Recent Labs    04/20/21 0123  CHOL 151  HDL 35*  LDLCALC 82  TRIG 168*  CHOLHDL 4.3   Anemia Panel: Recent Labs    04/20/21 0123  VITAMINB12 182   FOLATE 42.8  FERRITIN 90  TIBC 297  IRON 40  RETICCTPCT 1.9   Urine analysis:    Component Value Date/Time   COLORURINE AMBER (A) 06/13/2016 1626   APPEARANCEUR CLEAR 06/13/2016 1626   LABSPEC 1.018 06/13/2016 1626   PHURINE 6.0 06/13/2016 1626   GLUCOSEU NEGATIVE 06/13/2016 1626   HGBUR SMALL (A) 06/13/2016 1626   BILIRUBINUR NEGATIVE 06/13/2016 1626   KETONESUR 5 (A) 06/13/2016 1626   PROTEINUR NEGATIVE 06/13/2016 1626   NITRITE NEGATIVE 06/13/2016 1626   LEUKOCYTESUR NEGATIVE 06/13/2016 1626   Sepsis Labs: Invalid input(s): PROCALCITONIN, LACTICIDVEN   Time coordinating discharge: 45 minutes  SIGNED:  Mercy Riding, MD  [facility] 04/21/2021, 8:59 PM

## 2021-04-21 NOTE — Progress Notes (Signed)
Patient ID: Heather Wilcox, female   DOB: 10/29/1940, 80 y.o.   MRN: [id number]  Patient fully dressed washing her face at the sink. Denies any bleeding overnight. Says she is going home today. Feels good. Tolerating liquid diet. Hgb 10.9.  Suspect diverticular bleed that seems to have resolved. Stable to go home from GI standpoint. F/U with GI in 4-6 weeks. Will sign off. Call if questions.

## 2021-04-25 ENCOUNTER — Other Ambulatory Visit: Payer: Self-pay | Admitting: Student

## 2021-04-25 DIAGNOSIS — E1165 Type 2 diabetes mellitus with hyperglycemia: Secondary | ICD-10-CM

## 2021-04-25 MED ORDER — INSULIN GLARGINE 100 UNIT/ML SOLOSTAR PEN: 10.0000 [IU] | PEN_INJECTOR | Freq: Every day | SUBCUTANEOUS | 1 refills | Status: DC

## 2021-04-25 NOTE — Progress Notes (Signed)
Patient's insurance prefers Lantus Engineer, civil (consulting) and instead of [employer] agricultural.  Sent new prescription for Lantus Solostar pen.

## 2021-04-26 NOTE — ED Provider Notes (Signed)
[facility] Provider Note   CSN: [id number] Arrival date & time: 04/18/21  1528     History Chief Complaint  Patient presents with   Blood In Farmer is a 80 y.o. female.  Patient presents ER chief complaint of bright red stools started today.  Otherwise denies any vomiting.  Denies any headache or chest pain or abdominal pain.  Denies lightheadedness or dizziness or shortness of breath.      Past Medical History:  Diagnosis Date   Arthritis    "left knee" (10/24/2016)   Colon cancer ([place])    ADENOMA OF COLON AT HEPATIC FLEXURE/notes 10/24/2016   History of stomach ulcers    Hypertension    Hypothyroidism     Patient Active Problem List   Diagnosis Date Noted   Acute lower GI bleeding 04/18/2021   Villous adenoma of right colon 10/24/2016   Thrombocytopenia ([place]) 05/30/2016   MENINGIOMA 10/06/2006   Hypothyroidism 10/06/2006   DM 10/06/2006   GLAUCOMA, PRIMARY OPEN-ANGLE 10/06/2006   Hypertension 10/06/2006   DEGENERATIVE JOINT DISEASE, LEFT KNEE 10/06/2006   THYROIDECTOMY, HX OF 10/06/2006    Past Surgical History:  Procedure Laterality Date   COLON SURGERY     COLONOSCOPY     CRANIOTOMY FOR TUMOR Left 04/2004    temporal craniotomy for meningioma with microdissection/notes 09/11/2010   INCISION AND DRAINAGE PERIRECTAL ABSCESS N/A 06/13/2016   Procedure: IRRIGATION AND DEBRIDEMENT PERIRECTAL ABSCESS;  Surgeon: Autumn Messing III, MD;  Location: [facility];  Service: General;  Laterality: N/A;   LAPAROSCOPIC PARTIAL RIGHT COLECTOMY Right 10/24/2016   LAPAROSCOPIC RIGHT COLECTOMY Right 10/24/2016   Procedure: LAPAROSCOPIC ASSISTED RIGHT COLECTOMY;  Surgeon: Jovita Kussmaul, MD;  Location: [facility];  Service: General;  Laterality: Right;   REPAIR OF PERFORATED ULCER     "bleeding stomach ulcer"   TOTAL THYROIDECTOMY       OB History   No obstetric history on file.     Family History  Problem Relation Age of Onset   Breast  cancer Neg Hx     Social History   Tobacco Use   Smoking status: Some Days    Packs/day: 0.10    Years: 58.00    Pack years: 5.80    Types: Cigarettes   Smokeless tobacco: Never  Vaping Use   Vaping Use: Never used  Substance Use Topics   Alcohol use: No   Drug use: No    Home Medications Prior to Admission medications   Medication Sig Start Date End Date Taking? Authorizing Provider  blood glucose meter kit and supplies KIT Dispense based on patient and insurance preference. Use up to four times daily as directed. 04/20/21  Yes Mercy Riding, MD  cholecalciferol (VITAMIN D) 1000 units tablet Take 1,000 Units by mouth daily.   Yes [provider]  folic acid (FOLVITE) 259 MCG tablet Take 400 mcg by mouth daily.   Yes [provider]  Insulin Pen Needle (PEN NEEDLES) 30G X 5 MM MISC 1 pen by Does not apply route in the morning and at bedtime. 04/20/21  Yes Mercy Riding, MD  levothyroxine (SYNTHROID, LEVOTHROID) 75 MCG tablet Take 75 mcg by mouth daily before breakfast.   Yes [provider]  lisinopril-hydrochlorothiazide (ZESTORETIC) 20-12.5 MG tablet Take 1 tablet by mouth daily. 04/20/21 10/17/21 Yes Mercy Riding, MD  metFORMIN (GLUCOPHAGE) 500 MG tablet Take 1 tablet (500 mg total) by mouth 2 (two)  times daily with a meal. 04/20/21 04/20/22 Yes Mercy Riding, MD  acetaminophen (TYLENOL) 325 MG tablet Take 2 tablets (650 mg total) by mouth every 6 (six) hours as needed for mild pain (or Fever >/= 101). 04/20/21   Mercy Riding, MD  amLODipine (NORVASC) 10 MG tablet Take 1 tablet (10 mg total) by mouth daily. 04/22/21   Mercy Riding, MD  atorvastatin (LIPITOR) 20 MG tablet Take 1 tablet (20 mg total) by mouth daily. 04/21/21   Mercy Riding, MD  cloNIDine (CATAPRES) 0.1 MG tablet Take 1 tablet (0.1 mg total) by mouth 2 (two) times daily. 04/20/21   Mercy Riding, MD  insulin glargine (LANTUS) 100 UNIT/ML Solostar Pen Inject 10 Units into the skin daily.  04/25/21   Mercy Riding, MD    Allergies    Penicillin g  Review of Systems   Review of Systems  Constitutional:  Negative for fever.  HENT:  Negative for ear pain.   Eyes:  Negative for pain.  Respiratory:  Negative for cough.   Cardiovascular:  Negative for chest pain.  Gastrointestinal:  Negative for abdominal pain.  Genitourinary:  Negative for flank pain.  Musculoskeletal:  Negative for back pain.  Skin:  Negative for rash.  Neurological:  Negative for headaches.   Physical Exam Updated Vital Signs BP (!) 133/96 (BP Location: Right Arm)    Pulse 100    Temp 98.5 F (36.9 C) (Oral)    Resp 17    Ht _0  (1.702 m)    Wt 87.9 kg    SpO2 97%    BMI 30.35 kg/m   Physical Exam Constitutional:      General: She is not in acute distress.    Appearance: Normal appearance.  HENT:     Head: Normocephalic.     Nose: Nose normal.  Eyes:     Extraocular Movements: Extraocular movements intact.  Cardiovascular:     Rate and Rhythm: Normal rate.  Pulmonary:     Effort: Pulmonary effort is normal.  Abdominal:     Tenderness: There is no abdominal tenderness. There is no guarding or rebound.  Genitourinary:    Rectum: Guaiac result positive.  Musculoskeletal:        General: Normal range of motion.     Cervical back: Normal range of motion.  Neurological:     General: No focal deficit present.     Mental Status: She is alert. Mental status is at baseline.    ED Results / Procedures / Treatments   Labs (all labs ordered are listed, but only abnormal results are displayed) Labs Reviewed  COMPREHENSIVE METABOLIC PANEL - Abnormal; Notable for the following components:      Result Value   Sodium 132 (*)    Potassium 3.3 (*)    Chloride 97 (*)    Glucose, Bld 265 (*)    Creatinine, Ser 1.02 (*)    GFR, Estimated 56 (*)    All other components within normal limits  CBC - Abnormal; Notable for the following components:   RDW 11.3 (*)    All other components within normal  limits  BASIC METABOLIC PANEL - Abnormal; Notable for the following components:   Sodium 133 (*)    Glucose, Bld 210 (*)    All other components within normal limits  HEMOGLOBIN A1C - Abnormal; Notable for the following components:   Hgb A1c MFr Bld 10.8 (*)    All other components within normal  limits  GLUCOSE, CAPILLARY - Abnormal; Notable for the following components:   Glucose-Capillary 220 (*)    All other components within normal limits  LIPID PANEL - Abnormal; Notable for the following components:   Triglycerides 168 (*)    HDL 35 (*)    All other components within normal limits  CBC - Abnormal; Notable for the following components:   RBC 3.65 (*)    Hemoglobin 10.9 (*)    HCT 32.8 (*)    RDW 11.4 (*)    All other components within normal limits  RENAL FUNCTION PANEL - Abnormal; Notable for the following components:   Potassium 3.2 (*)    Glucose, Bld 192 (*)    Calcium 8.7 (*)    Albumin 3.3 (*)    All other components within normal limits  RETICULOCYTES - Abnormal; Notable for the following components:   RBC. 3.62 (*)    All other components within normal limits  GLUCOSE, CAPILLARY - Abnormal; Notable for the following components:   Glucose-Capillary 196 (*)    All other components within normal limits  GLUCOSE, CAPILLARY - Abnormal; Notable for the following components:   Glucose-Capillary 196 (*)    All other components within normal limits  HEMOGLOBIN AND HEMATOCRIT, BLOOD - Abnormal; Notable for the following components:   Hemoglobin 11.6 (*)    HCT 35.9 (*)    All other components within normal limits  GLUCOSE, CAPILLARY - Abnormal; Notable for the following components:   Glucose-Capillary 226 (*)    All other components within normal limits  CBC - Abnormal; Notable for the following components:   RBC 3.86 (*)    Hemoglobin 11.5 (*)    HCT 35.3 (*)    RDW 11.4 (*)    All other components within normal limits  GLUCOSE, CAPILLARY - Abnormal; Notable for  the following components:   Glucose-Capillary 159 (*)    All other components within normal limits  GLUCOSE, CAPILLARY - Abnormal; Notable for the following components:   Glucose-Capillary 188 (*)    All other components within normal limits  RENAL FUNCTION PANEL - Abnormal; Notable for the following components:   Glucose, Bld 193 (*)    Calcium 8.8 (*)    Albumin 3.3 (*)    All other components within normal limits  CBC - Abnormal; Notable for the following components:   RBC 3.69 (*)    Hemoglobin 10.9 (*)    HCT 34.0 (*)    RDW 11.4 (*)    All other components within normal limits  GLUCOSE, CAPILLARY - Abnormal; Notable for the following components:   Glucose-Capillary 155 (*)    All other components within normal limits  GLUCOSE, CAPILLARY - Abnormal; Notable for the following components:   Glucose-Capillary 179 (*)    All other components within normal limits  POC OCCULT BLOOD, ED - Abnormal; Notable for the following components:   Fecal Occult Bld POSITIVE (*)    All other components within normal limits  CBG MONITORING, ED - Abnormal; Notable for the following components:   Glucose-Capillary 239 (*)    All other components within normal limits  CBG MONITORING, ED - Abnormal; Notable for the following components:   Glucose-Capillary 204 (*)    All other components within normal limits  RESP PANEL BY RT-PCR (FLU A&B, COVID) ARPGX2  PROTIME-INR  CBC  MAGNESIUM  VITAMIN B12  FOLATE  IRON AND TIBC  FERRITIN  MAGNESIUM  TYPE AND SCREEN  ABO/RH    EKG  None  Radiology No results found.  Procedures Procedures   Medications Ordered in ED Medications  lisinopril (ZESTRIL) tablet 20 mg (20 mg Oral Given 04/18/21 2114)  cloNIDine (CATAPRES) tablet 0.1 mg (0.1 mg Oral Given 04/18/21 2114)  potassium chloride 10 mEq in 100 mL IVPB (0 mEq Intravenous Stopped 04/19/21 0349)  potassium chloride SA (KLOR-CON M) CR tablet 40 mEq (40 mEq Oral Given 04/20/21 1257)   magnesium sulfate IVPB 2 g 50 mL (0 g Intravenous Stopped 04/20/21 0854)    ED Course  I have reviewed the triage vital signs and the nursing notes.  Pertinent labs & imaging results that were available during my care of the patient were reviewed by me and considered in my medical decision making (see chart for details).    MDM Rules/Calculators/A&P                         Patient with quite positive stool with bright red bleeding per rectum.  Labs are unremarkable vital signs are stable but on exam there is bright red blood on the gloved finger.  Case discussed with on-call GI who recommended observation in the hospital and they will evaluate the patient.  Requesting medicine to admit.  Consultation with medicine for admission.  We will start a clear liquid diet and n.p.o. after midnight for possible colonoscopy.       Final Clinical Impression(s) / ED Diagnoses Final diagnoses:  Gastrointestinal hemorrhage, unspecified gastrointestinal hemorrhage type    Rx / DC Orders ED Discharge Orders          Ordered    amLODipine (NORVASC) 10 MG tablet  Daily       Note to Pharmacy: Please discontinue the prescription for hydralazine   04/21/21 0849    atorvastatin (LIPITOR) 20 MG tablet  Daily        04/20/21 1049    cloNIDine (CATAPRES) 0.1 MG tablet  2 times daily        04/20/21 1049    hydrALAZINE (APRESOLINE) 25 MG tablet  3 times daily,   Status:  Discontinued        04/20/21 1049    metFORMIN (GLUCOPHAGE) 500 MG tablet  2 times daily with meals        04/20/21 1049    Insulin Glargine (BASAGLAR KWIKPEN) 100 UNIT/ML  Daily,   Status:  Discontinued        04/20/21 1049    blood glucose meter kit and supplies KIT        04/20/21 1049    Insulin Pen Needle (PEN NEEDLES) 30G X 5 MM MISC  2 times daily        04/20/21 1049    lisinopril-hydrochlorothiazide (ZESTORETIC) 20-12.5 MG tablet  Daily        04/20/21 1049    acetaminophen (TYLENOL) 325 MG tablet  Every 6 hours  PRN        04/20/21 1049    Increase activity slowly        04/20/21 1049    Diet - low sodium heart healthy        04/20/21 1049    Discharge instructions       Comments: It has been a pleasure taking care of you!  You were hospitalized due to rectal bleed that seems to have subsided.  We recommend continuing soft diet over the next 3 to 4 days.  We also recommend avoiding any over-the-counter pain medication other than plain  Tylenol.  We discontinued the meloxicam as it could increase your risk of bleeding.  We made adjustment to your blood pressure medications during this hospitalization.  Please review your new medication list and the directions on your medications before you take them.  Follow-up with your primary care doctor in 1 to 2 weeks.  Managing your diabetes:  Your A1c is 10.8%. Your goal A1c is less than 7.0%. See below for more information about A1c.  Check your blood glucose 3 times a day 15 minutes before each meal and keep blood glucose log. Inject 10 units of lantus at the same time. You may increase by 2 units the next day if your blood glucose is persistently greater than 180. Skip if your blood glucose is less than 100.  Take your other diabetic medication (Metformin) as prescribed. Eat regular meals (breakfast, lunch and dinner) around-the-clock. You may snack as needed. See below about few tips and recommendations on diet and exercise.  Watch for symptoms of low blood sugar (hypoglycemia). Read below about these symptoms and management. Please follow-up with your primary care doctor in 1 to 2 weeks.  Take your medications and blood glucose log to your follow-up. You also need an eye exam as soon as possible.  Please find an eye doctor and schedule an appointment as soon as possible. Do not walk barefoot.  Wear appropriate shoe with good cushion . Check your feet for any skin break or wound daily.  What is A1c:  The A1C test result reflects your average blood sugar  level for the past two to three months. Specifically, the A1C test measures what percentage of your hemoglobin - a protein in red blood cells that carries oxygen - is coated with sugar (glycated). The higher your A1C level, the poorer your blood sugar control and the higher your risk of diabetes complications. Portion Size ?  Choose healthier foods such as 100% whole grains, vegetables, fruits, beans, nut seeds, olive oil, most vegetable oils, fat-free dietary, wild game and fish.  Avoid sweet tea, other sweetened beverages, soda, fruit juice, cold cereal and milk and trans fat.  Eat at least 3 meals and 1-2 snacks per day.  Aim for no more than 5 hours between eating.  Eat breakfast within one hour of getting up.   Exercise at least 150 minutes per week, including weight resistance exercises 3 or 4 times per week.  Try to lose at least 7-10% of your current body weight.   Hypoglycemia ??Hypoglycemia is when the sugar (glucose) level in the blood is too low. Symptoms of low blood sugar may include: Feeling: Hungry. Worried or nervous (anxious). Sweaty and clammy. Confused. Dizzy. Sleepy. Sick to your stomach (nauseous). Having: A fast heartbeat. A headache. A change in your vision. Jerky movements that you cannot control (seizure). Nightmares. Tingling or no feeling (numbness) around the mouth, lips, or tongue. Having trouble with: Talking. Paying attention (concentrating). Moving (coordination). Sleeping. Shaking. Passing out (fainting). Getting upset easily (irritability).  Low blood sugar can happen to people who have diabetes and people who do not have diabetes. Low blood sugar can happen quickly, and it can be an emergency. Treating Low Blood Sugar Low blood sugar is often treated by eating or drinking something sugary right away. If you can think clearly and swallow safely, follow the 15:15 rule: Take 15 grams of a fast-acting carb (carbohydrate). Some fast-acting  carbs are: 1 tube of glucose gel. 3 sugar tablets (glucose pills). 6-8 pieces of hard  candy. 4 oz (120 mL) of fruit juice. 4 oz (120 mL) of regular (not diet) soda. Check your blood sugar 15 minutes after you take the carb. If your blood sugar is still at or below 70 mg/dL (3.9 mmol/L), take 15 grams of a carb again. If your blood sugar does not go above 70 mg/dL (3.9 mmol/L) after 3 tries, get help right away. After your blood sugar goes back to normal, eat a meal or a snack within 1 hour.   Treating Very Low Blood Sugar If your blood sugar is at or below 54 mg/dL (3 mmol/L), you have very low blood sugar (severe hypoglycemia). This is an emergency. Do not wait to see if the symptoms will go away. Get medical help right away. Call your local emergency services (911 in the [place] Do not drive yourself to the hospital. If you have very low blood sugar and you cannot eat or drink, you may need a glucagon shot (injection). A family member or friend should learn how to check your blood sugar and how to give you a glucagon shot. Ask your doctor if you need to have a glucagon shot kit at home. Follow these instructions at home: General instructions Avoid any diets that cause you to not eat enough food. Talk with your doctor before you start any new diet. Take over-the-counter and prescription medicines only as told by your doctor. Limit alcohol to no more than 1 drink per day for nonpregnant women and 2 drinks per day for men. One drink equals 12 oz of beer, 5 oz of wine, or 1 oz of hard liquor. Keep all follow-up visits as told by your doctor. This is important. If You Have Diabetes: ? Make sure you know the symptoms of low blood sugar. Always keep a source of sugar with you, such as: Sugar. Sugar tablets. Glucose gel. Fruit juice. Regular soda (not diet soda). Milk. Hard candy. Honey. Take your medicines as told. Follow your exercise and meal plan. Eat on time. Do not skip  meals. Follow your sick day plan when you cannot eat or drink normally. Make this plan ahead of time with your doctor. Check your blood sugar as often as told by your doctor. Always check before and after exercise. Share your diabetes care plan with: Your work or school. People you live with. Check your pee (urine) for ketones: When you are sick. As told by your doctor. Carry a card or wear jewelry that says you have diabetes. If You Have Low Blood Sugar From Other Causes: ? Check your blood sugar as often as told by your doctor. Follow instructions from your doctor about what you cannot eat or drink. Contact a doctor if: You have trouble keeping your blood sugar in your target range. You have low blood sugar often. Get help right away if: You still have symptoms after you eat or drink something sugary. Your blood sugar is at or below 54 mg/dL (3 mmol/L). You have jerky movements that you cannot control.     Take care,   04/20/21 1049    Diet Carb Modified        04/20/21 1049             [place], [place] 04/26/21 2034

## 2021-05-03 DIAGNOSIS — M1712 Unilateral primary osteoarthritis, left knee: Secondary | ICD-10-CM | POA: Diagnosis not present

## 2021-05-03 DIAGNOSIS — E119 Type 2 diabetes mellitus without complications: Secondary | ICD-10-CM | POA: Diagnosis not present

## 2021-05-03 DIAGNOSIS — Z87898 Personal history of other specified conditions: Secondary | ICD-10-CM | POA: Diagnosis not present

## 2021-05-03 DIAGNOSIS — Z8719 Personal history of other diseases of the digestive system: Secondary | ICD-10-CM | POA: Diagnosis not present

## 2021-05-03 DIAGNOSIS — E039 Hypothyroidism, unspecified: Secondary | ICD-10-CM | POA: Diagnosis not present

## 2021-05-03 DIAGNOSIS — Z720 Tobacco use: Secondary | ICD-10-CM | POA: Diagnosis not present

## 2021-05-03 DIAGNOSIS — I10 Essential (primary) hypertension: Secondary | ICD-10-CM | POA: Diagnosis not present

## 2021-05-03 DIAGNOSIS — G5139 Clonic hemifacial spasm, unspecified: Secondary | ICD-10-CM | POA: Diagnosis not present

## 2021-05-03 DIAGNOSIS — E7211 Homocystinuria: Secondary | ICD-10-CM | POA: Diagnosis not present

## 2021-05-09 DIAGNOSIS — K625 Hemorrhage of anus and rectum: Secondary | ICD-10-CM | POA: Diagnosis not present

## 2021-05-09 DIAGNOSIS — K921 Melena: Secondary | ICD-10-CM | POA: Diagnosis not present

## 2021-05-15 DIAGNOSIS — Z980 Intestinal bypass and anastomosis status: Secondary | ICD-10-CM | POA: Diagnosis not present

## 2021-05-15 DIAGNOSIS — K5730 Diverticulosis of large intestine without perforation or abscess without bleeding: Secondary | ICD-10-CM | POA: Diagnosis not present

## 2021-05-15 DIAGNOSIS — K648 Other hemorrhoids: Secondary | ICD-10-CM | POA: Diagnosis not present

## 2021-05-15 DIAGNOSIS — K921 Melena: Secondary | ICD-10-CM | POA: Diagnosis not present

## 2021-05-15 DIAGNOSIS — D125 Benign neoplasm of sigmoid colon: Secondary | ICD-10-CM | POA: Diagnosis not present

## 2021-05-15 DIAGNOSIS — K625 Hemorrhage of anus and rectum: Secondary | ICD-10-CM | POA: Diagnosis not present

## 2021-05-15 DIAGNOSIS — Z86010 Personal history of colonic polyps: Secondary | ICD-10-CM | POA: Diagnosis not present

## 2021-05-17 DIAGNOSIS — E1165 Type 2 diabetes mellitus with hyperglycemia: Secondary | ICD-10-CM | POA: Diagnosis not present

## 2021-05-17 DIAGNOSIS — I10 Essential (primary) hypertension: Secondary | ICD-10-CM | POA: Diagnosis not present

## 2021-05-18 DIAGNOSIS — D125 Benign neoplasm of sigmoid colon: Secondary | ICD-10-CM | POA: Diagnosis not present

## 2021-05-29 ENCOUNTER — Ambulatory Visit (INDEPENDENT_AMBULATORY_CARE_PROVIDER_SITE_OTHER): Payer: Medicare Other | Admitting: Neurology

## 2021-05-29 ENCOUNTER — Encounter: Payer: Self-pay | Admitting: Neurology

## 2021-05-29 ENCOUNTER — Telehealth: Payer: Self-pay | Admitting: Neurology

## 2021-05-29 VITALS — BP 136/86 | HR 80 | Ht 67.0 in | Wt 190.0 lb

## 2021-05-29 DIAGNOSIS — Z9889 Other specified postprocedural states: Secondary | ICD-10-CM | POA: Diagnosis not present

## 2021-05-29 DIAGNOSIS — E559 Vitamin D deficiency, unspecified: Secondary | ICD-10-CM | POA: Insufficient documentation

## 2021-05-29 DIAGNOSIS — G5139 Clonic hemifacial spasm, unspecified: Secondary | ICD-10-CM | POA: Diagnosis not present

## 2021-05-29 DIAGNOSIS — Z720 Tobacco use: Secondary | ICD-10-CM | POA: Insufficient documentation

## 2021-05-29 DIAGNOSIS — E1165 Type 2 diabetes mellitus with hyperglycemia: Secondary | ICD-10-CM | POA: Insufficient documentation

## 2021-05-29 DIAGNOSIS — M1712 Unilateral primary osteoarthritis, left knee: Secondary | ICD-10-CM | POA: Insufficient documentation

## 2021-05-29 DIAGNOSIS — L309 Dermatitis, unspecified: Secondary | ICD-10-CM | POA: Insufficient documentation

## 2021-05-29 DIAGNOSIS — E7211 Homocystinuria: Secondary | ICD-10-CM | POA: Insufficient documentation

## 2021-05-29 DIAGNOSIS — K5900 Constipation, unspecified: Secondary | ICD-10-CM | POA: Insufficient documentation

## 2021-05-29 DIAGNOSIS — G4700 Insomnia, unspecified: Secondary | ICD-10-CM | POA: Insufficient documentation

## 2021-05-29 DIAGNOSIS — I119 Hypertensive heart disease without heart failure: Secondary | ICD-10-CM | POA: Insufficient documentation

## 2021-05-29 DIAGNOSIS — Z8669 Personal history of other diseases of the nervous system and sense organs: Secondary | ICD-10-CM | POA: Insufficient documentation

## 2021-05-29 DIAGNOSIS — E890 Postprocedural hypothyroidism: Secondary | ICD-10-CM | POA: Insufficient documentation

## 2021-05-29 DIAGNOSIS — E119 Type 2 diabetes mellitus without complications: Secondary | ICD-10-CM | POA: Insufficient documentation

## 2021-05-29 DIAGNOSIS — Z86011 Personal history of benign neoplasm of the brain: Secondary | ICD-10-CM | POA: Insufficient documentation

## 2021-05-29 DIAGNOSIS — J309 Allergic rhinitis, unspecified: Secondary | ICD-10-CM | POA: Insufficient documentation

## 2021-05-29 DIAGNOSIS — D3A092 Benign carcinoid tumor of the stomach: Secondary | ICD-10-CM | POA: Insufficient documentation

## 2021-05-29 DIAGNOSIS — C801 Malignant (primary) neoplasm, unspecified: Secondary | ICD-10-CM | POA: Insufficient documentation

## 2021-05-29 MED ORDER — LAMOTRIGINE 25 MG PO TABS: ORAL_TABLET | ORAL | 11 refills | Status: DC

## 2021-05-29 NOTE — Telephone Encounter (Signed)
[employer] medicare/medicaid order sent to [facility], [facility] they will reach out to the patient to schedule.

## 2021-05-29 NOTE — Progress Notes (Signed)
Chief Complaint  Patient presents with   New Patient (Initial Visit)    Rm 15. NP/Paper/[facility]ather [facility]/worsening facial nerve spasms Pt c/o left sided facial nerve spasms and eye spasms. She previously had a tumor around 8 years ago on the same side that was benign. She denies any pain or headaches.        ASSESSMENT AND PLAN  Heather Wilcox is a 81 y.o. female   Status post left temporal meningioma craniotomy Left hemifacial spasm  MRI of the brain with without contrast to rule out structural abnormality  Try lamotrigine 25 mg titrating to 50 mg twice a day  Preauthorization for Xeomin 50 units, return in 4 to 6 weeks for EMG guided injection  DIAGNOSTIC DATA (LABS, IMAGING, TESTING) - I reviewed patient records, labs, notes, testing and imaging myself where available. Laboratory evaluations in December 2022: Normal creatinine 0.81, mildly decreased calcium 8.8, CBC hemoglobin of 10.9, RDW of 11.4  MEDICAL HISTORY:  Heather Wilcox is a 81 year old female, seen in request by   her primary care PA Heather Wilcox, for evaluation of left hemi-facial spasm, initial evaluation was on May 19, 2021.    I reviewed and summarized the referring note.  Past medical history Hypertension Diabetes History of colon cancer  She has a history of left temporal craniotomy for meningioma in 2006, denies focal deficit, denies seizure  Around 2018 she began to experience intermittent left facial muscle twitching, initially was intermittent, involving left surrounding eye muscle, gradually getting worse, more frequent spreading to her lower face, she denies sensory change, denies visual loss, denies hearing loss  I personally reviewed CT head without contrast in February 2018, no acute intracranial abnormality, mild supratentorium small vessel disease, chronic left encephalomalacia and left temporal lobe, PHYSICAL EXAM:   Vitals:   05/29/21 1402  BP: 136/86   Pulse: 80  Weight: 190 lb (86.2 kg)  Height: 5\' 7"  (1.702 m)   Not recorded     Body mass index is 29.76 kg/m.  PHYSICAL EXAMNIATION:  Gen: NAD, conversant, well nourised, well groomed                     Cardiovascular: Regular rate rhythm, no peripheral edema, warm, nontender. Eyes: Conjunctivae clear without exudates or hemorrhage Neck: Supple, no carotid bruits. Pulmonary: Clear to auscultation bilaterally   NEUROLOGICAL EXAM:  MENTAL STATUS: Speech:    Speech is normal; fluent and spontaneous with normal comprehension.  Cognition:     Orientation to time, place and person     Normal recent and remote memory     Normal Attention span and concentration     Normal Language, naming, repeating,spontaneous speech     Fund of knowledge   CRANIAL NERVES: CN II: Visual fields are full to confrontation. Pupils are round equal and briskly reactive to light. CN III, IV, VI: extraocular movement are normal. No ptosis. CN V: Facial sensation is intact to light touch, bilateral corneal reflex were symmetric CN VII: Frequent almost constant left orbicularis oculi, and left lower face muscle twitching, CN VIII: Hearing is normal to causal conversation. CN IX, X: Phonation is normal. CN XI: Head turning and shoulder shrug are intact  MOTOR: There is no pronator drift of out-stretched arms. Muscle bulk and tone are normal. Muscle strength is normal.  REFLEXES: Reflexes are 2+ and symmetric at the biceps, triceps, knees, and ankles. Plantar responses are flexor.  SENSORY: Intact to light touch,  pinprick and vibratory sensation are intact in fingers and toes.  COORDINATION: There is no trunk or limb dysmetria noted.  GAIT/STANCE: Need push-up to get up from seated position, cautious, steady Romberg is absent.  REVIEW OF SYSTEMS:  Full 14 system review of systems performed and notable only for as above All other review of systems were negative.   ALLERGIES: Allergies   Allergen Reactions   Meloxicam Other (See Comments)    Pt states it caused intestinal bleeding   Penicillin G Other (See Comments)    Other reaction(s): Unknown    HOME MEDICATIONS: Current Outpatient Medications  Medication Sig Dispense Refill   acetaminophen (TYLENOL) 325 MG tablet Take 2 tablets (650 mg total) by mouth every 6 (six) hours as needed for mild pain (or Fever >/= 101).     amLODipine (NORVASC) 10 MG tablet Take 1 tablet (10 mg total) by mouth daily. 90 tablet 1   atorvastatin (LIPITOR) 20 MG tablet Take 1 tablet (20 mg total) by mouth daily. 90 tablet 1   cholecalciferol (VITAMIN D) 1000 units tablet Take 1,000 Units by mouth daily.     cloNIDine (CATAPRES) 0.1 MG tablet Take 1 tablet (0.1 mg total) by mouth 2 (two) times daily. 60 tablet 11   folic acid (FOLVITE) 993 MCG tablet Take 400 mcg by mouth daily.     glipiZIDE (GLUCOTROL) 5 MG tablet Take 1 tablet by mouth 2 (two) times daily.     levothyroxine (SYNTHROID, LEVOTHROID) 75 MCG tablet Take 75 mcg by mouth daily before breakfast.     lisinopril-hydrochlorothiazide (ZESTORETIC) 20-12.5 MG tablet Take 1 tablet by mouth daily. 90 tablet 1   metFORMIN (GLUCOPHAGE) 500 MG tablet Take 1 tablet (500 mg total) by mouth 2 (two) times daily with a meal. 180 tablet 3   No current facility-administered medications for this visit.    PAST MEDICAL HISTORY: Past Medical History:  Diagnosis Date   Arthritis    "left knee" (10/24/2016)   Colon cancer ([place])    ADENOMA OF COLON AT HEPATIC FLEXURE/notes 10/24/2016   History of stomach ulcers    Hypertension    Hypothyroidism     PAST SURGICAL HISTORY: Past Surgical History:  Procedure Laterality Date   COLON SURGERY     COLONOSCOPY     CRANIOTOMY FOR TUMOR Left 04/2004    temporal craniotomy for meningioma with microdissection/notes 09/11/2010   INCISION AND DRAINAGE PERIRECTAL ABSCESS N/A 06/13/2016   Procedure: IRRIGATION AND DEBRIDEMENT PERIRECTAL ABSCESS;  Surgeon:  Autumn Messing III, MD;  Location: [facility];  Service: General;  Laterality: N/A;   LAPAROSCOPIC PARTIAL RIGHT COLECTOMY Right 10/24/2016   LAPAROSCOPIC RIGHT COLECTOMY Right 10/24/2016   Procedure: LAPAROSCOPIC ASSISTED RIGHT COLECTOMY;  Surgeon: Jovita Kussmaul, MD;  Location: [facility];  Service: General;  Laterality: Right;   REPAIR OF PERFORATED ULCER     "bleeding stomach ulcer"   TOTAL THYROIDECTOMY      FAMILY HISTORY: Family History  Problem Relation Age of Onset   Breast cancer Neg Hx     SOCIAL HISTORY: Social History   Socioeconomic History   Marital status: Widowed    Spouse name: Not on file   Number of children: Not on file   Years of education: Not on file   Highest education level: Not on file  Occupational History   Not on file  Tobacco Use   Smoking status: Some Days    Packs/day: 0.10    Years: 58.00    Pack years:  5.80    Types: Cigarettes   Smokeless tobacco: Never  Vaping Use   Vaping Use: Never used  Substance and Sexual Activity   Alcohol use: No   Drug use: No   Sexual activity: Never  Other Topics Concern   Not on file  Social History Narrative   Not on file   Social Determinants of Health   Financial Resource Strain: Not on file  Food Insecurity: Not on file  Transportation Needs: Not on file  Physical Activity: Not on file  Stress: Not on file  Social Connections: Not on file  Intimate Partner Violence: Not on file      Marcial Pacas, M.D. Ph.D.  [facility] [address], [place], [place] [address] Ph: [phone] Fax: [phone]  CC:  Heather Wilcox, [place],  [place] [address]  Heather Wilcox, [place]

## 2021-05-30 ENCOUNTER — Telehealth: Payer: Self-pay | Admitting: Neurology

## 2021-05-30 NOTE — Telephone Encounter (Signed)
Received charge sheet and patient consent. Completed PA form. Place in Nurse Pod for MD signature.

## 2021-05-30 NOTE — Telephone Encounter (Signed)
I called [employer] spoke with [phone]) she states a PA is required.

## 2021-05-31 NOTE — Telephone Encounter (Signed)
Denied by [facility] (part D). Attempt will be made to get it approved through medical benefits w/ [employer]. If approved through this avenue, she will have to use office supply. Rodman Pickle will work on this and provide an update.

## 2021-05-31 NOTE — Telephone Encounter (Signed)
Received Denial from [facility]. Xeomin is not a supported drug for Hemifacial spasms.

## 2021-06-13 ENCOUNTER — Ambulatory Visit
Admission: RE | Admit: 2021-06-13 | Discharge: 2021-06-13 | Disposition: A | Discharge status: Home / Self Care | Payer: Medicare Other | Source: Ambulatory Visit | Attending: Neurology | Admitting: Neurology

## 2021-06-13 ENCOUNTER — Other Ambulatory Visit: Payer: Self-pay

## 2021-06-13 DIAGNOSIS — Z9889 Other specified postprocedural states: Secondary | ICD-10-CM | POA: Diagnosis not present

## 2021-06-13 DIAGNOSIS — G5139 Clonic hemifacial spasm, unspecified: Secondary | ICD-10-CM

## 2021-06-13 MED ORDER — GADOBENATE DIMEGLUMINE 529 MG/ML IV SOLN
15.0000 mL | Freq: Once | INTRAVENOUS | Status: AC | PRN
  Administered 2021-06-13: 15 mL via INTRAVENOUS

## 2021-06-15 ENCOUNTER — Telehealth: Payer: Self-pay | Admitting: Neurology

## 2021-06-15 NOTE — Telephone Encounter (Signed)
°  MRI brain (with and without) demonstrating: - Left anterior temporal craniotomy and meningioma resection with expected postoperative changes, stable from 2007 MRI. - Mild periventricular subcortical foci of nonspecific gliosis. - Overall no significant change from 01/09/2006.   Please call patient, MRI of the brain showed postsurgical change, mild age-related changes, there was no acute abnormality.

## 2021-06-18 ENCOUNTER — Encounter: Payer: Self-pay | Admitting: *Deleted

## 2021-06-18 NOTE — Telephone Encounter (Addendum)
Attempted to reach patient once on 06/15/21 and twice on 06/18/21 (primary number [phone]). No answer either time. No voicemail set up. Recording states call cannot be completed at this time, try again later. No family member on DPR (only PCP listed). Not active on mychart. No pending appointments.   Hopefully, she will see the three missed calls from our office and call back. I will mail her a letter today.

## 2021-07-04 NOTE — Telephone Encounter (Signed)
Initiated PA on [facility] portal, approval is under review. ?

## 2021-07-05 NOTE — Telephone Encounter (Signed)
I wanted clarify that the patient has been approved for Botox 100 units because Xeomin was denied. ?

## 2021-07-05 NOTE — Telephone Encounter (Signed)
Please send Botox RX to [place].  ?

## 2021-07-05 NOTE — Telephone Encounter (Signed)
Received approval from [facility]. PA #  [id number] (07/04/2021-07/05/2022). ?

## 2021-07-05 NOTE — Telephone Encounter (Signed)
No I got the Xeomin approved finally, my bad I put Botox by accident. ?

## 2021-07-09 MED ORDER — XEOMIN 50 UNITS IM SOLR: 50.0000 [IU] | INTRAMUSCULAR | 3 refills | Status: DC

## 2021-07-09 NOTE — Telephone Encounter (Signed)
Is the approval for 50 or 100 units of Xeomin? ?

## 2021-07-09 NOTE — Addendum Note (Signed)
Addended by: Noberto Retort C on: 07/09/2021 11:58 AM ? ? Modules accepted: Orders ? ?

## 2021-07-09 NOTE — Telephone Encounter (Signed)
Rx for Xeomin 50 units sent to requested pharmacy.  ?

## 2021-07-09 NOTE — Telephone Encounter (Signed)
50 units

## 2021-07-12 DIAGNOSIS — E1165 Type 2 diabetes mellitus with hyperglycemia: Secondary | ICD-10-CM | POA: Diagnosis not present

## 2021-07-12 DIAGNOSIS — Z0000 Encounter for general adult medical examination without abnormal findings: Secondary | ICD-10-CM | POA: Diagnosis not present

## 2021-07-12 DIAGNOSIS — I10 Essential (primary) hypertension: Secondary | ICD-10-CM | POA: Diagnosis not present

## 2021-07-17 DIAGNOSIS — H02432 Paralytic ptosis of left eyelid: Secondary | ICD-10-CM | POA: Diagnosis not present

## 2021-08-08 DIAGNOSIS — G5139 Clonic hemifacial spasm, unspecified: Secondary | ICD-10-CM | POA: Diagnosis not present

## 2021-08-08 DIAGNOSIS — E039 Hypothyroidism, unspecified: Secondary | ICD-10-CM | POA: Diagnosis not present

## 2021-08-08 DIAGNOSIS — E119 Type 2 diabetes mellitus without complications: Secondary | ICD-10-CM | POA: Diagnosis not present

## 2021-08-08 DIAGNOSIS — Z720 Tobacco use: Secondary | ICD-10-CM | POA: Diagnosis not present

## 2021-08-08 DIAGNOSIS — I10 Essential (primary) hypertension: Secondary | ICD-10-CM | POA: Diagnosis not present

## 2021-09-27 DIAGNOSIS — G514 Facial myokymia: Secondary | ICD-10-CM | POA: Diagnosis not present

## 2021-11-07 DIAGNOSIS — E119 Type 2 diabetes mellitus without complications: Secondary | ICD-10-CM | POA: Diagnosis not present

## 2021-11-07 DIAGNOSIS — G5139 Clonic hemifacial spasm, unspecified: Secondary | ICD-10-CM | POA: Diagnosis not present

## 2021-11-07 DIAGNOSIS — Z720 Tobacco use: Secondary | ICD-10-CM | POA: Diagnosis not present

## 2021-11-07 DIAGNOSIS — E039 Hypothyroidism, unspecified: Secondary | ICD-10-CM | POA: Diagnosis not present

## 2021-11-07 DIAGNOSIS — I10 Essential (primary) hypertension: Secondary | ICD-10-CM | POA: Diagnosis not present

## 2021-11-21 DIAGNOSIS — E119 Type 2 diabetes mellitus without complications: Secondary | ICD-10-CM | POA: Diagnosis not present

## 2021-11-21 DIAGNOSIS — R771 Abnormality of globulin: Secondary | ICD-10-CM | POA: Diagnosis not present

## 2021-11-21 DIAGNOSIS — E039 Hypothyroidism, unspecified: Secondary | ICD-10-CM | POA: Diagnosis not present

## 2021-11-21 DIAGNOSIS — G5139 Clonic hemifacial spasm, unspecified: Secondary | ICD-10-CM | POA: Diagnosis not present

## 2021-11-21 DIAGNOSIS — I10 Essential (primary) hypertension: Secondary | ICD-10-CM | POA: Diagnosis not present

## 2021-11-21 DIAGNOSIS — Z720 Tobacco use: Secondary | ICD-10-CM | POA: Diagnosis not present

## 2021-12-05 ENCOUNTER — Ambulatory Visit: Payer: Medicare Other | Admitting: Podiatry

## 2021-12-05 DIAGNOSIS — G5139 Clonic hemifacial spasm, unspecified: Secondary | ICD-10-CM | POA: Diagnosis not present

## 2021-12-05 DIAGNOSIS — E119 Type 2 diabetes mellitus without complications: Secondary | ICD-10-CM | POA: Diagnosis not present

## 2021-12-05 DIAGNOSIS — Z0000 Encounter for general adult medical examination without abnormal findings: Secondary | ICD-10-CM | POA: Diagnosis not present

## 2021-12-05 DIAGNOSIS — I10 Essential (primary) hypertension: Secondary | ICD-10-CM | POA: Diagnosis not present

## 2021-12-05 DIAGNOSIS — R771 Abnormality of globulin: Secondary | ICD-10-CM | POA: Diagnosis not present

## 2021-12-05 DIAGNOSIS — E039 Hypothyroidism, unspecified: Secondary | ICD-10-CM | POA: Diagnosis not present

## 2021-12-05 DIAGNOSIS — Z720 Tobacco use: Secondary | ICD-10-CM | POA: Diagnosis not present

## 2021-12-19 ENCOUNTER — Encounter: Payer: Self-pay | Admitting: Podiatry

## 2021-12-19 ENCOUNTER — Ambulatory Visit (INDEPENDENT_AMBULATORY_CARE_PROVIDER_SITE_OTHER): Payer: Medicare Other | Admitting: Podiatry

## 2021-12-19 DIAGNOSIS — E119 Type 2 diabetes mellitus without complications: Secondary | ICD-10-CM | POA: Diagnosis not present

## 2021-12-19 DIAGNOSIS — M79675 Pain in left toe(s): Secondary | ICD-10-CM | POA: Diagnosis not present

## 2021-12-19 DIAGNOSIS — B351 Tinea unguium: Secondary | ICD-10-CM | POA: Diagnosis not present

## 2021-12-19 DIAGNOSIS — M79674 Pain in right toe(s): Secondary | ICD-10-CM | POA: Diagnosis not present

## 2021-12-19 NOTE — Progress Notes (Signed)
  Subjective:  Patient ID: Heather Wilcox, female    DOB: [birth year],   MRN: [id number]  Chief Complaint  Patient presents with   Nail Problem     Routine foot care    81 y.o. female presents for concern of thickened elongated and painful nails that are difficult to trim as well as calluses Requesting to have them trimmed today. Relates burning and tingling in their feet. Patient is diabetic and last A1c was  Lab Results  Component Value Date   HGBA1C 10.8 (H) 04/19/2021   .   PCP:  Trey Sailors, PA    . Denies any other pedal complaints. Denies n/v/f/c.   Past Medical History:  Diagnosis Date   Arthritis    "left knee" (10/24/2016)   Colon cancer ([place])    ADENOMA OF COLON AT HEPATIC FLEXURE/notes 10/24/2016   History of stomach ulcers    Hypertension    Hypothyroidism     Objective:  Physical Exam: Vascular: DP/PT pulses 2/4 bilateral. CFT <3 seconds. Absent hair growth on digits. Edema noted to bilateral lower extremities. Xerosis noted bilaterally.  Skin. No lacerations or abrasions bilateral feet. Nails 1-5 bilateral  are thickened discolored and elongated with subungual debris.  Musculoskeletal: MMT 5/5 bilateral lower extremities in DF, PF, Inversion and Eversion. Deceased ROM in DF of ankle joint.  Neurological: Sensation intact to light touch. Protective sensation diminished bilateral.    Assessment:   1. Type 2 diabetes mellitus without complication, unspecified whether long term insulin use (HCC)   2. Pain due to onychomycosis of toenails of both feet   3. Corns and callosities      Plan:  Patient was evaluated and treated and all questions answered. -Discussed and educated patient on diabetic foot care, especially with  regards to the vascular, neurological and musculoskeletal systems.  -Stressed the importance of good glycemic control and the detriment of not  controlling glucose levels in relation to the foot. -Discussed supportive shoes at all  times and checking feet regularly.  -Mechanically debrided all nails 1-5 bilateral using sterile nail nipper and filed with dremel without incident  -Answered all patient questions -Patient to return  in 3 months for at risk foot care -Patient advised to call the office if any problems or questions arise in the meantime.   Lorenda Peck, DPM

## 2022-02-05 ENCOUNTER — Other Ambulatory Visit: Payer: Self-pay | Admitting: Physician Assistant

## 2022-02-05 DIAGNOSIS — Z1231 Encounter for screening mammogram for malignant neoplasm of breast: Secondary | ICD-10-CM

## 2022-02-11 ENCOUNTER — Ambulatory Visit: Payer: Self-pay | Admitting: Licensed Clinical Social Worker

## 2022-02-11 NOTE — Patient Outreach (Signed)
  Care Coordination   Initial Visit Note   02/11/2022 Name: Heather Wilcox MRN: [id number] DOB: 05/20/1940  Heather Wilcox is a 81 y.o. year old female who sees Trey Sailors, [place] for primary care. I spoke with  Heather Wilcox by phone today.  What matters to the patients health and wellness today?  Client declined program services   Goals Addressed               This Visit's Progress     Patient was given Care Coordination information . She declined program services (pt-stated)        Interventions Client was given Care coordination program services information Client received above information but declined Care Coordination services     SDOH assessments and interventions completed:  Yes    Care Coordination Interventions Activated:  Yes  Care Coordination Interventions:  Yes, provided   Follow up plan: No further intervention required.   Encounter Outcome:  Pt. Visit Completed

## 2022-02-11 NOTE — Patient Instructions (Signed)
Visit Information  Thank you for taking time to visit with me today. Please don't hesitate to contact me if I can be of assistance to you before our next scheduled telephone appointment.  Following are the goals we discussed today:   No further intervention needed  Please call the care guide team at [phone] if you need to cancel or reschedule your appointment.   If you are experiencing a Mental Health or [place] or need someone to talk to, please go to [facility] [place] [phone])   Following is a copy of your full plan of care:   Interventions Client was given Care coordination program services information Client received above information but declined Care Coordination services  Ms. Kerwood was given information about Care Management services by the embedded care coordination team including:  Care Management services include personalized support from designated clinical staff supervised by her physician, including individualized plan of care and coordination with other care providers 24/7 contact phone numbers for assistance for urgent and routine care needs. The patient may stop CCM services at any time (effective at the end of the month) by phone call to the office staff.  Patient agreed to services and verbal consent obtained.   Norva Riffle.Emelia Sandoval MSW, [place] [facility] representative [facility] Care Management [phone]

## 2022-03-06 DIAGNOSIS — Z720 Tobacco use: Secondary | ICD-10-CM | POA: Diagnosis not present

## 2022-03-06 DIAGNOSIS — I10 Essential (primary) hypertension: Secondary | ICD-10-CM | POA: Diagnosis not present

## 2022-03-06 DIAGNOSIS — E119 Type 2 diabetes mellitus without complications: Secondary | ICD-10-CM | POA: Diagnosis not present

## 2022-03-06 DIAGNOSIS — E039 Hypothyroidism, unspecified: Secondary | ICD-10-CM | POA: Diagnosis not present

## 2022-03-15 ENCOUNTER — Ambulatory Visit
Admission: RE | Admit: 2022-03-15 | Discharge: 2022-03-15 | Disposition: A | Discharge status: Home / Self Care | Payer: Medicare Other | Source: Ambulatory Visit | Attending: Physician Assistant | Admitting: Physician Assistant

## 2022-03-15 ENCOUNTER — Ambulatory Visit: Payer: Medicare Other

## 2022-03-15 DIAGNOSIS — Z1231 Encounter for screening mammogram for malignant neoplasm of breast: Secondary | ICD-10-CM

## 2022-04-03 ENCOUNTER — Ambulatory Visit: Payer: Medicare Other | Admitting: Podiatry

## 2022-04-11 DIAGNOSIS — I10 Essential (primary) hypertension: Secondary | ICD-10-CM | POA: Diagnosis not present

## 2022-04-11 DIAGNOSIS — Z720 Tobacco use: Secondary | ICD-10-CM | POA: Diagnosis not present

## 2022-04-11 DIAGNOSIS — E119 Type 2 diabetes mellitus without complications: Secondary | ICD-10-CM | POA: Diagnosis not present

## 2022-04-11 DIAGNOSIS — E1165 Type 2 diabetes mellitus with hyperglycemia: Secondary | ICD-10-CM | POA: Diagnosis not present

## 2022-04-11 DIAGNOSIS — E039 Hypothyroidism, unspecified: Secondary | ICD-10-CM | POA: Diagnosis not present

## 2022-05-29 DIAGNOSIS — E119 Type 2 diabetes mellitus without complications: Secondary | ICD-10-CM | POA: Diagnosis not present

## 2022-05-29 DIAGNOSIS — I119 Hypertensive heart disease without heart failure: Secondary | ICD-10-CM | POA: Diagnosis not present

## 2022-07-15 DIAGNOSIS — I10 Essential (primary) hypertension: Secondary | ICD-10-CM | POA: Diagnosis not present

## 2022-07-15 DIAGNOSIS — Z720 Tobacco use: Secondary | ICD-10-CM | POA: Diagnosis not present

## 2022-07-15 DIAGNOSIS — E039 Hypothyroidism, unspecified: Secondary | ICD-10-CM | POA: Diagnosis not present

## 2022-07-15 DIAGNOSIS — E119 Type 2 diabetes mellitus without complications: Secondary | ICD-10-CM | POA: Diagnosis not present

## 2022-07-15 DIAGNOSIS — Z0001 Encounter for general adult medical examination with abnormal findings: Secondary | ICD-10-CM | POA: Diagnosis not present

## 2022-07-15 DIAGNOSIS — G5139 Clonic hemifacial spasm, unspecified: Secondary | ICD-10-CM | POA: Diagnosis not present

## 2022-08-08 DIAGNOSIS — E119 Type 2 diabetes mellitus without complications: Secondary | ICD-10-CM | POA: Diagnosis not present

## 2022-10-01 DIAGNOSIS — H04122 Dry eye syndrome of left lacrimal gland: Secondary | ICD-10-CM | POA: Diagnosis not present

## 2022-11-06 DIAGNOSIS — E1165 Type 2 diabetes mellitus with hyperglycemia: Secondary | ICD-10-CM | POA: Diagnosis not present

## 2022-11-06 DIAGNOSIS — E119 Type 2 diabetes mellitus without complications: Secondary | ICD-10-CM | POA: Diagnosis not present

## 2022-11-06 DIAGNOSIS — Z0001 Encounter for general adult medical examination with abnormal findings: Secondary | ICD-10-CM | POA: Diagnosis not present

## 2022-11-06 DIAGNOSIS — E039 Hypothyroidism, unspecified: Secondary | ICD-10-CM | POA: Diagnosis not present

## 2022-11-06 DIAGNOSIS — I10 Essential (primary) hypertension: Secondary | ICD-10-CM | POA: Diagnosis not present

## 2022-11-06 DIAGNOSIS — Z720 Tobacco use: Secondary | ICD-10-CM | POA: Diagnosis not present

## 2022-11-12 DIAGNOSIS — R52 Pain, unspecified: Secondary | ICD-10-CM | POA: Diagnosis not present

## 2022-11-12 DIAGNOSIS — K6289 Other specified diseases of anus and rectum: Secondary | ICD-10-CM | POA: Diagnosis not present

## 2022-11-12 DIAGNOSIS — B372 Candidiasis of skin and nail: Secondary | ICD-10-CM | POA: Diagnosis not present

## 2022-11-20 DIAGNOSIS — B372 Candidiasis of skin and nail: Secondary | ICD-10-CM | POA: Diagnosis not present

## 2022-11-20 DIAGNOSIS — E1165 Type 2 diabetes mellitus with hyperglycemia: Secondary | ICD-10-CM | POA: Diagnosis not present

## 2022-11-27 DIAGNOSIS — E1165 Type 2 diabetes mellitus with hyperglycemia: Secondary | ICD-10-CM | POA: Diagnosis not present

## 2022-11-27 DIAGNOSIS — R062 Wheezing: Secondary | ICD-10-CM | POA: Diagnosis not present

## 2022-12-02 DIAGNOSIS — H9193 Unspecified hearing loss, bilateral: Secondary | ICD-10-CM | POA: Diagnosis not present

## 2022-12-03 DIAGNOSIS — H11422 Conjunctival edema, left eye: Secondary | ICD-10-CM | POA: Diagnosis not present

## 2022-12-20 DIAGNOSIS — H11422 Conjunctival edema, left eye: Secondary | ICD-10-CM | POA: Diagnosis not present

## 2022-12-23 DIAGNOSIS — G5132 Clonic hemifacial spasm, left: Secondary | ICD-10-CM | POA: Diagnosis not present

## 2023-02-12 DIAGNOSIS — I10 Essential (primary) hypertension: Secondary | ICD-10-CM | POA: Diagnosis not present

## 2023-02-12 DIAGNOSIS — E1165 Type 2 diabetes mellitus with hyperglycemia: Secondary | ICD-10-CM | POA: Diagnosis not present

## 2023-02-12 DIAGNOSIS — G5139 Clonic hemifacial spasm, unspecified: Secondary | ICD-10-CM | POA: Diagnosis not present

## 2023-02-12 DIAGNOSIS — Z720 Tobacco use: Secondary | ICD-10-CM | POA: Diagnosis not present

## 2023-02-12 DIAGNOSIS — E039 Hypothyroidism, unspecified: Secondary | ICD-10-CM | POA: Diagnosis not present

## 2023-02-18 ENCOUNTER — Other Ambulatory Visit: Payer: Self-pay | Admitting: Physician Assistant

## 2023-02-18 DIAGNOSIS — Z1231 Encounter for screening mammogram for malignant neoplasm of breast: Secondary | ICD-10-CM

## 2023-03-13 ENCOUNTER — Ambulatory Visit: Payer: Medicare Other

## 2023-03-17 ENCOUNTER — Telehealth: Payer: Self-pay

## 2023-03-17 ENCOUNTER — Encounter: Payer: Self-pay | Admitting: Neurology

## 2023-03-17 ENCOUNTER — Ambulatory Visit (INDEPENDENT_AMBULATORY_CARE_PROVIDER_SITE_OTHER): Payer: 59 | Admitting: Neurology

## 2023-03-17 VITALS — BP 152/90 | HR 84 | Ht 67.0 in | Wt 201.5 lb

## 2023-03-17 DIAGNOSIS — G5139 Clonic hemifacial spasm, unspecified: Secondary | ICD-10-CM

## 2023-03-17 DIAGNOSIS — G5133 Clonic hemifacial spasm, bilateral: Secondary | ICD-10-CM

## 2023-03-17 MED ORDER — INCOBOTULINUMTOXINA 50 UNITS IM SOLR: 50.0000 [IU] | INTRAMUSCULAR | Status: AC

## 2023-03-17 NOTE — Telephone Encounter (Signed)
Xeomin Start (or Restart)  [id number] 50 units EMG guidance  CPT 64612 G51.3

## 2023-03-17 NOTE — Telephone Encounter (Signed)
Started auth via [employer] portal, saving as a draft until OV notes are finished and ready to be uploaded.

## 2023-03-17 NOTE — Progress Notes (Signed)
Chief Complaint  Patient presents with   facial spasms    Rm14, alone, Facial spasms:pt stated that they are getting progressively worse since last visit and she would like to proceed w/injections      ASSESSMENT AND PLAN  Heather Wilcox is a 82 y.o. female   Status post left temporal meningioma craniotomy   MRI of the brain with without contrast in February 2023 showed evidence of previous left anterior temporal craniotomy and meningioma resection with expected postoperative change, mild small vessel disease,  Left hemifacial spasm under EMG guidance, Used  50 units of xeomin sample,     DIAGNOSTIC DATA (LABS, IMAGING, TESTING) - I reviewed patient records, labs, notes, testing and imaging myself where available. Laboratory evaluations in December 2022: Normal creatinine 0.81, mildly decreased calcium 8.8, CBC hemoglobin of 10.9, RDW of 11.4  MEDICAL HISTORY:  Heather Wilcox is a 82 year old female, seen in request by   her primary care PA Norm Salt, for evaluation of left hemi-facial spasm, initial evaluation was on May 19, 2021.    I reviewed and summarized the referring note.  Past medical history Hypertension Diabetes History of colon cancer  She has a history of left temporal craniotomy for meningioma in 2006, denies focal deficit, denies seizure  Around 2018 she began to experience intermittent left facial muscle twitching, initially was intermittent, involving left surrounding eye muscle, gradually getting worse, more frequent spreading to her lower face, she denies sensory change, denies visual loss, denies hearing loss  I personally reviewed CT head without contrast in February 2018, no acute intracranial abnormality, mild supratentorium small vessel disease, chronic left encephalomalacia and left temporal lobe,  UPDATE Mar 17 2023: MRI of the brain with without contrast from February 2023 showed postsurgical change of left anterior temporal  craniotomy and meningioma resection, stable, mild small vessel disease  She lost follow-up since last visit, today complains of worsening left hemifacial spasm, present 75% of time, blocking her left vision, difficulty driving, reading, she denies visual loss, no dysarthria, remain active at her church,   PHYSICAL EXAM:   Vitals:   03/17/23 0906  BP: (!) 152/90  Pulse: 84  Weight: 201 lb 8 oz (91.4 kg)  Height: 5\' 7"  (1.702 m)     Body mass index is 31.56 kg/m.  PHYSICAL EXAMNIATION:  Gen: NAD, conversant, well nourised, well groomed                     Cardiovascular: Regular rate rhythm, no peripheral edema, warm, nontender. Eyes: Conjunctivae clear without exudates or hemorrhage Neck: Supple, no carotid bruits. Pulmonary: Clear to auscultation bilaterally   NEUROLOGICAL EXAM:  MENTAL STATUS: Speech/cognition: Awake, alert, oriented to history taking and casual conversation, CRANIAL NERVES: CN II: Visual fields are full to confrontation. Pupils are round equal and briskly reactive to light. CN III, IV, VI: extraocular movement are normal. No ptosis. CN V: Facial sensation is intact to light touch, bilateral corneal reflex were symmetric CN VII: Frequent almost constant left orbicularis oculi, and left lower face muscle twitching, CN VIII: Hearing is normal to causal conversation. CN IX, X: Phonation is normal. CN XI: Head turning and shoulder shrug are intact  MOTOR: There is no pronator drift of out-stretched arms. Muscle bulk and tone are normal. Muscle strength is normal.  REFLEXES: Reflexes are 2+ and symmetric at the biceps, triceps, knees, and ankles. Plantar responses are flexor.  SENSORY: Intact to light touch, pinprick and vibratory sensation  are intact in fingers and toes.  COORDINATION: There is no trunk or limb dysmetria noted.  GAIT/STANCE: Need push-up to get up from seated position, cautious, steady Romberg is absent.  REVIEW OF SYSTEMS:  Full  14 system review of systems performed and notable only for as above All other review of systems were negative.   ALLERGIES: Allergies  Allergen Reactions   Meloxicam Other (See Comments)    Pt states it caused intestinal bleeding   Penicillin G Other (See Comments)    Other reaction(s): Unknown    HOME MEDICATIONS: Current Outpatient Medications  Medication Sig Dispense Refill   acetaminophen (TYLENOL) 325 MG tablet Take 2 tablets (650 mg total) by mouth every 6 (six) hours as needed for mild pain (or Fever >/= 101).     amLODipine (NORVASC) 10 MG tablet Take 1 tablet (10 mg total) by mouth daily. 90 tablet 1   cholecalciferol (VITAMIN D) 1000 units tablet Take 1,000 Units by mouth daily.     cloNIDine (CATAPRES) 0.1 MG tablet Take 1 tablet (0.1 mg total) by mouth 2 (two) times daily. 60 tablet 11   folic acid (FOLVITE) 400 MCG tablet Take 400 mcg by mouth daily.     incobotulinumtoxinA (XEOMIN) 50 units SOLR injection Inject 50 Units into the muscle every 3 (three) months. 1 each 3   lamoTRIgine (LAMICTAL) 25 MG tablet One tab twice a day x one week 2 tabs twice a day 120 tablet 11   levothyroxine (SYNTHROID, LEVOTHROID) 75 MCG tablet Take 75 mcg by mouth daily before breakfast.     lisinopril-hydrochlorothiazide (ZESTORETIC) 20-12.5 MG tablet Take 1 tablet by mouth daily. 90 tablet 1   glipiZIDE (GLUCOTROL) 5 MG tablet Take 1 tablet by mouth 2 (two) times daily.     No current facility-administered medications for this visit.    PAST MEDICAL HISTORY: Past Medical History:  Diagnosis Date   Arthritis    "left knee" (10/24/2016)   Colon cancer (HCC)    ADENOMA OF COLON AT HEPATIC FLEXURE/notes 10/24/2016   History of stomach ulcers    Hypertension    Hypothyroidism     PAST SURGICAL HISTORY: Past Surgical History:  Procedure Laterality Date   COLON SURGERY     COLONOSCOPY     CRANIOTOMY FOR TUMOR Left 04/2004    temporal craniotomy for meningioma with  microdissection/notes 09/11/2010   INCISION AND DRAINAGE PERIRECTAL ABSCESS N/A 06/13/2016   Procedure: IRRIGATION AND DEBRIDEMENT PERIRECTAL ABSCESS;  Surgeon: Chevis Pretty III, MD;  Location: [facility];  Service: General;  Laterality: N/A;   LAPAROSCOPIC PARTIAL RIGHT COLECTOMY Right 10/24/2016   LAPAROSCOPIC RIGHT COLECTOMY Right 10/24/2016   Procedure: LAPAROSCOPIC ASSISTED RIGHT COLECTOMY;  Surgeon: Griselda Miner, MD;  Location: [facility];  Service: General;  Laterality: Right;   REPAIR OF PERFORATED ULCER     "bleeding stomach ulcer"   TOTAL THYROIDECTOMY      FAMILY HISTORY: Family History  Problem Relation Age of Onset   Breast cancer Neg Hx     SOCIAL HISTORY: Social History   Socioeconomic History   Marital status: Widowed    Spouse name: Not on file   Number of children: Not on file   Years of education: Not on file   Highest education level: Not on file  Occupational History   Not on file  Tobacco Use   Smoking status: Some Days    Current packs/day: 0.10    Average packs/day: 0.1 packs/day for 58.0 years (5.8 ttl pk-yrs)  Types: Cigarettes   Smokeless tobacco: Never  Vaping Use   Vaping status: Never Used  Substance and Sexual Activity   Alcohol use: No   Drug use: No   Sexual activity: Never  Other Topics Concern   Not on file  Social History Narrative   Not on file   Social Determinants of Health   Financial Resource Strain: Not on file  Food Insecurity: Not on file  Transportation Needs: Not on file  Physical Activity: Not on file  Stress: Not on file  Social Connections: Not on file  Intimate Partner Violence: Not on file      Levert Feinstein, M.D. Ph.D.  [facility] [address], [address] [place], [place] [address] Ph: [phone] Fax: [phone]  CC:  Norm Salt, [place] [address] [place],  [place] [address]  Norm Salt, [place]

## 2023-03-17 NOTE — Progress Notes (Signed)
xeomin 50 units x 1 vial  [id number] [phone] Exp-01.2027 B/B Bacteriostatic 0.9% Sodium Chloride- 1mL  [id number] Expiration: 04.01.2025 NDC: [id number] Dx: G51.39 WITNESSED BY: Cinda Quest, CMA

## 2023-03-18 NOTE — Telephone Encounter (Signed)
Submitted auth via [employer] portal, status is pending. Request # [id number].

## 2023-03-19 NOTE — Telephone Encounter (Signed)
Auth was approved, please send rx to [facility]. Thank you!  Auth#: [id number] (03/18/23-03/17/24)

## 2023-04-02 DIAGNOSIS — G5139 Clonic hemifacial spasm, unspecified: Secondary | ICD-10-CM | POA: Diagnosis not present

## 2023-04-02 DIAGNOSIS — I10 Essential (primary) hypertension: Secondary | ICD-10-CM | POA: Diagnosis not present

## 2023-04-02 DIAGNOSIS — E039 Hypothyroidism, unspecified: Secondary | ICD-10-CM | POA: Diagnosis not present

## 2023-04-02 DIAGNOSIS — Z720 Tobacco use: Secondary | ICD-10-CM | POA: Diagnosis not present

## 2023-04-02 DIAGNOSIS — E1165 Type 2 diabetes mellitus with hyperglycemia: Secondary | ICD-10-CM | POA: Diagnosis not present

## 2023-04-16 ENCOUNTER — Ambulatory Visit
Admission: RE | Admit: 2023-04-16 | Discharge: 2023-04-16 | Disposition: A | Discharge status: Home / Self Care | Payer: 59 | Source: Ambulatory Visit | Attending: Physician Assistant | Admitting: Physician Assistant

## 2023-04-16 DIAGNOSIS — Z1231 Encounter for screening mammogram for malignant neoplasm of breast: Secondary | ICD-10-CM | POA: Diagnosis not present

## 2023-05-20 DIAGNOSIS — E1165 Type 2 diabetes mellitus with hyperglycemia: Secondary | ICD-10-CM | POA: Diagnosis not present

## 2023-05-20 DIAGNOSIS — M1611 Unilateral primary osteoarthritis, right hip: Secondary | ICD-10-CM | POA: Diagnosis not present

## 2023-05-20 DIAGNOSIS — G5139 Clonic hemifacial spasm, unspecified: Secondary | ICD-10-CM | POA: Diagnosis not present

## 2023-05-20 DIAGNOSIS — E039 Hypothyroidism, unspecified: Secondary | ICD-10-CM | POA: Diagnosis not present

## 2023-05-20 DIAGNOSIS — M25551 Pain in right hip: Secondary | ICD-10-CM | POA: Diagnosis not present

## 2023-05-20 DIAGNOSIS — I10 Essential (primary) hypertension: Secondary | ICD-10-CM | POA: Diagnosis not present

## 2023-05-20 DIAGNOSIS — Z720 Tobacco use: Secondary | ICD-10-CM | POA: Diagnosis not present

## 2023-05-26 DIAGNOSIS — D539 Nutritional anemia, unspecified: Secondary | ICD-10-CM | POA: Diagnosis not present

## 2023-05-26 DIAGNOSIS — Z79899 Other long term (current) drug therapy: Secondary | ICD-10-CM | POA: Diagnosis not present

## 2023-05-26 DIAGNOSIS — M129 Arthropathy, unspecified: Secondary | ICD-10-CM | POA: Diagnosis not present

## 2023-05-26 DIAGNOSIS — E559 Vitamin D deficiency, unspecified: Secondary | ICD-10-CM | POA: Diagnosis not present

## 2023-05-26 DIAGNOSIS — M5387 Other specified dorsopathies, lumbosacral region: Secondary | ICD-10-CM | POA: Diagnosis not present

## 2023-05-27 DIAGNOSIS — Z79899 Other long term (current) drug therapy: Secondary | ICD-10-CM | POA: Diagnosis not present

## 2023-06-04 MED ORDER — XEOMIN 50 UNITS IM SOLR: 50.0000 [IU] | INTRAMUSCULAR | 3 refills | Status: DC

## 2023-06-04 NOTE — Telephone Encounter (Signed)
 Refilled as requested

## 2023-06-04 NOTE — Telephone Encounter (Signed)
 Xeomin  rx wasn't received by [facility]. Can you resend please? Thank you!

## 2023-06-04 NOTE — Addendum Note (Signed)
 Addended by: Randi Buster on: 06/04/2023 12:52 PM   Modules accepted: Orders

## 2023-06-10 ENCOUNTER — Telehealth: Payer: Self-pay | Admitting: Neurology

## 2023-06-10 NOTE — Telephone Encounter (Signed)
Appointment details confirmed

## 2023-06-12 NOTE — Telephone Encounter (Signed)
Called [facility] to see if I could set up delivery, rep states medical benefit investigation is needed. She has requested this to be done urgently and advised me to call back on Monday.

## 2023-06-16 NOTE — Telephone Encounter (Signed)
Called [facility] to check status again, rep states that they are requiring PA through pharmacy benefit. Informed rep that PA is denied through pharmacy and that they needed to go through medical benefit, she states they're not able to do this.  I resubmitted auth to medical benefit for buy/bill, received instant approval.  Auth#: [id number] (06/16/23-06/15/24)

## 2023-06-17 ENCOUNTER — Ambulatory Visit (INDEPENDENT_AMBULATORY_CARE_PROVIDER_SITE_OTHER): Payer: 59 | Admitting: Neurology

## 2023-06-17 DIAGNOSIS — G5139 Clonic hemifacial spasm, unspecified: Secondary | ICD-10-CM | POA: Diagnosis not present

## 2023-06-17 DIAGNOSIS — G5133 Clonic hemifacial spasm, bilateral: Secondary | ICD-10-CM | POA: Diagnosis not present

## 2023-06-17 MED ORDER — INCOBOTULINUMTOXINA 50 UNITS IM SOLR
50.0000 [IU] | INTRAMUSCULAR | Status: AC
  Administered 2023-06-17: 50 [IU] via INTRAMUSCULAR

## 2023-06-17 NOTE — Progress Notes (Signed)
Chief Complaint  Patient presents with   Injections    Pt in 14, here alone Pt is here for Xeomin injection.       ASSESSMENT AND PLAN  Heather Wilcox is a 83 y.o. female   Status post left temporal meningioma craniotomy   MRI of the brain with without contrast in February 2023 showed evidence of previous left anterior temporal craniotomy and meningioma resection with expected postoperative change, mild small vessel disease,  Left hemifacial spasm under EMG guidance, Used  50 units of xeomin sample,   2.5 unitsx20-50 units    DIAGNOSTIC DATA (LABS, IMAGING, TESTING) - I reviewed patient records, labs, notes, testing and imaging myself where available. Laboratory evaluations in December 2022: Normal creatinine 0.81, mildly decreased calcium 8.8, CBC hemoglobin of 10.9, RDW of 11.4  MEDICAL HISTORY:  Heather Wilcox is a 83 year old female, seen in request by   her primary care PA Norm Salt, for evaluation of left hemi-facial spasm, initial evaluation was on May 19, 2021.    I reviewed and summarized the referring note.  Past medical history Hypertension Diabetes History of colon cancer  She has a history of left temporal craniotomy for meningioma in 2006, denies focal deficit, denies seizure  Around 2018 she began to experience intermittent left facial muscle twitching, initially was intermittent, involving left surrounding eye muscle, gradually getting worse, more frequent spreading to her lower face, she denies sensory change, denies visual loss, denies hearing loss  I personally reviewed CT head without contrast in February 2018, no acute intracranial abnormality, mild supratentorium small vessel disease, chronic left encephalomalacia and left temporal lobe,  UPDATE Mar 17 2023: MRI of the brain with without contrast from February 2023 showed postsurgical change of left anterior temporal craniotomy and meningioma resection, stable, mild small vessel  disease  She lost follow-up since last visit, today complains of worsening left hemifacial spasm, present 75% of time, blocking her left vision, difficulty driving, reading, she denies visual loss, no dysarthria, remain active at her church,  UPDATE Jun 17 2023: She did well with previous injection, no significant side effect noted  PHYSICAL EXAM:      03/17/2023    9:06 AM 05/29/2021    2:02 PM 04/21/2021   12:29 PM  Vitals with BMI  Height 5\' 7"  5\' 7"    Weight 201 lbs 8 oz 190 lbs   BMI 31.55 29.75   Systolic 152 136 161  Diastolic 90 86 96  Pulse 84 80 100     PHYSICAL EXAMNIATION:  Gen: NAD, conversant, well nourised, well groomed                     Cardiovascular: Regular rate rhythm, no peripheral edema, warm, nontender. Eyes: Conjunctivae clear without exudates or hemorrhage Neck: Supple, no carotid bruits. Pulmonary: Clear to auscultation bilaterally   NEUROLOGICAL EXAM:  MENTAL STATUS: Speech/cognition: Awake, alert, oriented to history taking and casual conversation, CRANIAL NERVES: CN II: Visual fields are full to confrontation. Pupils are round equal and briskly reactive to light. CN III, IV, VI: extraocular movement are normal. No ptosis. CN V: Facial sensation is intact to light touch, bilateral corneal reflex were symmetric CN VII: Frequent almost constant left orbicularis oculi, and left lower face muscle twitching, CN VIII: Hearing is normal to causal conversation. CN IX, X: Phonation is normal. CN XI: Head turning and shoulder shrug are intact  MOTOR: There is no pronator drift of out-stretched arms. Muscle bulk  and tone are normal. Muscle strength is normal.  REFLEXES: Reflexes are 2+ and symmetric at the biceps, triceps, knees, and ankles. Plantar responses are flexor.  SENSORY: Intact to light touch, pinprick and vibratory sensation are intact in fingers and toes.  COORDINATION: There is no trunk or limb dysmetria noted.  GAIT/STANCE: Need  push-up to get up from seated position, cautious, steady Romberg is absent.  REVIEW OF SYSTEMS:  Full 14 system review of systems performed and notable only for as above All other review of systems were negative.   ALLERGIES: Allergies  Allergen Reactions   Meloxicam Other (See Comments)    Pt states it caused intestinal bleeding   Penicillin G Other (See Comments)    Other reaction(s): Unknown    HOME MEDICATIONS: Current Outpatient Medications  Medication Sig Dispense Refill   acetaminophen (TYLENOL) 325 MG tablet Take 2 tablets (650 mg total) by mouth every 6 (six) hours as needed for mild pain (or Fever >/= 101).     amLODipine (NORVASC) 10 MG tablet Take 1 tablet (10 mg total) by mouth daily. 90 tablet 1   cholecalciferol (VITAMIN D) 1000 units tablet Take 1,000 Units by mouth daily.     cloNIDine (CATAPRES) 0.1 MG tablet Take 1 tablet (0.1 mg total) by mouth 2 (two) times daily. 60 tablet 11   folic acid (FOLVITE) 400 MCG tablet Take 400 mcg by mouth daily.     incobotulinumtoxinA (XEOMIN) 50 units SOLR injection Inject 50 Units into the muscle every 3 (three) months. 1 each 3   lamoTRIgine (LAMICTAL) 25 MG tablet One tab twice a day x one week 2 tabs twice a day 120 tablet 11   levothyroxine (SYNTHROID, LEVOTHROID) 75 MCG tablet Take 75 mcg by mouth daily before breakfast.     lisinopril-hydrochlorothiazide (ZESTORETIC) 20-12.5 MG tablet Take 1 tablet by mouth daily. 90 tablet 1   meloxicam (MOBIC) 15 MG tablet Take 15 mg by mouth daily.     traMADol (ULTRAM) 50 MG tablet Take 50 mg by mouth 2 (two) times daily as needed.     glipiZIDE (GLUCOTROL) 5 MG tablet Take 1 tablet by mouth 2 (two) times daily.     Current Facility-Administered Medications  Medication Dose Route Frequency Provider Last Rate Last Admin   incobotulinumtoxinA (XEOMIN) 50 units injection 50 Units  50 Units Intramuscular Q90 days Levert Feinstein, MD       incobotulinumtoxinA [facility]) 50 units injection 50  Units  50 Units Intramuscular Q90 days Levert Feinstein, MD        PAST MEDICAL HISTORY: Past Medical History:  Diagnosis Date   Arthritis    "left knee" (10/24/2016)   Colon cancer (HCC)    ADENOMA OF COLON AT HEPATIC FLEXURE/notes 10/24/2016   History of stomach ulcers    Hypertension    Hypothyroidism     PAST SURGICAL HISTORY: Past Surgical History:  Procedure Laterality Date   COLON SURGERY     COLONOSCOPY     CRANIOTOMY FOR TUMOR Left 04/2004    temporal craniotomy for meningioma with microdissection/notes 09/11/2010   INCISION AND DRAINAGE PERIRECTAL ABSCESS N/A 06/13/2016   Procedure: IRRIGATION AND DEBRIDEMENT PERIRECTAL ABSCESS;  Surgeon: Chevis Pretty III, MD;  Location: [facility];  Service: General;  Laterality: N/A;   LAPAROSCOPIC PARTIAL RIGHT COLECTOMY Right 10/24/2016   LAPAROSCOPIC RIGHT COLECTOMY Right 10/24/2016   Procedure: LAPAROSCOPIC ASSISTED RIGHT COLECTOMY;  Surgeon: Griselda Miner, MD;  Location: [facility];  Service: General;  Laterality: Right;  REPAIR OF PERFORATED ULCER     "bleeding stomach ulcer"   TOTAL THYROIDECTOMY      FAMILY HISTORY: Family History  Problem Relation Age of Onset   Breast cancer Neg Hx     SOCIAL HISTORY: Social History   Socioeconomic History   Marital status: Widowed    Spouse name: Not on file   Number of children: Not on file   Years of education: Not on file   Highest education level: Not on file  Occupational History   Not on file  Tobacco Use   Smoking status: Some Days    Current packs/day: 0.10    Average packs/day: 0.1 packs/day for 58.0 years (5.8 ttl pk-yrs)    Types: Cigarettes   Smokeless tobacco: Never  Vaping Use   Vaping status: Never Used  Substance and Sexual Activity   Alcohol use: No   Drug use: No   Sexual activity: Never  Other Topics Concern   Not on file  Social History Narrative   Not on file   Social Drivers of Health   Financial Resource Strain: Not on file  Food Insecurity: Not on file   Transportation Needs: Not on file  Physical Activity: Not on file  Stress: Not on file  Social Connections: Not on file  Intimate Partner Violence: Not on file      Levert Feinstein, M.D. Ph.D.  [facility] [address], [address] [place], [place] [address] Ph: [phone] Fax: [phone]  CC:  Norm Salt, [place] [address] [place],  [place] [address]  Norm Salt, [place]

## 2023-06-17 NOTE — Progress Notes (Signed)
xeomin 50 units x 1 vial  [id number] [id number] Exp-07/2025 B/B  Bacteriostatic 0.9% Sodium Chloride- 1mL  AOZ:[id number] Expiration: 02/28/2024 NDC: 8469-6295-28 Dx: G51.39 WITNESSED UX:[id number] J RN

## 2023-06-19 ENCOUNTER — Encounter: Payer: Self-pay | Admitting: Neurology

## 2023-06-19 ENCOUNTER — Telehealth: Payer: Self-pay | Admitting: Neurology

## 2023-06-19 NOTE — Telephone Encounter (Signed)
No VM box, sent letter in mail informing pt of appt time change on 09/17/23 due to Dr. Zannie Cove schedule changing

## 2023-06-27 DIAGNOSIS — M4696 Unspecified inflammatory spondylopathy, lumbar region: Secondary | ICD-10-CM | POA: Diagnosis not present

## 2023-06-27 DIAGNOSIS — E559 Vitamin D deficiency, unspecified: Secondary | ICD-10-CM | POA: Diagnosis not present

## 2023-06-27 DIAGNOSIS — E611 Iron deficiency: Secondary | ICD-10-CM | POA: Diagnosis not present

## 2023-07-02 DIAGNOSIS — I10 Essential (primary) hypertension: Secondary | ICD-10-CM | POA: Diagnosis not present

## 2023-07-02 DIAGNOSIS — Z720 Tobacco use: Secondary | ICD-10-CM | POA: Diagnosis not present

## 2023-07-02 DIAGNOSIS — E1165 Type 2 diabetes mellitus with hyperglycemia: Secondary | ICD-10-CM | POA: Diagnosis not present

## 2023-07-02 DIAGNOSIS — E039 Hypothyroidism, unspecified: Secondary | ICD-10-CM | POA: Diagnosis not present

## 2023-07-02 DIAGNOSIS — G5139 Clonic hemifacial spasm, unspecified: Secondary | ICD-10-CM | POA: Diagnosis not present

## 2023-08-25 DIAGNOSIS — E119 Type 2 diabetes mellitus without complications: Secondary | ICD-10-CM | POA: Diagnosis not present

## 2023-09-17 ENCOUNTER — Ambulatory Visit: Payer: 59 | Admitting: Neurology

## 2023-09-17 ENCOUNTER — Encounter: Payer: Self-pay | Admitting: Neurology

## 2023-09-24 DIAGNOSIS — M4696 Unspecified inflammatory spondylopathy, lumbar region: Secondary | ICD-10-CM | POA: Diagnosis not present

## 2023-09-24 DIAGNOSIS — E611 Iron deficiency: Secondary | ICD-10-CM | POA: Diagnosis not present

## 2023-09-24 DIAGNOSIS — E559 Vitamin D deficiency, unspecified: Secondary | ICD-10-CM | POA: Diagnosis not present

## 2023-09-24 DIAGNOSIS — M25562 Pain in left knee: Secondary | ICD-10-CM | POA: Diagnosis not present

## 2023-09-24 DIAGNOSIS — G510 Bell's palsy: Secondary | ICD-10-CM | POA: Diagnosis not present

## 2023-09-25 DIAGNOSIS — M25562 Pain in left knee: Secondary | ICD-10-CM | POA: Diagnosis not present

## 2023-10-08 DIAGNOSIS — E559 Vitamin D deficiency, unspecified: Secondary | ICD-10-CM | POA: Diagnosis not present

## 2023-10-08 DIAGNOSIS — E611 Iron deficiency: Secondary | ICD-10-CM | POA: Diagnosis not present

## 2023-10-08 DIAGNOSIS — M4696 Unspecified inflammatory spondylopathy, lumbar region: Secondary | ICD-10-CM | POA: Diagnosis not present

## 2023-10-08 DIAGNOSIS — G510 Bell's palsy: Secondary | ICD-10-CM | POA: Diagnosis not present

## 2023-10-08 DIAGNOSIS — M25562 Pain in left knee: Secondary | ICD-10-CM | POA: Diagnosis not present

## 2023-10-15 ENCOUNTER — Ambulatory Visit (INDEPENDENT_AMBULATORY_CARE_PROVIDER_SITE_OTHER): Admitting: Neurology

## 2023-10-15 VITALS — BP 154/81

## 2023-10-15 DIAGNOSIS — G5139 Clonic hemifacial spasm, unspecified: Secondary | ICD-10-CM

## 2023-10-15 DIAGNOSIS — Z9889 Other specified postprocedural states: Secondary | ICD-10-CM

## 2023-10-15 DIAGNOSIS — G5133 Clonic hemifacial spasm, bilateral: Secondary | ICD-10-CM

## 2023-10-15 MED ORDER — INCOBOTULINUMTOXINA 50 UNITS IM SOLR
50.0000 [IU] | INTRAMUSCULAR | Status: AC
  Administered 2023-10-15: 50 [IU] via INTRAMUSCULAR

## 2023-10-15 MED ORDER — LAMOTRIGINE 25 MG PO TABS: ORAL_TABLET | ORAL | 11 refills | Status: DC

## 2023-10-15 NOTE — Progress Notes (Signed)
 Chief Complaint  Patient presents with   Injections    Rm14, alone, Pt is well and ready for injection      ASSESSMENT AND PLAN  Heather Wilcox is a 83 y.o. female   Status post left temporal meningioma craniotomy   MRI of the brain with without contrast in February 2023 showed evidence of previous left anterior temporal craniotomy and meningioma resection with expected postoperative change, mild small vessel disease,  Left hemifacial spasm under EMG guidance, Used  50 units of xeomin     2.5 unitsx20-50 units    DIAGNOSTIC DATA (LABS, IMAGING, TESTING) - I reviewed patient records, labs, notes, testing and imaging myself where available. Laboratory evaluations in December 2022: Normal creatinine 0.81, mildly decreased calcium  8.8, CBC hemoglobin of 10.9, RDW of 11.4  MEDICAL HISTORY:  Heather Wilcox is a 83 year old female, seen in request by   her primary care PA Dianah Fort, for evaluation of left hemi-facial spasm, initial evaluation was on May 19, 2021.    I reviewed and summarized the referring note.  Past medical history Hypertension Diabetes History of colon cancer  She has a history of left temporal craniotomy for meningioma in 2006, denies focal deficit, denies seizure  Around 2018 she began to experience intermittent left facial muscle twitching, initially was intermittent, involving left surrounding eye muscle, gradually getting worse, more frequent spreading to her lower face, she denies sensory change, denies visual loss, denies hearing loss  I personally reviewed CT head without contrast in February 2018, no acute intracranial abnormality, mild supratentorium small vessel disease, chronic left encephalomalacia and left temporal lobe,  UPDATE Mar 17 2023: MRI of the brain with without contrast from February 2023 showed postsurgical change of left anterior temporal craniotomy and meningioma resection, stable, mild small vessel disease  She  lost follow-up since last visit, today complains of worsening left hemifacial spasm, present 75% of time, blocking her left vision, difficulty driving, reading, she denies visual loss, no dysarthria, remain active at her church,  UPDATE Jun 17 2023: She did well with previous injection, no significant side effect noted  UPDATE October 15 2023: She has mild left facial asymmetry   PHYSICAL EXAM:      10/15/2023    3:27 PM 10/15/2023    3:23 PM 03/17/2023    9:06 AM  Vitals with BMI  Height   5' 7  Weight   201 lbs 8 oz  BMI   31.55  Systolic 154 130 098  Diastolic 81 63 90  Pulse   84     PHYSICAL EXAMNIATION:  Gen: NAD, conversant, well nourised, well groomed                     Cardiovascular: Regular rate rhythm, no peripheral edema, warm, nontender. Eyes: Conjunctivae clear without exudates or hemorrhage Neck: Supple, no carotid bruits. Pulmonary: Clear to auscultation bilaterally   NEUROLOGICAL EXAM:  MENTAL STATUS: Speech/cognition: Awake, alert, oriented to history taking and casual conversation, CRANIAL NERVES: CN II: Visual fields are full to confrontation. Pupils are round equal and briskly reactive to light. CN III, IV, VI: extraocular movement are normal. No ptosis. CN V: Facial sensation is intact to light touch, bilateral corneal reflex were symmetric CN VII: Frequent almost constant left orbicularis oculi, and left lower face muscle twitching, CN VIII: Hearing is normal to causal conversation. CN IX, X: Phonation is normal. CN XI: Head turning and shoulder shrug are intact  MOTOR: There is  no pronator drift of out-stretched arms. Muscle bulk and tone are normal. Muscle strength is normal.  REFLEXES: Reflexes are 2+ and symmetric at the biceps, triceps, knees, and ankles. Plantar responses are flexor.  SENSORY: Intact to light touch, pinprick and vibratory sensation are intact in fingers and toes.  COORDINATION: There is no trunk or limb dysmetria  noted.  GAIT/STANCE: Need push-up to get up from seated position, cautious, steady Romberg is absent.  REVIEW OF SYSTEMS:  Full 14 system review of systems performed and notable only for as above All other review of systems were negative.   ALLERGIES: Allergies  Allergen Reactions   Meloxicam Other (See Comments)    Pt states it caused intestinal bleeding   Penicillin G Other (See Comments)    Other reaction(s): Unknown    HOME MEDICATIONS: Current Outpatient Medications  Medication Sig Dispense Refill   acetaminophen  (TYLENOL ) 325 MG tablet Take 2 tablets (650 mg total) by mouth every 6 (six) hours as needed for mild pain (or Fever >/= 101).     amLODipine  (NORVASC ) 10 MG tablet Take 1 tablet (10 mg total) by mouth daily. 90 tablet 1   cholecalciferol (VITAMIN D) 1000 units tablet Take 1,000 Units by mouth daily.     cloNIDine  (CATAPRES ) 0.1 MG tablet Take 1 tablet (0.1 mg total) by mouth 2 (two) times daily. 60 tablet 11   folic acid  (FOLVITE ) 400 MCG tablet Take 400 mcg by mouth daily.     glipiZIDE (GLUCOTROL) 5 MG tablet Take 1 tablet by mouth 2 (two) times daily.     incobotulinumtoxinA  (XEOMIN ) 50 units SOLR injection Inject 50 Units into the muscle every 3 (three) months. 1 each 3   lamoTRIgine  (LAMICTAL ) 25 MG tablet One tab twice a day x one week 2 tabs twice a day 120 tablet 11   levothyroxine  (SYNTHROID , LEVOTHROID) 75 MCG tablet Take 75 mcg by mouth daily before breakfast.     lisinopril -hydrochlorothiazide  (ZESTORETIC ) 20-12.5 MG tablet Take 1 tablet by mouth daily. 90 tablet 1   meloxicam (MOBIC) 15 MG tablet Take 15 mg by mouth daily.     traMADol (ULTRAM) 50 MG tablet Take 50 mg by mouth 2 (two) times daily as needed.     Current Facility-Administered Medications  Medication Dose Route Frequency Provider Last Rate Last Admin   incobotulinumtoxinA  (XEOMIN ) 50 units injection 50 Units  50 Units Intramuscular Q90 days Phebe Brasil, MD       incobotulinumtoxinA   (XEOMIN ) 50 units injection 50 Units  50 Units Intramuscular Q90 days Phebe Brasil, MD   50 Units at 06/17/23 1008   incobotulinumtoxinA  (XEOMIN ) 50 units injection 50 Units  50 Units Intramuscular Q90 days Phebe Brasil, MD        PAST MEDICAL HISTORY: Past Medical History:  Diagnosis Date   Arthritis    left knee (10/24/2016)   Colon cancer (HCC)    ADENOMA OF COLON AT HEPATIC FLEXURE/notes 10/24/2016   History of stomach ulcers    Hypertension    Hypothyroidism     PAST SURGICAL HISTORY: Past Surgical History:  Procedure Laterality Date   COLON SURGERY     COLONOSCOPY     CRANIOTOMY FOR TUMOR Left 04/2004    temporal craniotomy for meningioma with microdissection/notes 09/11/2010   INCISION AND DRAINAGE PERIRECTAL ABSCESS N/A 06/13/2016   Procedure: IRRIGATION AND DEBRIDEMENT PERIRECTAL ABSCESS;  Surgeon: Lillette Reid III, MD;  Location: [facility];  Service: General;  Laterality: N/A;   LAPAROSCOPIC PARTIAL RIGHT COLECTOMY Right  10/24/2016   LAPAROSCOPIC RIGHT COLECTOMY Right 10/24/2016   Procedure: LAPAROSCOPIC ASSISTED RIGHT COLECTOMY;  Surgeon: Caralyn Chandler, MD;  Location: [facility];  Service: General;  Laterality: Right;   REPAIR OF PERFORATED ULCER     bleeding stomach ulcer   TOTAL THYROIDECTOMY      FAMILY HISTORY: Family History  Problem Relation Age of Onset   Breast cancer Neg Hx     SOCIAL HISTORY: Social History   Socioeconomic History   Marital status: Widowed    Spouse name: Not on file   Number of children: Not on file   Years of education: Not on file   Highest education level: Not on file  Occupational History   Not on file  Tobacco Use   Smoking status: Some Days    Current packs/day: 0.10    Average packs/day: 0.1 packs/day for 58.0 years (5.8 ttl pk-yrs)    Types: Cigarettes   Smokeless tobacco: Never  Vaping Use   Vaping status: Never Used  Substance and Sexual Activity   Alcohol use: No   Drug use: No   Sexual activity: Never  Other Topics  Concern   Not on file  Social History Narrative   Not on file   Social Drivers of Health   Financial Resource Strain: Not on file  Food Insecurity: Not on file  Transportation Needs: Not on file  Physical Activity: Not on file  Stress: Not on file  Social Connections: Not on file  Intimate Partner Violence: Not on file      Phebe Brasil, M.D. Ph.D.  [facility] [address], [address] [place], [place] [address] Ph: [phone] Fax: [phone]  CC:  Dianah Fort, [place] [address] [place],  [place] [address]  Dianah Fort, [place]

## 2023-10-15 NOTE — Progress Notes (Signed)
 xeomin  50units x 1 vial  [id number] Exp-2027/07 B/B  Bacteriostatic 0.9% Sodium Chloride - 1mL  FAO:[id number] Expiration: 6/60/26 NDC: [id number] Dx: G51.39  WITNESSED BY:A Rochelle Chu RN

## 2023-10-16 ENCOUNTER — Ambulatory Visit: Payer: Self-pay | Admitting: Neurology

## 2023-10-16 LAB — CBC WITH DIFFERENTIAL/PLATELET
Basophils Absolute: 0 10*3/uL (ref 0.0–0.2)
Basos: 0 %
EOS (ABSOLUTE): 0.1 10*3/uL (ref 0.0–0.4)
Eos: 1 %
Hematocrit: 40.3 % (ref 34.0–46.6)
Hemoglobin: 13.1 g/dL (ref 11.1–15.9)
Immature Grans (Abs): 0 10*3/uL (ref 0.0–0.1)
Immature Granulocytes: 0 %
Lymphocytes Absolute: 1.8 10*3/uL (ref 0.7–3.1)
Lymphs: 39 %
MCH: 29.6 pg (ref 26.6–33.0)
MCHC: 32.5 g/dL (ref 31.5–35.7)
MCV: 91 fL (ref 79–97)
Monocytes Absolute: 0.4 10*3/uL (ref 0.1–0.9)
Monocytes: 8 %
Neutrophils Absolute: 2.4 10*3/uL (ref 1.4–7.0)
Neutrophils: 52 %
Platelets: 199 10*3/uL (ref 150–450)
RBC: 4.43 x10E6/uL (ref 3.77–5.28)
RDW: 12.5 % (ref 11.7–15.4)
WBC: 4.7 10*3/uL (ref 3.4–10.8)

## 2023-10-16 LAB — COMPREHENSIVE METABOLIC PANEL WITH GFR
ALT: 16 IU/L (ref 0–32)
AST: 18 IU/L (ref 0–40)
Albumin: 3.8 g/dL (ref 3.7–4.7)
Alkaline Phosphatase: 109 IU/L (ref 44–121)
BUN/Creatinine Ratio: 17 (ref 12–28)
BUN: 16 mg/dL (ref 8–27)
Bilirubin Total: 0.3 mg/dL (ref 0.0–1.2)
CO2: 23 mmol/L (ref 20–29)
Calcium: 9.4 mg/dL (ref 8.7–10.3)
Chloride: 102 mmol/L (ref 96–106)
Creatinine, Ser: 0.92 mg/dL (ref 0.57–1.00)
Globulin, Total: 3.7 g/dL (ref 1.5–4.5)
Glucose: 138 mg/dL — ABNORMAL HIGH (ref 70–99)
Potassium: 3.9 mmol/L (ref 3.5–5.2)
Sodium: 140 mmol/L (ref 134–144)
Total Protein: 7.5 g/dL (ref 6.0–8.5)
eGFR: 63 mL/min/{1.73_m2} (ref >59)

## 2023-10-20 ENCOUNTER — Telehealth: Payer: Self-pay | Admitting: Neurology

## 2023-10-20 MED ORDER — LAMOTRIGINE 25 MG PO TABS: ORAL_TABLET | ORAL | 11 refills | Status: AC

## 2023-10-20 NOTE — Telephone Encounter (Signed)
 FAXED RX TO  [facility] [address] [place], [place] - [address] Phone: [phone]  Fax: [phone]

## 2023-10-20 NOTE — Telephone Encounter (Signed)
 Pt stated that left eye is swollen and still jumpy and doesn't see any relief from treatment and would like some help was told during injection that a rx would be sent to pharmacy but nothing has been sent

## 2023-10-20 NOTE — Telephone Encounter (Signed)
 Oneita Hoist, [facility]    10/20/23  9:02 AM Note Pt stated that left eye is swollen and still jumpy and doesn't see any relief from treatment and would like some help was told during injection that a rx would be sent to pharmacy but nothing has been sent        I called, failed to reach patient, please try again, she had injection on June 18th, may give it longer time 1-2 weeks for the injection to take effect,   I did Rx low-dose of lamotrigine  to her pharmacy, please fax.

## 2023-10-20 NOTE — Telephone Encounter (Signed)
 Called and spoke to pt and stated that the rx was sent and to let us  know should she have further questions. Pt voiced gratitude and understanding and stated that she would give medication a try and call us  back

## 2023-11-17 DIAGNOSIS — H18413 Arcus senilis, bilateral: Secondary | ICD-10-CM | POA: Diagnosis not present

## 2023-11-17 DIAGNOSIS — Z79899 Other long term (current) drug therapy: Secondary | ICD-10-CM | POA: Diagnosis not present

## 2023-11-17 DIAGNOSIS — I10 Essential (primary) hypertension: Secondary | ICD-10-CM | POA: Diagnosis not present

## 2023-11-17 DIAGNOSIS — R011 Cardiac murmur, unspecified: Secondary | ICD-10-CM | POA: Diagnosis not present

## 2023-11-17 DIAGNOSIS — Z136 Encounter for screening for cardiovascular disorders: Secondary | ICD-10-CM | POA: Diagnosis not present

## 2023-11-17 DIAGNOSIS — H02402 Unspecified ptosis of left eyelid: Secondary | ICD-10-CM | POA: Diagnosis not present

## 2023-11-17 DIAGNOSIS — R7989 Other specified abnormal findings of blood chemistry: Secondary | ICD-10-CM | POA: Diagnosis not present

## 2023-11-17 DIAGNOSIS — E119 Type 2 diabetes mellitus without complications: Secondary | ICD-10-CM | POA: Diagnosis not present

## 2023-11-17 DIAGNOSIS — Z0000 Encounter for general adult medical examination without abnormal findings: Secondary | ICD-10-CM | POA: Diagnosis not present

## 2023-11-17 DIAGNOSIS — Z86011 Personal history of benign neoplasm of the brain: Secondary | ICD-10-CM | POA: Diagnosis not present

## 2023-11-27 ENCOUNTER — Other Ambulatory Visit (HOSPITAL_BASED_OUTPATIENT_CLINIC_OR_DEPARTMENT_OTHER): Payer: Self-pay | Admitting: Nurse Practitioner

## 2023-11-27 DIAGNOSIS — I10 Essential (primary) hypertension: Secondary | ICD-10-CM | POA: Diagnosis not present

## 2023-11-27 DIAGNOSIS — R7989 Other specified abnormal findings of blood chemistry: Secondary | ICD-10-CM | POA: Diagnosis not present

## 2023-11-27 DIAGNOSIS — D492 Neoplasm of unspecified behavior of bone, soft tissue, and skin: Secondary | ICD-10-CM | POA: Diagnosis not present

## 2023-11-27 DIAGNOSIS — M150 Primary generalized (osteo)arthritis: Secondary | ICD-10-CM | POA: Diagnosis not present

## 2023-11-27 DIAGNOSIS — R011 Cardiac murmur, unspecified: Secondary | ICD-10-CM | POA: Diagnosis not present

## 2023-11-27 DIAGNOSIS — H18413 Arcus senilis, bilateral: Secondary | ICD-10-CM | POA: Diagnosis not present

## 2023-11-27 DIAGNOSIS — H02402 Unspecified ptosis of left eyelid: Secondary | ICD-10-CM | POA: Diagnosis not present

## 2023-11-27 DIAGNOSIS — G3184 Mild cognitive impairment, so stated: Secondary | ICD-10-CM | POA: Diagnosis not present

## 2023-11-27 DIAGNOSIS — E039 Hypothyroidism, unspecified: Secondary | ICD-10-CM | POA: Diagnosis not present

## 2023-11-27 DIAGNOSIS — E2839 Other primary ovarian failure: Secondary | ICD-10-CM

## 2023-11-27 DIAGNOSIS — Z0001 Encounter for general adult medical examination with abnormal findings: Secondary | ICD-10-CM | POA: Diagnosis not present

## 2023-11-27 DIAGNOSIS — E119 Type 2 diabetes mellitus without complications: Secondary | ICD-10-CM | POA: Diagnosis not present

## 2023-12-24 ENCOUNTER — Ambulatory Visit: Admitting: Neurology

## 2023-12-25 ENCOUNTER — Telehealth: Payer: Self-pay | Admitting: Neurology

## 2023-12-25 MED ORDER — XEOMIN 50 UNITS IM SOLR: 50.0000 [IU] | INTRAMUSCULAR | 3 refills | Status: AC

## 2023-12-25 NOTE — Telephone Encounter (Signed)
 Refilled

## 2023-12-25 NOTE — Telephone Encounter (Signed)
 Submitted auth request to change pt to SP, status is pending. Key: [id number]

## 2023-12-25 NOTE — Addendum Note (Signed)
 Addended by: ONEITA NEVELYN BRAVO on: 12/25/2023 01:46 PM   Modules accepted: Orders

## 2023-12-25 NOTE — Telephone Encounter (Signed)
 Auth was approved, please send rx to [facility].  Auth#: [id number] (12/25/23-03/26/24)

## 2024-01-19 NOTE — Telephone Encounter (Signed)
 She was scheduled for 9/24-I called [facility] to set up delivery and they said need her consent to ship but haven't been able to reach her. I called the patient and let her know and she said that she has had multiple deaths in her family recently and wanted to push out her appointment. She said she is doing fine is ok with waiting. I scheduled her for 11/24 and let her know Dr. Onita may possibly be off that week and she was ok with that and I told her we will call to reschedule as soon as we know. She said she will call [facility] back later before this appointment.

## 2024-01-21 ENCOUNTER — Ambulatory Visit: Admitting: Neurology

## 2024-03-01 ENCOUNTER — Other Ambulatory Visit: Payer: Self-pay

## 2024-03-01 NOTE — Patient Outreach (Signed)
 Aging Gracefully Program  03/01/2024  Heather Wilcox [birth year] [id number]   [facility] Evaluation Interviewer made contact with patient. Aging Gracefully survey completed.   Interviewer will send referral to RN and OT for follow up.   Shereen [facility]  Population Health Care Management Assistant  Direct Dial: [phone]  Fax: [phone] Website: [email or web address]

## 2024-03-11 ENCOUNTER — Other Ambulatory Visit: Payer: Self-pay | Admitting: Nurse Practitioner

## 2024-03-11 DIAGNOSIS — Z1231 Encounter for screening mammogram for malignant neoplasm of breast: Secondary | ICD-10-CM

## 2024-03-17 ENCOUNTER — Other Ambulatory Visit: Payer: Self-pay | Admitting: Specialist

## 2024-03-21 NOTE — Patient Outreach (Signed)
 Aging Gracefully Program  OT Initial Visit  03/17/2024  Heather Wilcox Dec 27, 1940 [id number]  Visit:  1- Initial Visit  Start Time:  1100 End Time:  1145 Total Minutes:  45  CCAP: Typical Daily Routine: Typical Daily Routine:: involved with church. she and her sister share the cooking, cleaning responsibilities.  she still drives.  she attends 2 bible studies and also an older adult program at the [employer]. her sister and brother in law live in the home, as well.  PMH:  HTN, DM, B knee arthritis What Types Of Care Problems Are You Having Throughout The Day?: difficult getting in and out of the bed, difficulty getting in and out of shower What Kind Of Help Do You Receive?: n/a Do You Think You Need Other Types Of Help?: no What Do You Think Would Make Everyday Life Easier For You?: easier to navigate bathroom and make it easier to get out of bed What Is A Good Day Like?: less pain and stiffness in back and legs What Is A Bad Day Like?: more pain and stiffness and less mobility Do You Have Time For Yourself?: yes Patient Reported Equipment: Patient Reported Equipment Currently Used:  (elevated commode seat) Functional Mobility-Walking Indoors/Getting Around the House: Walking Indoors/Getting Around [employer]: A Little Difficulty Do You:: No Device/No Assistance Importance Of Learning New Strategies:: Not At All Functional Mobility-Walk A Block: Walk A Block: A Little Difficulty Do You:: No Device/No Assistance Importance Of Learning New Strategies:: Not At All Functional Mobility-Maintain Balance While Showering: Maintaining Balance While Showering: Moderate Difficulty Do You:: Use A Device Importance Of Learning New Strategies:: Very Much Intervention: Yes Functional Mobility-Stooping, Crouching, Kneeling To Retreive Item: Stooping, Crouching, or Kneeling To Retrieve Item: Unable To Do Do You:: No Device/No Assistance Importance Of Learning New Strategies:: Very  Much Intervention: Yes Functional Mobility-Bending From Standing Position To Pick Up Clothing Off The Floor: Bending Over From Standing Position To Pick Up Clothing Off The Floor: Unable To Do Do You:: No Device/No Assistance Importance Of Learning New Strategies:: Very Much Intervention: Yes Functional Mobility-Reaching For Items Above Shoulder Level: Reaching For Items Above Shoulder Level: No Difficulty Functional Mobility-Climb 1 Flight Of Stairs: Climb 1 Flight Of Stairs: No Difficulty Functional Mobility-Move In And Out Of Chair: Move In and Out Of A Chair: Moderate Difficulty Do You:: No Device/No Assistance Importance Of Learning New Strategies:: Very Much Intervention: Yes Functional Mobility-Move In And Out Of Bed: Move In and Out Of Bed: A Little Difficulty Do You:: No Device/No Assistance Importance Of Learning New Strategies:: Very Much Intervention: Yes Functional Mobility-Move In And Out Of Bath/Shower: Move In And Out Of A Bath/Shower: Moderate Difficulty Do You:: Use A Device Importance Of Learning New Strategies:: Very Much Intervention: Yes Functional Mobility-Get On And Off Toilet: Getting Up From The Floor: Unable To Do Do You:: No Device/No Assistance Importance Of Learning New Strategies:: Very Much Functional Mobility-Into And Out Of Car, Not Including Driving: Into  And Out Of Car, Not Including Driving: A Lot Of Difficulty Do You:: No Device/No Assistance Importance Of Learning New Strategies:: Very Much Intervention: Yes Functional Mobility-Other Mobility Difficulty:      Activities of Daily Living-Bathing/Showering: ADL-Bathing/Showering: No Difficulty Activities of Daily Living-Personal Hygiene and Grooming: Personal Hygiene and Grooming: No Difficulty Activities of Daily Living-Toilet Hygiene: Toilet Hygiene: No Difficulty Activities of Daily Living-Put On And Take Off Undergarments (Incl. Fasteners): Put On And Take Off Undergarments  (Incl. Fasteners): No Difficulty Activities of Daily Living-Put  On And Take Off Shirt/Dress/Coat (Incl. Fasteners): Put On And Take Off Shirt/Dress/Coat (Incl. Fasteners): No Difficulty Activities of Daily Living-Put On And Take Off Socks And Shoes: Put On And Take Off Socks And  Shoes: A Lot of Difficulty Do You:: No Device/No Assistance Importance Of Learning New Strategies: Very Much Intervention: Yes Activities of Daily Living-Feed Self: Feed Self: No Difficulty Activities of Daily Living-Rest And Sleep: Rest and Sleep: A Little Difficulty Do You:: No Device/No Assistance Importance Of Learning New Strategies: Not At All Activities of Daily Living-Sexual Activity: Sexual  Activity: N/A Activities of Daily Living-Other Activity Identified:    Instrumental Activities of Daily Living-Light Homemaking (Laundry, Straightening Up, Vacuuming):  Do Light Homemaking (Laundry, Straightening Up, Vacuuming): No Difficulty Instrumental Activities of Daily Living-Making A Bed: Making a Bed: No Difficulty Instrumental Activities of Daily Living-Washing Dishes By Hand While Standing At The Sink: Washing Dishes By Hand While Standing At The Sink: No Difficulty Instrumental Activities of Daily Living-Grocery Shopping: Do Grocery Shopping: No Difficulty Instrumental Activities of Daily Living-Use Telephone: Use Telephone: No Difficulty Instrumental Activities of Daily Living-Financial Management: Financial Management: No Difficulty Instrumental Activities of Daily Living-Medications: Take Medications: No Difficulty Instrumental Activities of Daily Living-Health Management And Maintenance: Health Management & Maintenance: No Difficulty Instrumental Activities of Daily Living-Meal Preparation and Clean-Up: Meal Preparation and Clean-Up: No Difficulty Instrumental Activities of Daily Living-Provide Care For Others/Pets: Care For Others/Pets: No Difficulty Instrumental Activities of Daily  Living-Take Part In Organized Social Activities: Take Part In Organized Social Activities: No Difficulty Instrumental Activities of Daily Living-Leisure Participation: Leisure Participation: No Difficulty Instrumental Activities of Daily Living-Employment/Volunteer Activities: Employment/Volunteer Activities: N/A Instrumental Activities of Daily Living-Other Identifies:    Readiness To Change Score:  Readiness to Change Score: 9  Home Environment Assessment: Outside Home Entry:: back entry door is off of the hinges.  steps are in poor condition and no handrail, ramp at front entrance Dining Room:: n/a Living Room:: wfl Kitchen:: previous leak in ceiling, cabinetry with sink is not attached to the wall, flooring is uneven and a trip hazard Stairs:: n/a Bathroom:: would benefit from a walk in shower and an ADA commode Master Bedroom:: ceiling has leaked in the past, poor insulation in the room.  leak in bathroom has leaked into bedroom and ruined [employer]:: n/a go to the [employer]:: n/a Hallways:: flooring is uneven Sports Administrator:: n/a Scientist, Forensic:: n/a Mailbox:: n/a Other Home Environment Concerns:: reports leaking and mold under the house  Durable Medical Equipment:    Patient Education: Education Provided: Yes Education Details: educated patient on use of check for Teacher, Adult Education) Educated: Patient Comprehension: Verbalized Understanding  Goals:  Goals Addressed             This Visit's Progress    Patient Stated       Improve safety entering and exiting her home.     Patient Stated       Incrase safety and mobility with bathroom transfers.      Patient Stated       Increase safety and independence with lower extremity dressing, reaching, and bedmobility.     Patient Stated       Patient will increase safety and independence with car and bed transfers.         Post Clinical Reasoning: Clinician View Of Client  Situation:: Patient is motivated to age in place.  she has a good support system in her home and on her street.  she would  like to do whatever possible to remain in her home for the next several years. Client View Of His/Her Situation:: doing as well as she can and motivated to age in place. Next Visit Plan:: problem solve goal 1:  improved independence with LE Dressing.  Review how to safely get up from a fall.  Heather [name] Elbe, MHA, OT/L [phone]

## 2024-03-22 ENCOUNTER — Encounter: Payer: Self-pay | Admitting: Neurology

## 2024-03-22 ENCOUNTER — Ambulatory Visit: Admitting: Neurology

## 2024-03-22 ENCOUNTER — Other Ambulatory Visit: Payer: Self-pay | Admitting: *Deleted

## 2024-03-22 NOTE — Patient Outreach (Signed)
 Aging Gracefully Program  03/22/2024  Heather Wilcox 09/11/1940 [id number]   Returned telephone call received from Shanda Beckmann (sister). Shanda states she can schedule for [name] and [name]' Aging Gracefully home visits. They live in the same household. Therefore this will be a joint visit. The earliest appointment agreed upon is set for December 23rd at 1 pm. Clinical Research Associate provided contact information to [place].    Pablo Hurst, MSN, RN, BSN [place]  [facility], Healthy Communities RN Case Manager for Aging Gracefully Direct Dial: [phone]

## 2024-03-29 ENCOUNTER — Encounter: Payer: Self-pay | Admitting: Neurology

## 2024-03-29 ENCOUNTER — Ambulatory Visit: Admitting: Neurology

## 2024-03-29 VITALS — BP 151/91

## 2024-03-29 DIAGNOSIS — G5133 Clonic hemifacial spasm, bilateral: Secondary | ICD-10-CM | POA: Diagnosis not present

## 2024-03-29 DIAGNOSIS — G5139 Clonic hemifacial spasm, unspecified: Secondary | ICD-10-CM

## 2024-03-29 MED ORDER — INCOBOTULINUMTOXINA 50 UNITS IM SOLR: 50.0000 [IU] | INTRAMUSCULAR | Status: AC

## 2024-03-29 MED ORDER — INCOBOTULINUMTOXINA 50 UNITS IM SOLR: 50.0000 [IU] | INTRAMUSCULAR | Status: DC

## 2024-03-29 NOTE — Progress Notes (Unsigned)
 Chief Complaint  Patient presents with   RM14/XEOMIN     Pt is here Alone.       ASSESSMENT AND PLAN  Heather Wilcox is a 83 y.o. female   Status post left temporal meningioma craniotomy   MRI of the brain with without contrast in February 2023 showed evidence of previous left anterior temporal craniotomy and meningioma resection with expected postoperative change, mild small vessel disease,  Left hemifacial spasm under EMG guidance, Used  50 units of xeomin     2.5 unitsx20-50 units    DIAGNOSTIC DATA (LABS, IMAGING, TESTING) - I reviewed patient records, labs, notes, testing and imaging myself where available. Laboratory evaluations in December 2022: Normal creatinine 0.81, mildly decreased calcium  8.8, CBC hemoglobin of 10.9, RDW of 11.4  MEDICAL HISTORY:  Heather Wilcox is a 83 year old female, seen in request by   her primary care PA Rosalea Rosina SAILOR, for evaluation of left hemi-facial spasm, initial evaluation was on May 19, 2021.    I reviewed and summarized the referring note.  Past medical history Hypertension Diabetes History of colon cancer  She has a history of left temporal craniotomy for meningioma in 2006, denies focal deficit, denies seizure  Around 2018 she began to experience intermittent left facial muscle twitching, initially was intermittent, involving left surrounding eye muscle, gradually getting worse, more frequent spreading to her lower face, she denies sensory change, denies visual loss, denies hearing loss  I personally reviewed CT head without contrast in February 2018, no acute intracranial abnormality, mild supratentorium small vessel disease, chronic left encephalomalacia and left temporal lobe,  UPDATE Mar 17 2023: MRI of the brain with without contrast from February 2023 showed postsurgical change of left anterior temporal craniotomy and meningioma resection, stable, mild small vessel disease  She lost follow-up since last  visit, today complains of worsening left hemifacial spasm, present 75% of time, blocking her left vision, difficulty driving, reading, she denies visual loss, no dysarthria, remain active at her church,  UPDATE Jun 17 2023: She did well with previous injection, no significant side effect noted  UPDATE October 15 2023: She has mild left facial asymmetry  UPDATE Mar 29 2024: Previous injection was helpful.  PHYSICAL EXAM:      03/29/2024    9:32 AM 10/15/2023    3:27 PM 10/15/2023    3:23 PM  Vitals with BMI  Systolic 151 154 869  Diastolic 91 81 63     PHYSICAL EXAMNIATION:  Gen: NAD, conversant, well nourised, well groomed                     Cardiovascular: Regular rate rhythm, no peripheral edema, warm, nontender. Eyes: Conjunctivae clear without exudates or hemorrhage Neck: Supple, no carotid bruits. Pulmonary: Clear to auscultation bilaterally   NEUROLOGICAL EXAM:  MENTAL STATUS: Speech/cognition: Awake, alert, oriented to history taking and casual conversation, CRANIAL NERVES: CN II: Visual fields are full to confrontation. Pupils are round equal and briskly reactive to light. CN III, IV, VI: extraocular movement are normal. No ptosis. CN V: Facial sensation is intact to light touch, bilateral corneal reflex were symmetric CN VII: Frequent almost constant left orbicularis oculi, and left lower face muscle twitching, CN VIII: Hearing is normal to causal conversation. CN IX, X: Phonation is normal. CN XI: Head turning and shoulder shrug are intact  MOTOR: There is no pronator drift of out-stretched arms. Muscle bulk and tone are normal. Muscle strength is normal.  REFLEXES: Reflexes are  2+ and symmetric at the biceps, triceps, knees, and ankles. Plantar responses are flexor.  SENSORY: Intact to light touch, pinprick and vibratory sensation are intact in fingers and toes.  COORDINATION: There is no trunk or limb dysmetria noted.  GAIT/STANCE: Need push-up to get up  from seated position, cautious, steady Romberg is absent.  REVIEW OF SYSTEMS:  Full 14 system review of systems performed and notable only for as above All other review of systems were negative.   ALLERGIES: Allergies  Allergen Reactions   Meloxicam Other (See Comments)    Pt states it caused intestinal bleeding   Penicillin G Other (See Comments)    Other reaction(s): Unknown    HOME MEDICATIONS: Current Outpatient Medications  Medication Sig Dispense Refill   acetaminophen  (TYLENOL ) 325 MG tablet Take 2 tablets (650 mg total) by mouth every 6 (six) hours as needed for mild pain (or Fever >/= 101).     amLODipine  (NORVASC ) 10 MG tablet Take 1 tablet (10 mg total) by mouth daily. 90 tablet 1   cloNIDine  (CATAPRES ) 0.1 MG tablet Take 1 tablet (0.1 mg total) by mouth 2 (two) times daily. 60 tablet 11   folic acid  (FOLVITE ) 400 MCG tablet Take 400 mcg by mouth daily.     glipiZIDE (GLUCOTROL) 5 MG tablet Take 1 tablet by mouth 2 (two) times daily.     incobotulinumtoxinA  (XEOMIN ) 50 units SOLR injection Inject 50 Units into the muscle every 3 (three) months. 1 each 3   levothyroxine  (SYNTHROID , LEVOTHROID) 75 MCG tablet Take 75 mcg by mouth daily before breakfast.     lisinopril -hydrochlorothiazide  (ZESTORETIC ) 20-12.5 MG tablet Take 1 tablet by mouth daily. 90 tablet 1   meloxicam (MOBIC) 15 MG tablet Take 15 mg by mouth daily.     traMADol (ULTRAM) 50 MG tablet Take 50 mg by mouth 2 (two) times daily as needed.     cholecalciferol (VITAMIN D) 1000 units tablet Take 1,000 Units by mouth daily. (Patient not taking: Reported on 03/29/2024)     lamoTRIgine  (LAMICTAL ) 25 MG tablet One tab twice a day x one week  2 tabs twice a day (Patient not taking: Reported on 03/29/2024) 120 tablet 11   Current Facility-Administered Medications  Medication Dose Route Frequency Provider Last Rate Last Admin   incobotulinumtoxinA  (XEOMIN ) 50 units injection 50 Units  50 Units Intramuscular Q90 days Onita Duos, MD       incobotulinumtoxinA  (XEOMIN ) 50 units injection 50 Units  50 Units Intramuscular Q90 days Onita Duos, MD   50 Units at 06/17/23 1008   incobotulinumtoxinA  (XEOMIN ) 50 units injection 50 Units  50 Units Intramuscular Q90 days Onita Duos, MD   50 Units at 10/15/23 1752   incobotulinumtoxinA  (XEOMIN ) 50 units injection 50 Units  50 Units Intramuscular Q90 days Onita Duos, MD        PAST MEDICAL HISTORY: Past Medical History:  Diagnosis Date   Arthritis    left knee (10/24/2016)   Colon cancer (HCC)    ADENOMA OF COLON AT HEPATIC FLEXURE/notes 10/24/2016   History of stomach ulcers    Hypertension    Hypothyroidism     PAST SURGICAL HISTORY: Past Surgical History:  Procedure Laterality Date   COLON SURGERY     COLONOSCOPY     CRANIOTOMY FOR TUMOR Left 04/2004    temporal craniotomy for meningioma with microdissection/notes 09/11/2010   INCISION AND DRAINAGE PERIRECTAL ABSCESS N/A 06/13/2016   Procedure: IRRIGATION AND DEBRIDEMENT PERIRECTAL ABSCESS;  Surgeon: Deward Null III,  MD;  Location: [facility];  Service: General;  Laterality: N/A;   LAPAROSCOPIC PARTIAL RIGHT COLECTOMY Right 10/24/2016   LAPAROSCOPIC RIGHT COLECTOMY Right 10/24/2016   Procedure: LAPAROSCOPIC ASSISTED RIGHT COLECTOMY;  Surgeon: Curvin Deward MOULD, MD;  Location: [facility] OR;  Service: General;  Laterality: Right;   REPAIR OF PERFORATED ULCER     bleeding stomach ulcer   TOTAL THYROIDECTOMY      FAMILY HISTORY: Family History  Problem Relation Age of Onset   Breast cancer Neg Hx     SOCIAL HISTORY: Social History   Socioeconomic History   Marital status: Widowed    Spouse name: Not on file   Number of children: Not on file   Years of education: Not on file   Highest education level: Not on file  Occupational History   Not on file  Tobacco Use   Smoking status: Some Days    Current packs/day: 0.10    Average packs/day: 0.1 packs/day for 58.0 years (5.8 ttl pk-yrs)    Types: Cigarettes    Smokeless tobacco: Never  Vaping Use   Vaping status: Never Used  Substance and Sexual Activity   Alcohol use: No   Drug use: No   Sexual activity: Never  Other Topics Concern   Not on file  Social History Narrative   Not on file   Social Drivers of Health   Financial Resource Strain: Not on file  Food Insecurity: Not on file  Transportation Needs: Not on file  Physical Activity: Not on file  Stress: Not on file  Social Connections: Not on file  Intimate Partner Violence: Not on file      Modena Callander, M.D. Ph.D.  [facility] [address] [place], [place] [address] Ph: [phone] Fax: [phone]  CC:  Rosalea Rosina SAILOR, [place] [address] [place],  [place] [address]  Rosalea Rosina SAILOR, [place]

## 2024-03-29 NOTE — Progress Notes (Unsigned)
 xeomin  50units x 1 vial  [id number] Exp-2027/10 s/p Bacteriostatic 0.9% Sodium Chloride - 2mL  Onu:[id number] Expiration: 02/26/25 NDC: [id number] Dx: G51.39  WITNESSED AB:[id number] cma

## 2024-04-16 ENCOUNTER — Other Ambulatory Visit: Admitting: Specialist

## 2024-04-19 ENCOUNTER — Ambulatory Visit

## 2024-04-19 DIAGNOSIS — Z1231 Encounter for screening mammogram for malignant neoplasm of breast: Secondary | ICD-10-CM

## 2024-04-19 NOTE — Patient Outreach (Signed)
 Aging Gracefully Program  OT Follow-Up Visit  04/16/2024  Heather Wilcox [birth year] [id number]  Visit:  2- Second Visit  Start Time:  1330 End Time:  1400 Total Minutes:  30   Durable Medical Equipment: Adaptive Equipment: Sock Aid Adaptive Equipment Distribution Date: 04/16/24  Patient Education: Education Provided: Yes Education Details: educated tips for getting up safely from a fall Person(s) Educated: Patient, Other (comment) (sister) Comprehension: Verbalized Understanding, Returned Demonstration  Goals:   Goals Addressed             This Visit's Progress    Patient Stated       Increase safety and independence with lower extremity dressing, reaching, and bed mobility.  Name _________________________________       Date__________________  OT ACTION PLAN: Dressing  Target Problem Area:  Decreased safety with LE dressing   Why Problem May Occur:     Pain in knees and back due to arthritis  Decreased balance  Hard to reach feet        Target Goal(s):  Improved safety with LE dressing.    STRATEGIES   Saving Your Energy DO:  X Sit down to do tasks- a lightweight chair can be kept in the bathroom  X Use appropriate adaptive equipment (circle appropriate equipment): long-handled shoe horn, sock aide, dressing stick, reacher   X Keep all items you'll need within easy reach  (Other):   (Other):   (Other):     Modifying your home environment and making it safe    DO:                 Simplifying the way you set up tasks or daily routines DO:  Wear sturdy shoes that grip the floor   Gather everything you will need before beginning  Wear clothing that is easily manipulated (elastic waistband, etc.)  (Other)   (Other):   (Other):     PRACTICE  Based on what we have talked about, you are willing to try:  ___use sock aide and reacher for LE  dressing.________________________  ________________________________________________________________   If an idea does not work the first time, try it again (and again).  We may make some changes over the next few sessions, based on how they work.     ____[name] MILLIN, OT/L________________     _12/19/25__________ Occupational Therapist           Date          Post Clinical Reasoning: Client Action (Goal) One Interventions: Improve safety and independence with LE Dressing - discussed strategies to improve LE dressing and issued a sock aide for use. Did Client Try?: Yes Targeted Problem Area Status: A Little Better Clinician View Of Client Situation:: Patient is doing well - she is fairly independent with LE dressing and realizes that there are techniques that can be used to make activity easier.  Patient has a lifeline alert necklace.  She states she is not using  but plans to begin doing so. Client View Of His/Her Situation:: Doing well.  After watching demo of sock aide patient realizes that this may be a very helpful tool for her to use. Next Visit Plan:: problem solve goal 2:  improve safety and independence with getting in and out of car. Heather [name] Jason, MHA, OT/L [phone]

## 2024-04-20 ENCOUNTER — Other Ambulatory Visit: Admitting: *Deleted

## 2024-04-20 NOTE — Patient Outreach (Signed)
 Aging Gracefully Program  04/20/2024  Heather Wilcox Oct 06, 1940 [id number]   Message received from [facility] (sister-in-law) stating today's joint home visit has to be cancelled and rescheduled for another time. [name] brother has a medical emergency.   Writer will contact next week to reschedule.    Pablo Hurst, MSN, RN, BSN [place]  [facility], Healthy Communities RN Case Manager for Aging Gracefully Direct Dial: [phone]

## 2024-05-12 ENCOUNTER — Other Ambulatory Visit: Admitting: *Deleted

## 2024-05-12 ENCOUNTER — Encounter: Admitting: *Deleted

## 2024-05-12 ENCOUNTER — Encounter: Payer: Self-pay | Admitting: *Deleted

## 2024-05-12 NOTE — Patient Outreach (Signed)
 [facility]  RN Visit  05/12/2024  Heather Wilcox 1940/06/26 [id number]  Visit:  RN Visit Number: 1- Initial Visit  RN TIME CALCULATION: Start TIme:  RN Start Time Calculation: 1100 End Time:  RN Stop Time Calculation: 1345 Total Minutes:  RN Time Calculation: 165  Readiness To Change Score:  Readiness to Change Score: 10  Universal RN Interventions: Calendar Distribution: Yes Exercise Review: No Medications: Yes Medication Changes: No Mood: Yes Pain: Yes PCP Advocacy/Support: No Fall Prevention: Yes Incontinence: Yes Clinician View Of Client Situation: Arrived for joint home visit with brother in law Mr. Heather Wilcox. Sister, Heather Wilcox and small well behaved dog present as well. Aluminum ramp in place. Home neat and clean. Ms. Heather Wilcox ambulates independently without assistive device. Client View Of His/Her Situation: Ms. Heather Wilcox denies pain. Reports wanting to stop smoking.  Healthcare Provider Communication: Did Surveyor, [employer] Provider?: No Healthcare Provider Response According to RN: n/a According to Client, Did PCP Report Communication With An Aging Gracefully RN?: No Healthcare Provider Response According To Client: n/a  Clinician View of Client Situation: Clinician View Of Client Situation: Arrived for joint home visit with brother in law Mr. Heather Wilcox. Sister, Heather Wilcox and small well behaved dog present as well. Aluminum ramp in place. Home neat and clean. Ms. Heather Wilcox ambulates independently without assistive device. Client's View of His/Her Situation: Client View Of His/Her Situation: Ms. Heather Wilcox denies pain. Reports wanting to stop smoking.  Medication Assessment: Do You Have Any Problems Paying For Medications?: No Where Does Client Store Medications?: Other: (bedroom) Can Client Read Pill Bottles?: Yes Does Client Use A Pillbox?: No Does Anyone Assist Client In Taking Medications?: No Do You Take Vitamin D?: Yes Does Client Have  Any Questions Or Concerns About Medictions?: No Is Client Complaining Of Any Symptoms That Could Be Side Effects To Medications?: No Any Possible Changes In Medication Regimen?: No  OT Update: ongoing  Session Summary: Ms. Heather Wilcox is very engaging and receptive..   Goals Addressed               This Visit's Progress     AG RN (pt-stated)        05/12/24  Assessment: Ms. Heather Wilcox denies any pain. She reports being pretty active. Ambulates without assistive device.  Lives with brother-in-law and sister. Goes to [employer]. Reports smoking 3 cigarettes a day. Reports smoking when she drinks her coffee. Reports wanting to stop smoking. Reports smoking has been a habit for 20 years.  Interventions: Provided chronic conditions booklet and contact information. Discussed smoking cessation strategies. Encouraged Ms. Heather Wilcox to consider joining Social Worker program with local [employer].  Plan: Scheduled next home visit on 06/16/24 at 12noon.   CLIENT/RN ACTION PLAN - STOP SMOKING  Registered Nurse:  Pablo Hurst   Date: 05/12/24  Client Name: Heather Wilcox   Client ID:    Target Area:  STOP SMOKING    What does client want to be able to do? Wants to stop smoking    Years smoking: 20 smoker   Packs per day: 3 cigarettes a day   STRATEGIES Ideas Strategies  Why Quit? Save money Feel better You and your family will be more healthy. Set a good example for your children and grandchildren.   Risks to You Death Cancer:  lung, colon, bladder, mouth, stomach and pancreas Arthritis Asthma, couch and Chronic Obstructive Pulmonary Disease Heart Disease including high blood pressure  Risks to Your Family Cancer Asthma, allergies and pneumonia Heart Disease  Ear Infections   Be Patient Yourself Many people try to quit several times before they are successful. If you slip and do smoke, don't get mad at yourself, try to stop again the next day.    How to Quit Smoking Many people  who quit use a lot of different tools at the same time: Support group Medications Nicotine patches or Nicotine gum Support from family and friends    Set a Quit Date A quit date gives you a goal to reach. It is a promise to yourself and your lived ones that you will quit. If you decide today that you want to quit, stet your quit date 2-3 weeks from now.     Medications Medications can decrease Nicotine cravings. Zyban, Wellbutrin, or Chantix are medicines people take to help quit smoking.  You need a prescription from your Primary Care Provider.   Nicotine Cravings Wait for craving to go away - be patient. Chew gum. Wear Nicotine patches. Take Medication.   Know your triggers. Common triggers At a party Boredom Stress Drinking alcohol Hanging out with friends who smoke   Dealing with Triggers  Distract yourself, go for a walk or exercise. Avoid friends whom smoke. Pray Listen to music. Take a bath. Wait for the urge to pass. Take slow deep breaths until the urge for cigarette goes away.   Get Support Support groups Family and friends Smoking quit line, 920-028-9347 [facility]) CDC help quit smoking 678-726-3168 [place] offers QuitSmart smoking cessation classes call 530-028-9018 to register   More information Pathways to Freedom by the CDC  mysteryraffle.it quit/pathways/ Clearing the Air by the [employer], NIH http://www.smokefree.gove/pubs/Clearing_the_Air_508.pdf http://www.smokefree/gov/   Other    PRACTICE It is important to practice the strategies so we can determine if they will be effective in helping to reach the goal.    Follow these specific recommendations:        If strategy does not work the first time, try it again.     We may make some changes over the next few sessions.      Pablo Hurst, MSN, RN, BSN [place]  [facility], [facility] Commun[facility] RN  Care Manager Direct Dial: [phone]                     Pablo Hurst, MSN, RN, BSN   [facility], Healthy Communities RN Case Manager for Aging [facility] Direct Dial: [phone]

## 2024-05-12 NOTE — Patient Instructions (Signed)
 Visit Information  Thank you for taking time to visit with me today. Please don't hesitate to contact me if I can be of assistance to you before our next scheduled home appointment.  Following are the goals we discussed today:   Goals Addressed               This Visit's Progress     AG RN (pt-stated)        05/12/24  Assessment: Ms. Geisel denies any pain. She reports being pretty active. Ambulates without assistive device.  Lives with brother-in-law and sister. Goes to [employer]. Reports smoking 3 cigarettes a day. Reports smoking when she drinks her coffee. Reports wanting to stop smoking. Reports smoking has been a habit for 20 years.  Interventions: Provided chronic conditions booklet and contact information. Discussed smoking cessation strategies. Encouraged Ms. Blanks to consider joining Social Worker program with local [employer].  Plan: Scheduled next home visit on 06/16/24 at 12noon.   CLIENT/RN ACTION PLAN - STOP SMOKING  Registered Nurse:  Pablo Hurst   Date: 05/12/24  Client Name: Donia Durenda   Client ID:    Target Area:  STOP SMOKING    What does client want to be able to do? Wants to stop smoking    Years smoking: 20 smoker   Packs per day: 3 cigarettes a day   STRATEGIES Ideas Strategies  Why Quit? Save money Feel better You and your family will be more healthy. Set a good example for your children and grandchildren.   Risks to You Death Cancer:  lung, colon, bladder, mouth, stomach and pancreas Arthritis Asthma, couch and Chronic Obstructive Pulmonary Disease Heart Disease including high blood pressure  Risks to Your Family Cancer Asthma, allergies and pneumonia Heart Disease Ear Infections   Be Patient Yourself Many people try to quit several times before they are successful. If you slip and do smoke, don't get mad at yourself, try to stop again the next day.    How to Quit Smoking Many people who quit use a lot of different tools at  the same time: Support group Medications Nicotine patches or Nicotine gum Support from family and friends    Set a Quit Date A quit date gives you a goal to reach. It is a promise to yourself and your lived ones that you will quit. If you decide today that you want to quit, stet your quit date 2-3 weeks from now.     Medications Medications can decrease Nicotine cravings. Zyban, Wellbutrin, or Chantix are medicines people take to help quit smoking.  You need a prescription from your Primary Care Provider.   Nicotine Cravings Wait for craving to go away - be patient. Chew gum. Wear Nicotine patches. Take Medication.   Know your triggers. Common triggers At a party Boredom Stress Drinking alcohol Hanging out with friends who smoke   Dealing with Triggers  Distract yourself, go for a walk or exercise. Avoid friends whom smoke. Pray Listen to music. Take a bath. Wait for the urge to pass. Take slow deep breaths until the urge for cigarette goes away.   Get Support Support groups Family and friends Smoking quit line, 424-370-7717 [facility]) CDC help quit smoking (223)026-5413 [place] offers QuitSmart smoking cessation classes call (928)335-6074 to register   More information Pathways to Freedom by the CDC  mysteryraffle.it quit/pathways/ Clearing the Air by the [employer], NIH http://www.smokefree.gove/pubs/Clearing_the_Air_508.pdf http://www.smokefree/gov/   Other    PRACTICE It is important  to practice the strategies so we can determine if they will be effective in helping to reach the goal.    Follow these specific recommendations:        If strategy does not work the first time, try it again.     We may make some changes over the next few sessions.      Pablo Hurst, MSN, RN, BSN [facility], Healthy Communities RN Care Manager Direct Dial: [phone]                         Our next appointment is on February 18 at 12noon.  If you are experiencing a Mental Health or Behavioral Health Crisis or need someone to talk to, please call the Suicide and Crisis Lifeline: 988 call the [place]  National Suicide Prevention Lifeline: (226)621-2301 or TTY: 269-096-9484 TTY 970-281-6403) to talk to a trained counselor call 1-800-273-TALK (toll free, 24 hour hotline) go to [facility] [address], [place] [phone]) call 911   The patient verbalized understanding of instructions, educational materials, and care plan provided today and agreed to receive a mailed copy of patient instructions, educational materials, and care plan.   Pablo Hurst, MSN, RN, BSN [place]  [facility], Healthy Communities RN Case Manager for Aging [facility]fully Direct Dial: [phone]

## 2024-05-17 NOTE — Telephone Encounter (Signed)
 Submitted PA renewal request via CMM, status is pending.

## 2024-05-28 ENCOUNTER — Encounter: Payer: Self-pay | Admitting: Specialist

## 2024-06-03 ENCOUNTER — Encounter: Admitting: Specialist

## 2024-06-03 NOTE — Telephone Encounter (Signed)
 Auth#: [id number] (05/17/24-08/15/24)

## 2024-06-23 ENCOUNTER — Ambulatory Visit: Admitting: Neurology

## 2024-06-30 ENCOUNTER — Ambulatory Visit: Admitting: Neurology

## 2024-07-19 ENCOUNTER — Other Ambulatory Visit (HOSPITAL_BASED_OUTPATIENT_CLINIC_OR_DEPARTMENT_OTHER)
